# Patient Record
Sex: Male | Born: 1935 | Race: White | Hispanic: No | Marital: Married | Smoking: Former smoker
Health system: Southern US, Community
[De-identification: ages and names within clinical notes are randomized; demographics above are authoritative.]

## PROBLEM LIST (undated history)

## (undated) DIAGNOSIS — K3182 Dieulafoy lesion (hemorrhagic) of stomach and duodenum: Secondary | ICD-10-CM

## (undated) DIAGNOSIS — I10 Essential (primary) hypertension: Secondary | ICD-10-CM

## (undated) DIAGNOSIS — C4490 Unspecified malignant neoplasm of skin, unspecified: Secondary | ICD-10-CM

## (undated) DIAGNOSIS — Z9289 Personal history of other medical treatment: Secondary | ICD-10-CM

## (undated) DIAGNOSIS — IMO0002 Reserved for concepts with insufficient information to code with codable children: Secondary | ICD-10-CM

## (undated) DIAGNOSIS — I8393 Asymptomatic varicose veins of bilateral lower extremities: Secondary | ICD-10-CM

## (undated) DIAGNOSIS — C649 Malignant neoplasm of unspecified kidney, except renal pelvis: Secondary | ICD-10-CM

## (undated) DIAGNOSIS — D649 Anemia, unspecified: Secondary | ICD-10-CM

## (undated) DIAGNOSIS — I2510 Atherosclerotic heart disease of native coronary artery without angina pectoris: Secondary | ICD-10-CM

## (undated) DIAGNOSIS — C801 Malignant (primary) neoplasm, unspecified: Secondary | ICD-10-CM

## (undated) DIAGNOSIS — Z905 Acquired absence of kidney: Secondary | ICD-10-CM

## (undated) DIAGNOSIS — R7989 Other specified abnormal findings of blood chemistry: Secondary | ICD-10-CM

## (undated) DIAGNOSIS — M1990 Unspecified osteoarthritis, unspecified site: Secondary | ICD-10-CM

## (undated) HISTORY — DX: Malignant (primary) neoplasm, unspecified: C80.1

## (undated) HISTORY — PX: NEPHRECTOMY: SHX65

## (undated) HISTORY — PX: COLONOSCOPY: SHX174

---

## 1960-03-31 DIAGNOSIS — A159 Respiratory tuberculosis unspecified: Secondary | ICD-10-CM

## 1960-03-31 HISTORY — DX: Respiratory tuberculosis unspecified: A15.9

## 1998-09-14 ENCOUNTER — Ambulatory Visit (HOSPITAL_COMMUNITY): Admission: RE | Admit: 1998-09-14 | Discharge: 1998-09-14 | Payer: Self-pay | Admitting: Radiology

## 1998-09-14 ENCOUNTER — Encounter: Payer: Self-pay | Admitting: Radiology

## 1998-10-24 ENCOUNTER — Encounter: Payer: Self-pay | Admitting: Radiology

## 1998-10-24 ENCOUNTER — Ambulatory Visit (HOSPITAL_COMMUNITY): Admission: RE | Admit: 1998-10-24 | Discharge: 1998-10-24 | Payer: Self-pay | Admitting: Radiology

## 1999-07-09 ENCOUNTER — Encounter: Payer: Self-pay | Admitting: Neurological Surgery

## 1999-07-09 ENCOUNTER — Ambulatory Visit (HOSPITAL_COMMUNITY): Admission: RE | Admit: 1999-07-09 | Discharge: 1999-07-09 | Payer: Self-pay | Admitting: *Deleted

## 1999-07-24 ENCOUNTER — Ambulatory Visit (HOSPITAL_COMMUNITY): Admission: RE | Admit: 1999-07-24 | Discharge: 1999-07-24 | Payer: Self-pay | Admitting: *Deleted

## 1999-07-24 ENCOUNTER — Encounter: Payer: Self-pay | Admitting: Neurological Surgery

## 1999-11-13 ENCOUNTER — Encounter: Admission: RE | Admit: 1999-11-13 | Discharge: 1999-11-13 | Payer: Self-pay | Admitting: *Deleted

## 1999-11-13 ENCOUNTER — Encounter (HOSPITAL_BASED_OUTPATIENT_CLINIC_OR_DEPARTMENT_OTHER): Payer: Self-pay | Admitting: Internal Medicine

## 1999-12-05 ENCOUNTER — Ambulatory Visit (HOSPITAL_COMMUNITY): Admission: RE | Admit: 1999-12-05 | Discharge: 1999-12-05 | Payer: Self-pay | Admitting: Gastroenterology

## 2002-02-11 ENCOUNTER — Ambulatory Visit (HOSPITAL_BASED_OUTPATIENT_CLINIC_OR_DEPARTMENT_OTHER): Admission: RE | Admit: 2002-02-11 | Discharge: 2002-02-11 | Payer: Self-pay | Admitting: Orthopedic Surgery

## 2004-03-31 HISTORY — PX: CORONARY ANGIOPLASTY WITH STENT PLACEMENT: SHX49

## 2005-05-15 ENCOUNTER — Ambulatory Visit (HOSPITAL_COMMUNITY): Admission: RE | Admit: 2005-05-15 | Discharge: 2005-05-16 | Payer: Self-pay | Admitting: Cardiology

## 2006-04-07 ENCOUNTER — Encounter: Admission: RE | Admit: 2006-04-07 | Discharge: 2006-04-07 | Payer: Self-pay | Admitting: Neurology

## 2006-04-07 IMAGING — CR DG CERVICAL SPINE COMPLETE 4+V
5 series · 5 of 5 positions shown · non-contrast
Comparison: none

CLINICAL DATA: C5-6 radiculopathy.
 CERVICAL SPINE ? 5 VIEW:

[w c-spine lat]
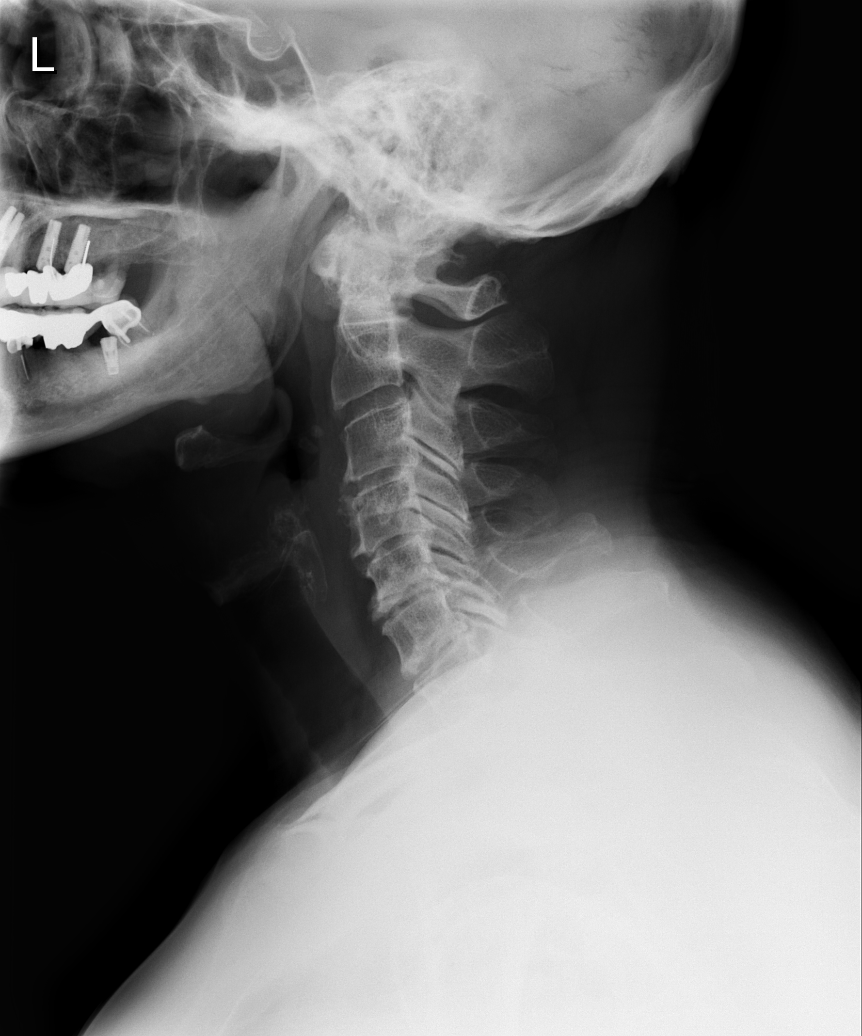

[w c-spine oblique (1 of 2)]
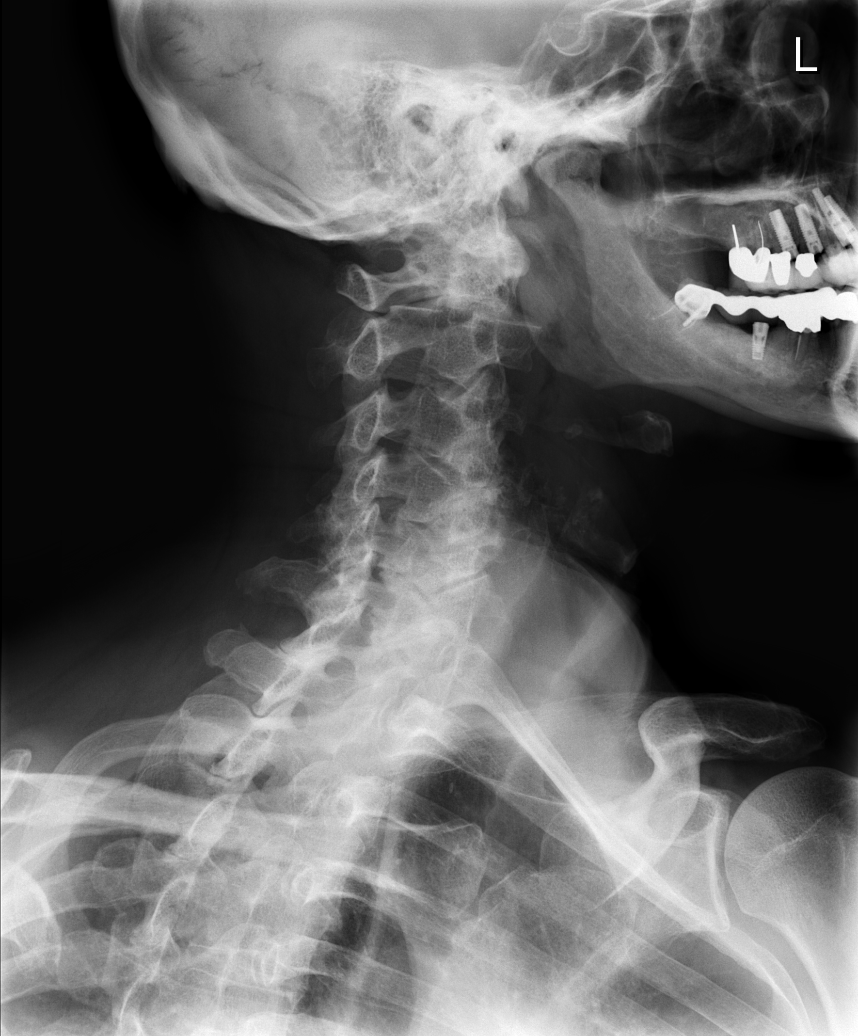

[w c-spine oblique (2 of 2)]
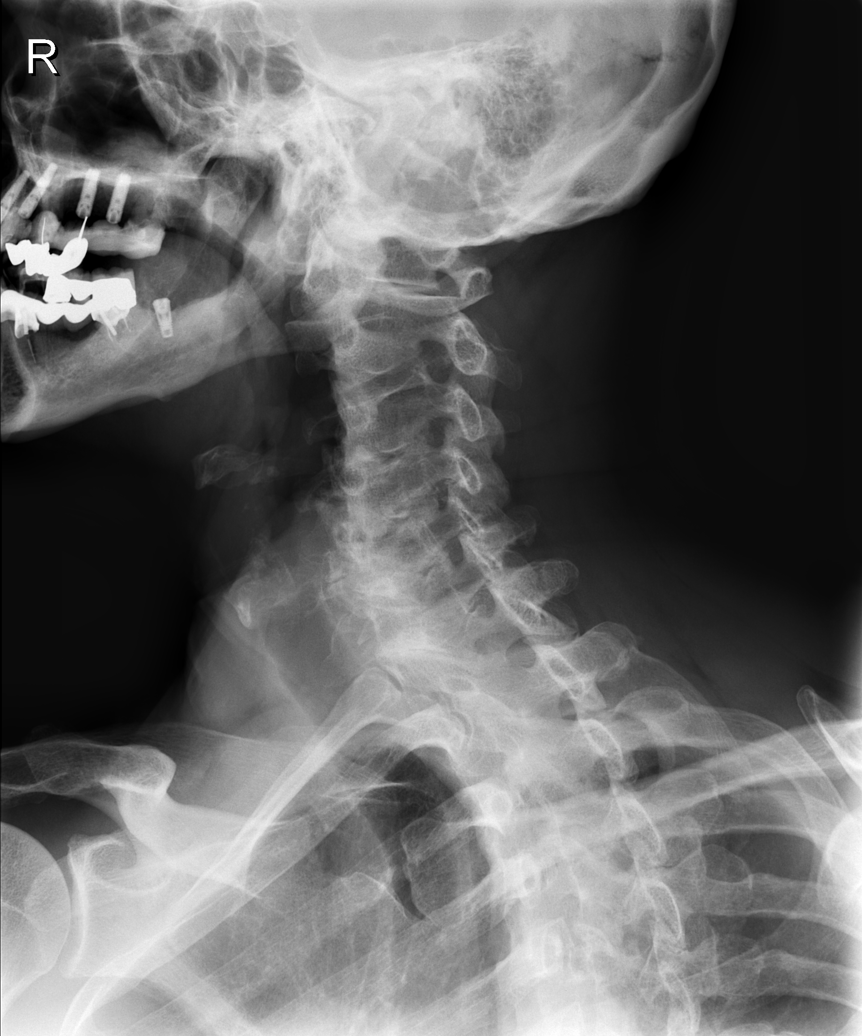

[w c-spine a.p. *]
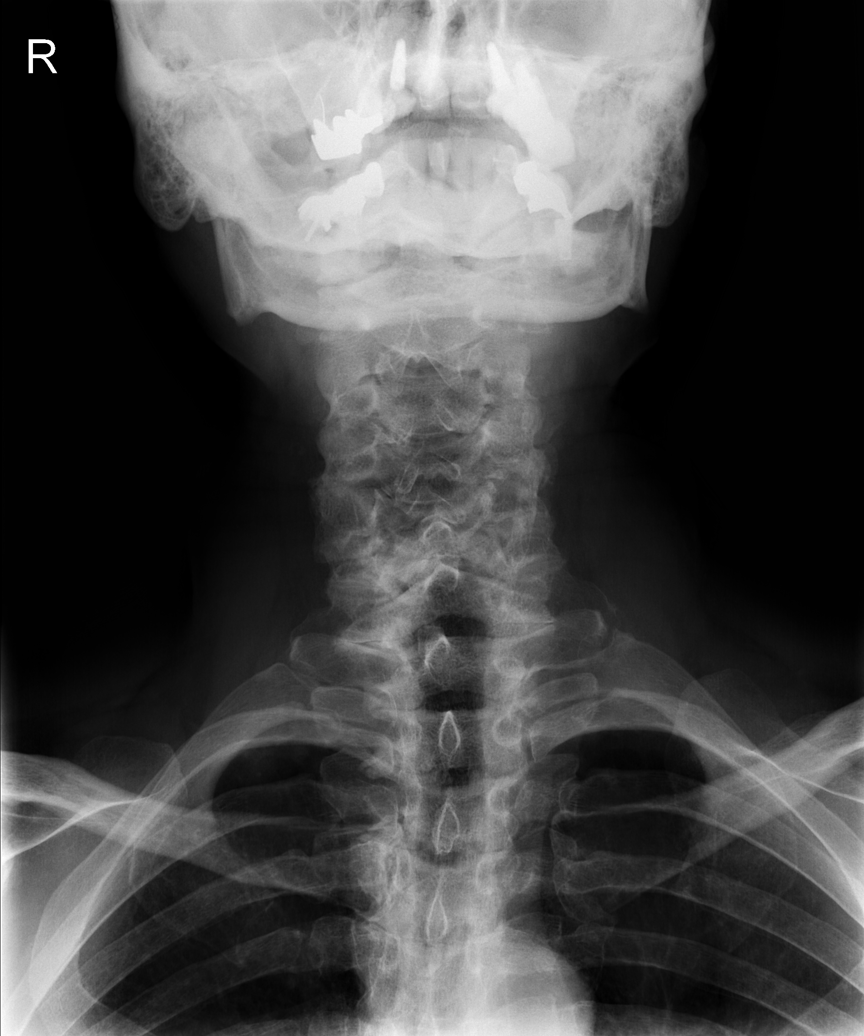

[w c-spine odontoid *]
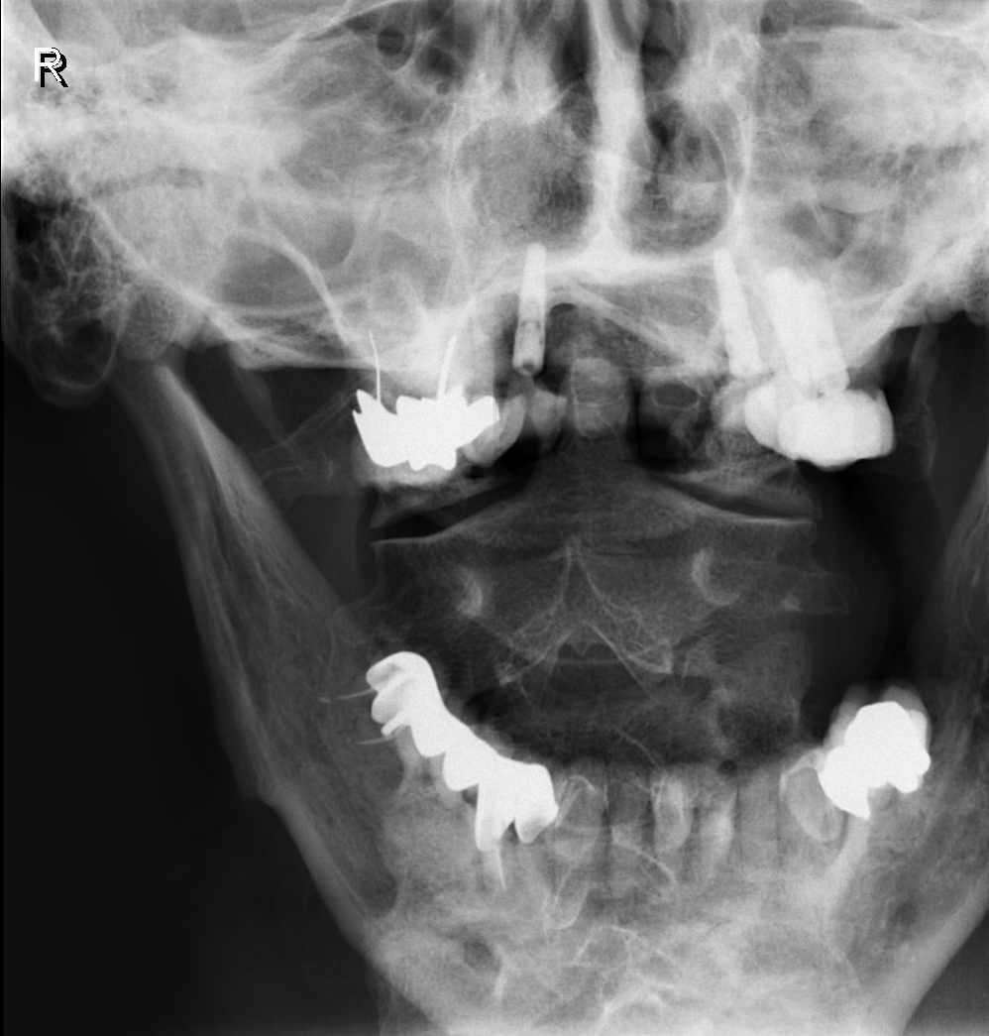

[5 of 5 positions shown; findings below may reference images not displayed]

FINDINGS: Five views of the cervical spine were obtained.  There is diffuse degenerative disc disease from C-3 to C-7 with loss of disc space, sclerosis, and spur formation.  On oblique views there is foraminal narrowing particularly at C4-5, C5-6, and C6-7 bilaterally.  The odontoid process is intact.
IMPRESSION: Diffuse degenerative disc disease from C-3 to C-4 with bilateral foraminal narrowing most marked at C4-5, C5-6, and C6-7.

## 2010-12-31 ENCOUNTER — Other Ambulatory Visit: Payer: Self-pay | Admitting: Dermatology

## 2011-04-14 ENCOUNTER — Ambulatory Visit (INDEPENDENT_AMBULATORY_CARE_PROVIDER_SITE_OTHER): Payer: Medicare Other | Admitting: General Surgery

## 2011-04-14 ENCOUNTER — Encounter (INDEPENDENT_AMBULATORY_CARE_PROVIDER_SITE_OTHER): Payer: Self-pay | Admitting: General Surgery

## 2011-04-14 VITALS — BP 150/68 | HR 72 | Temp 97.3°F | Resp 14 | Ht 66.0 in | Wt 186.0 lb

## 2011-04-14 DIAGNOSIS — K644 Residual hemorrhoidal skin tags: Secondary | ICD-10-CM | POA: Insufficient documentation

## 2011-04-14 NOTE — Progress Notes (Signed)
Patient ID: Glenn John., male   DOB: [birth year], 78 y.o.   MRN: [id number]  Chief Complaint  Patient presents with  . Other    hemorrhoids    HPI Glenn Hunt. is a 78 y.o. male.   HPI He is self-referred for evaluation of external hemorrhoids. Sometimes they are irritating and causing itching. Preparation H. helps with this. Sometimes they make it difficult for him to clean himself after a bowel movement. There is no bleeding and he has no significant pain from these. He is an avid cyclist. He is here to discuss options for treatment for this.  Past Medical History  Diagnosis Date  . Cancer     kidney  . Gout     Past Surgical History  Procedure Date  . Nephrectomy     History reviewed. No pertinent family history.  Social History History  Substance Use Topics  . Smoking status: Current Everyday Smoker  . Smokeless tobacco: Never Used  . Alcohol Use: Yes    Allergies  Allergen Reactions  . Tetracyclines & Related     Current Outpatient Prescriptions  Medication Sig Dispense Refill  . allopurinol (ZYLOPRIM) 100 MG tablet Take 150 mg by mouth daily.      [name] Kitchen aspirin 325 MG tablet Take 325 mg by mouth daily.      [name] Kitchen triamterene-hydrochlorothiazide (MAXZIDE) 75-50 MG per tablet Take 1 tablet by mouth daily.        Review of Systems Review of Systems  Constitutional: Negative for unexpected weight change.  HENT: Positive for hearing loss and congestion.     Blood pressure 150/68, pulse 72, temperature 97.3 F (36.3 C), temperature source Temporal, resp. rate 14, height 5\' 6"  (1.676 m), weight 186 lb (84.369 kg).  Physical Exam Physical Exam  Constitutional: He appears well-developed and well-nourished. No distress.  Genitourinary:       There are external hemorrhoids present in the right anterior and left lateral positions. No evidence of thrombosis. No fissure. The hemorrhoids are nontender. No masses or blood on digital rectal exam.    Data  Reviewed none  Assessment    External hemorrhoids that intermittently itch and caused difficulty with hygiene at times.    Plan    We discussed options for this. The radical option, in my opinion, would be external hemorrhoidectomy and he is not particularly interested in that at this time. We also talked about a sitz bath apparatus, a bidet, or continuing with his current method of hygiene. He has decided to continue as he currently is and will call back if he would like to reconsider other modes of treatment.      Dorothy Polhemus J 04/14/2011, 10:33 AM

## 2011-04-14 NOTE — Patient Instructions (Signed)
Call if you have any further problems.

## 2011-07-09 ENCOUNTER — Encounter (INDEPENDENT_AMBULATORY_CARE_PROVIDER_SITE_OTHER): Payer: Self-pay

## 2011-11-14 ENCOUNTER — Other Ambulatory Visit: Payer: Self-pay | Admitting: Dermatology

## 2011-12-04 ENCOUNTER — Other Ambulatory Visit: Payer: Self-pay | Admitting: Dermatology

## 2012-03-09 ENCOUNTER — Other Ambulatory Visit: Payer: Self-pay | Admitting: Dermatology

## 2012-04-20 ENCOUNTER — Encounter (HOSPITAL_COMMUNITY): Payer: Self-pay | Admitting: Emergency Medicine

## 2012-04-20 ENCOUNTER — Inpatient Hospital Stay (HOSPITAL_COMMUNITY)
Admission: EM | Admit: 2012-04-20 | Discharge: 2012-04-21 | DRG: 378 | Disposition: A | Discharge status: Home / Self Care | Payer: Medicare Other | Attending: Internal Medicine | Admitting: Internal Medicine

## 2012-04-20 ENCOUNTER — Encounter (HOSPITAL_COMMUNITY)
Admission: EM | Disposition: A | Discharge status: Home / Self Care | Payer: Self-pay | Source: Home / Self Care | Attending: Internal Medicine

## 2012-04-20 ENCOUNTER — Emergency Department (HOSPITAL_COMMUNITY): Payer: Medicare Other

## 2012-04-20 DIAGNOSIS — Z9861 Coronary angioplasty status: Secondary | ICD-10-CM

## 2012-04-20 DIAGNOSIS — M129 Arthropathy, unspecified: Secondary | ICD-10-CM | POA: Diagnosis present

## 2012-04-20 DIAGNOSIS — Z7982 Long term (current) use of aspirin: Secondary | ICD-10-CM

## 2012-04-20 DIAGNOSIS — Z85528 Personal history of other malignant neoplasm of kidney: Secondary | ICD-10-CM

## 2012-04-20 DIAGNOSIS — F17200 Nicotine dependence, unspecified, uncomplicated: Secondary | ICD-10-CM | POA: Diagnosis present

## 2012-04-20 DIAGNOSIS — K3182 Dieulafoy lesion (hemorrhagic) of stomach and duodenum: Principal | ICD-10-CM | POA: Diagnosis present

## 2012-04-20 DIAGNOSIS — R079 Chest pain, unspecified: Secondary | ICD-10-CM | POA: Insufficient documentation

## 2012-04-20 DIAGNOSIS — E876 Hypokalemia: Secondary | ICD-10-CM | POA: Diagnosis present

## 2012-04-20 DIAGNOSIS — Z905 Acquired absence of kidney: Secondary | ICD-10-CM | POA: Insufficient documentation

## 2012-04-20 DIAGNOSIS — D649 Anemia, unspecified: Secondary | ICD-10-CM | POA: Diagnosis present

## 2012-04-20 DIAGNOSIS — N183 Chronic kidney disease, stage 3 unspecified: Secondary | ICD-10-CM | POA: Diagnosis present

## 2012-04-20 DIAGNOSIS — K922 Gastrointestinal hemorrhage, unspecified: Secondary | ICD-10-CM | POA: Diagnosis present

## 2012-04-20 DIAGNOSIS — D62 Acute posthemorrhagic anemia: Secondary | ICD-10-CM | POA: Diagnosis present

## 2012-04-20 DIAGNOSIS — Z79899 Other long term (current) drug therapy: Secondary | ICD-10-CM

## 2012-04-20 DIAGNOSIS — I2510 Atherosclerotic heart disease of native coronary artery without angina pectoris: Secondary | ICD-10-CM | POA: Diagnosis present

## 2012-04-20 DIAGNOSIS — M109 Gout, unspecified: Secondary | ICD-10-CM | POA: Diagnosis present

## 2012-04-20 HISTORY — DX: Atherosclerotic heart disease of native coronary artery without angina pectoris: I25.10

## 2012-04-20 HISTORY — PX: ESOPHAGOGASTRODUODENOSCOPY: SHX5428

## 2012-04-20 LAB — CBC
HCT: 19.2 % — ABNORMAL LOW (ref 39.0–52.0)
HCT: 19.2 % — ABNORMAL LOW (ref 39.0–52.0)
Hemoglobin: 5.3 g/dL — CL (ref 13.0–17.0)
Hemoglobin: 6.7 g/dL — CL (ref 13.0–17.0)
MCH: 32.8 pg (ref 26.0–34.0)
MCHC: 34.9 g/dL (ref 30.0–36.0)
MCV: 102 fL — ABNORMAL HIGH (ref 78.0–100.0)
MCV: 94.1 fL (ref 78.0–100.0)
Platelets: 163 10*3/uL (ref 150–400)
RBC: 1.53 MIL/uL — ABNORMAL LOW (ref 4.22–5.81)
RBC: 2.04 MIL/uL — ABNORMAL LOW (ref 4.22–5.81)
RDW: 18.1 % — ABNORMAL HIGH (ref 11.5–15.5)
WBC: 9.5 10*3/uL (ref 4.0–10.5)

## 2012-04-20 LAB — PREPARE RBC (CROSSMATCH)

## 2012-04-20 LAB — BASIC METABOLIC PANEL
CO2: 25 mEq/L (ref 19–32)
Calcium: 8.6 mg/dL (ref 8.4–10.5)
Chloride: 103 mEq/L (ref 96–112)
Potassium: 3 mEq/L — ABNORMAL LOW (ref 3.5–5.1)
Sodium: 137 mEq/L (ref 135–145)

## 2012-04-20 LAB — MRSA PCR SCREENING: MRSA by PCR: NEGATIVE

## 2012-04-20 LAB — POCT I-STAT TROPONIN I: Troponin i, poc: 0.03 ng/mL (ref 0.00–0.08)

## 2012-04-20 LAB — TSH: TSH: 1.915 u[IU]/mL (ref 0.350–4.500)

## 2012-04-20 LAB — OCCULT BLOOD, POC DEVICE: Fecal Occult Bld: POSITIVE — AB

## 2012-04-20 LAB — ABO/RH: ABO/RH(D): O POS

## 2012-04-20 IMAGING — CR DG CHEST 1V PORT
1 series · 1 of 1 positions shown · non-contrast
Comparison: None.

CLINICAL DATA: Cough and shortness of breath.

PORTABLE CHEST - 1 VIEW

[AP]
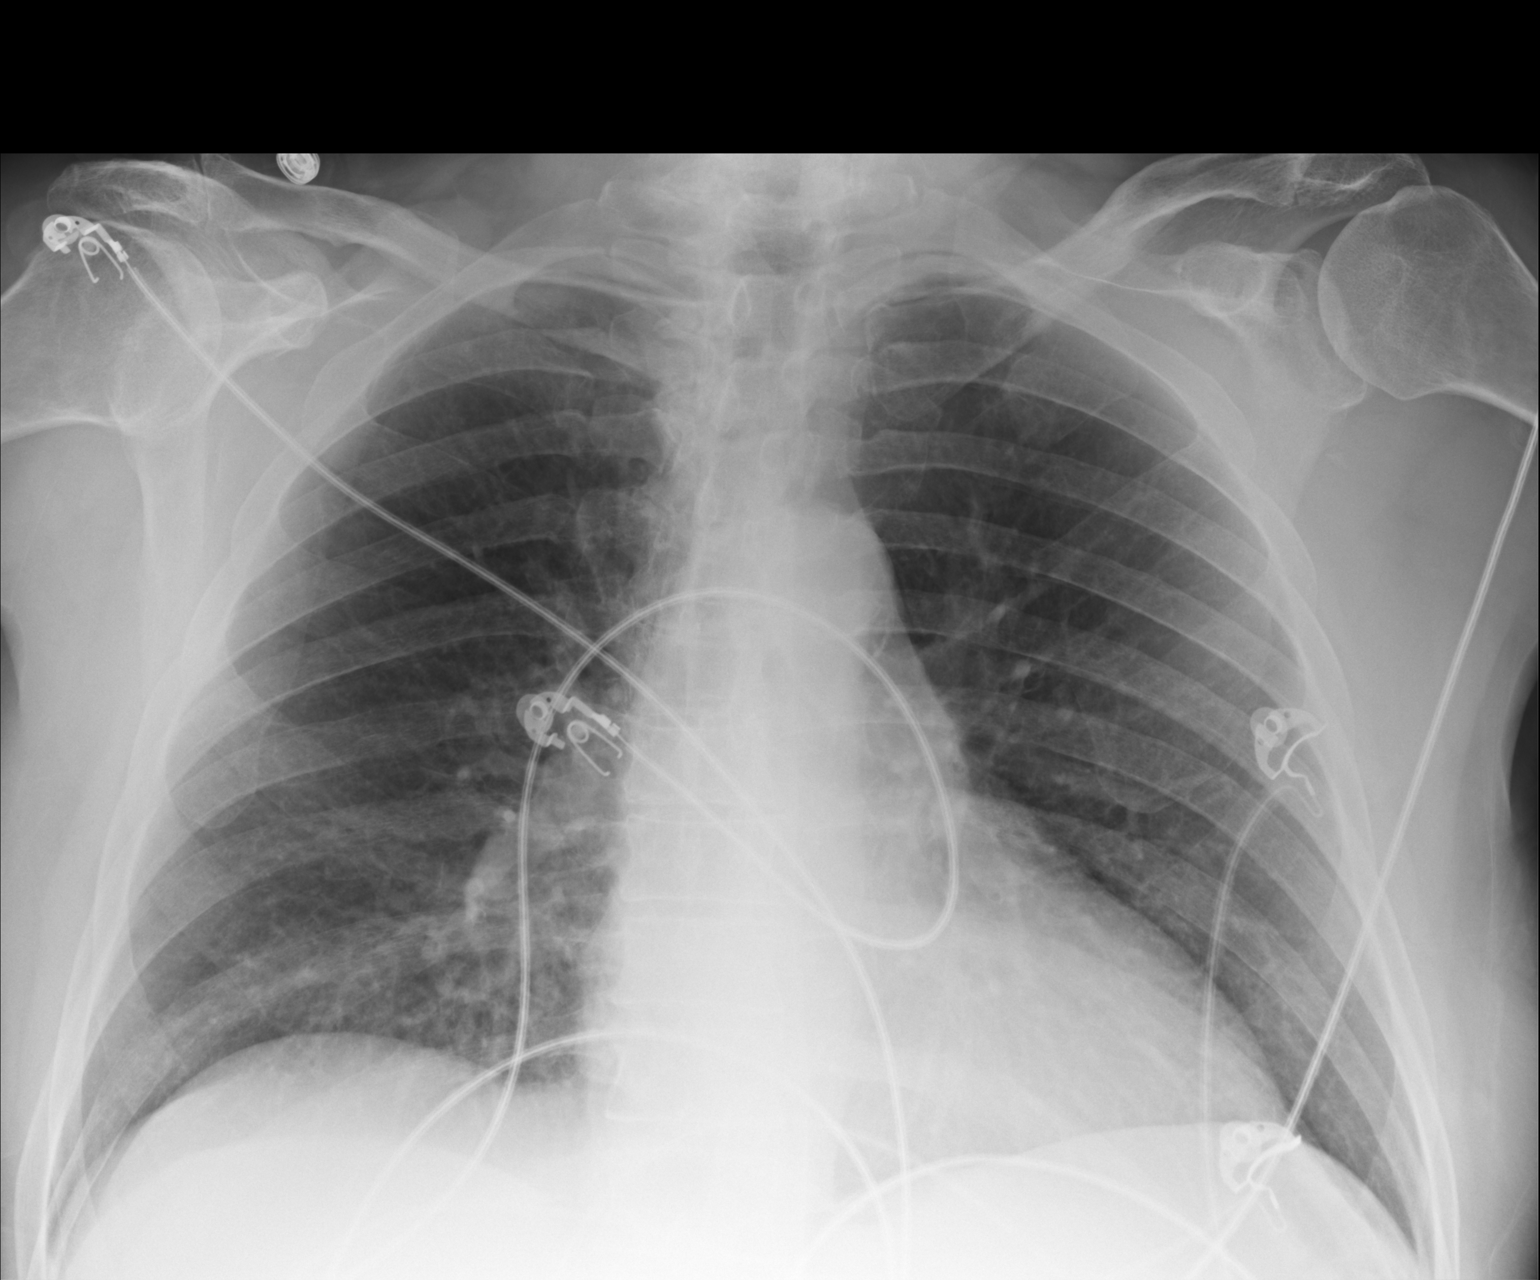

[1 of 1 positions shown; findings below may reference images not displayed]

FINDINGS: Single view of the chest was obtained.  There may be
mild peribronchial thickening or even bronchiectasis in the medial
right lung base.  Otherwise, lungs are clear.  Heart size is
normal.  Trachea is midline.
IMPRESSION: Question some bronchiectasis at the right lung base.
No focal airspace disease.

## 2012-04-20 SURGERY — EGD (ESOPHAGOGASTRODUODENOSCOPY)
Anesthesia: Moderate Sedation

## 2012-04-20 MED ORDER — BUTAMBEN-TETRACAINE-BENZOCAINE 2-2-14 % EX AERO
INHALATION_SPRAY | CUTANEOUS | Status: DC | PRN
  Administered 2012-04-20: 2 via TOPICAL

## 2012-04-20 MED ORDER — SODIUM CHLORIDE 0.9 % IV SOLN: INTRAVENOUS | Status: DC

## 2012-04-20 MED ORDER — DEXTROSE-NACL 5-0.9 % IV SOLN
INTRAVENOUS | Status: DC
  Administered 2012-04-20: 07:00:00 via INTRAVENOUS
  Administered 2012-04-20: 1000 mL via INTRAVENOUS
  Administered 2012-04-21: 01:00:00 via INTRAVENOUS

## 2012-04-20 MED ORDER — FENTANYL CITRATE 0.05 MG/ML IJ SOLN
INTRAMUSCULAR | Status: DC | PRN
  Administered 2012-04-20 (×4): 25 ug via INTRAVENOUS

## 2012-04-20 MED ORDER — EPINEPHRINE HCL 0.1 MG/ML IJ SOLN
INTRAMUSCULAR | Status: AC
  Filled 2012-04-20: qty 10

## 2012-04-20 MED ORDER — DIPHENHYDRAMINE HCL 50 MG/ML IJ SOLN
INTRAMUSCULAR | Status: AC
  Filled 2012-04-20: qty 1

## 2012-04-20 MED ORDER — SODIUM CHLORIDE 0.9 % IV SOLN
8.0000 mg/h | INTRAVENOUS | Status: DC
  Administered 2012-04-20 – 2012-04-21 (×3): 8 mg/h via INTRAVENOUS
  Filled 2012-04-20 (×6): qty 80

## 2012-04-20 MED ORDER — ONDANSETRON HCL 4 MG/2ML IJ SOLN: 4.0000 mg | Freq: Four times a day (QID) | INTRAMUSCULAR | Status: DC | PRN

## 2012-04-20 MED ORDER — POTASSIUM CHLORIDE 10 MEQ/100ML IV SOLN
10.0000 meq | INTRAVENOUS | Status: AC
  Administered 2012-04-20 (×4): 10 meq via INTRAVENOUS
  Filled 2012-04-20 (×4): qty 100

## 2012-04-20 MED ORDER — FENTANYL CITRATE 0.05 MG/ML IJ SOLN
INTRAMUSCULAR | Status: AC
  Filled 2012-04-20: qty 2

## 2012-04-20 MED ORDER — SODIUM CHLORIDE 0.9 % IV SOLN
80.0000 mg | Freq: Once | INTRAVENOUS | Status: AC
  Administered 2012-04-20: 80 mg via INTRAVENOUS
  Filled 2012-04-20: qty 80

## 2012-04-20 MED ORDER — ONDANSETRON HCL 4 MG PO TABS: 4.0000 mg | ORAL_TABLET | Freq: Four times a day (QID) | ORAL | Status: DC | PRN

## 2012-04-20 MED ORDER — SODIUM CHLORIDE 0.9 % IJ SOLN
3.0000 mL | Freq: Two times a day (BID) | INTRAMUSCULAR | Status: DC
  Administered 2012-04-20: 10 mL via INTRAVENOUS
  Administered 2012-04-21: 3 mL via INTRAVENOUS

## 2012-04-20 MED ORDER — EPINEPHRINE HCL 1 MG/ML IJ SOLN
INTRAMUSCULAR | Status: DC | PRN
  Administered 2012-04-20: 13:00:00

## 2012-04-20 MED ORDER — MIDAZOLAM HCL 10 MG/2ML IJ SOLN
INTRAMUSCULAR | Status: DC | PRN
  Administered 2012-04-20 (×4): 2 mg via INTRAVENOUS

## 2012-04-20 MED ORDER — MIDAZOLAM HCL 5 MG/ML IJ SOLN
INTRAMUSCULAR | Status: AC
  Filled 2012-04-20: qty 2

## 2012-04-20 NOTE — Consult Note (Signed)
Subjective:   HPI The patient is a 79 year old male who was on vacation and started to feel very weak. On returning to [facility] he came to the emergency room because of weakness and he noticed a large black tarry stool. He is a retired Development worker, community. He realize what this was. He states that he has noticed some dark stools for a while. He was found to have a hemoglobin of 5.3. He was admitted to the hospital. He denied hematemesis. He has no complaints of epigastric pain or heartburn. He has never had an ulcer to his knowledge.  Review of Systems Complains of weakness and some dyspnea  Past Medical History  Diagnosis Date  . Cancer     kidney  . Gout    Past Surgical History  Procedure Date  . Nephrectomy   . Coronary angioplasty with stent placement 2006   History   Social History  . Marital Status: Married    Spouse Name: N/A    Number of Children: N/A  . Years of Education: N/A   Occupational History  . Not on file.   Social History Main Topics  . Smoking status: Current Every Day Smoker -- 0.2 packs/day    Types: Cigarettes  . Smokeless tobacco: Never Used  . Alcohol Use: 4.2 oz/week    7 Glasses of wine per week  . Drug Use: No  . Sexually Active: Yes   Other Topics Concern  . Not on file   Social History Narrative  . No narrative on file   family history is not on file. Current facility-administered medications:dextrose 5 %-0.9 % sodium chloride infusion, , Intravenous, Continuous, Houston Siren, MD, Last Rate: 100 mL/hr at 04/20/12 0641;  ondansetron [facility]) injection 4 mg, 4 mg, Intravenous, Q6H PRN, Houston Siren, MD;  ondansetron [facility]) tablet 4 mg, 4 mg, Oral, Q6H PRN, Houston Siren, MD pantoprazole (PROTONIX) 80 mg in sodium chloride 0.9 % 250 mL infusion, 8 mg/hr, Intravenous, Continuous, John-Adam Bonk, MD, Last Rate: 25 mL/hr at 04/20/12 0332, 8 mg/hr at 04/20/12 0332;  potassium chloride 10 mEq in 100 mL IVPB, 10 mEq, Intravenous, Q1 Hr x 4, Lillia Mountain, MD, 10  mEq at 04/20/12 0859;  sodium chloride 0.9 % injection 3 mL, 3 mL, Intravenous, Q12H, Houston Siren, MD Allergies  Allergen Reactions  . Tetracyclines & Related Other (See Comments)    Infection of penis     Objective:     BP 131/53  Pulse 75  Temp 98.2 F (36.8 C) (Oral)  Resp 20  Ht 5\' 7"  (1.702 m)  Wt 85.1 kg (187 lb 9.8 oz)  BMI 29.38 kg/m2  SpO2 99%  He is in no acute distress  Nonicteric  Heart regular rhythm no murmurs  Lungs clear  Abdomen is soft and nontender without hepatosplenomegaly  Laboratory No components found with this basename: d1      Assessment:     #1. Upper gastrointestinal bleeding characterized by melena  #2. Anemia secondary to upper GI bleed      Plan:     Transfuse blood  PPI therapy  EGD later today Lab Results  Component Value Date   HGB 6.7* 04/20/2012   HGB 5.3* 04/20/2012   HCT 19.2* 04/20/2012   HCT 15.6* 04/20/2012

## 2012-04-20 NOTE — Progress Notes (Signed)
Had difficulty with module operation at intition of blood. Blood administration slowed.  Demi Trieu, [facility]

## 2012-04-20 NOTE — ED Notes (Signed)
Patient transported by [facility] with complaint of tarry stools.  Patient states tarry stools started on Friday but became more frequent today.  Patient just returned from [place] yesterday morning.  Patient also complaining being dizzy and lightheaded for the last couple days.  EMS reports patient was orthostatic with 152/58 lying and 106/54 standing.

## 2012-04-20 NOTE — Op Note (Signed)
[facility] [address] [place], [address]   ENDOSCOPY PROCEDURE REPORT  PATIENT: [name], [name]  DR.  Montez Hageman  MR#: [id number] BIRTHDATE: December 23, 1931 , 80  yrs. old GENDER: Male ENDOSCOPIST: Wandalee Ferdinand, MD REFERRED BY: PROCEDURE DATE:  04/20/2012 PROCEDURE:   EGD with epinephrine injection and Endo Clip of a Dieulofoy lesion ASA CLASS: 3 INDICATIONS: melena, anemia MEDICATIONS: fentanyl 100 mcg IV, Versed 8 mg IV TOPICAL ANESTHETIC: Cetacaine spray  DESCRIPTION OF PROCEDURE:   After the risks benefits and alternatives of the procedure were thoroughly explained, informed consent was obtained.  The Pentax Gastroscope [id number]  endoscope was introduced through the mouth and advanced to the second portion of the duodenum      , limited by Without limitations.   The instrument was slowly withdrawn as the mucosa was fully examined.      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 The area was washed several times but blood continued to ooze. 2 cc of 1-10,000 epinephrine was injected into this area with adequate blanching of the surrounding mucosa and with cessation of the bleeding. An Endo Clip was then placed at this site to clamp the area. Bleeding stopped (see figure 10).  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   RECOMMENDATIONS: observe for further bleeding      _______________________________ Rhodia Albright, MD 04/20/2012 1:33 PM       PATIENT NAME:  Glenn Hunt  DR.  Montez Hageman MR#: [id number]

## 2012-04-20 NOTE — Progress Notes (Signed)
Utilization Review Completed.  04/20/2012  

## 2012-04-20 NOTE — ED Provider Notes (Signed)
History     CSN: [id number]  Arrival date & time 04/20/12  0019   First MD Initiated Contact with Patient 04/20/12 [phone]      Chief Complaint  Patient presents with  . Melena  . Chest Pain    (Consider location/radiation/quality/duration/timing/severity/associated sxs/prior treatment) HPIMurphy F Samuel Mcpeek. 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 Patient recently got back from a [place]. Patient arrived on last Wednesday evening and didn't feel well noticed some nausea and vomiting after ascending the stairs at the hotel also some occasional shortness of breath on exertion and chest pain that resolved on rest. He had gradually worsening weakness through the weekend and did not have any more indigestion or nausea or vomiting. Patient noticed he was more orthostatic and dizzy on standing over the course of the weekend and today. Patient had some occasional black stools and had a very large black bowel movement today. No hematemesis.    Gastroenterologist is Dr. Matthias Hughs - last scope 2.5 years ago.  Past Medical History  Diagnosis Date  . Cancer     kidney  . Gout     Past Surgical History  Procedure Date  . Nephrectomy   . Coronary angioplasty with stent placement 2006    No family history on file.  History  Substance Use Topics  . Smoking status: Current Every Day Smoker  . Smokeless tobacco: Never Used  . Alcohol Use: Yes      Review of Systems At least 10pt or greater review of systems completed and are negative except where specified in the HPI.  Allergies  Tetracyclines & related  Home Medications   Current Outpatient Rx  Name  Route  Sig  Dispense  Refill  . ALLOPURINOL 100 MG PO TABS   Oral   Take 150 mg by mouth daily.         . ASPIRIN 325 MG PO TABS   Oral   Take 325 mg by mouth daily.         . TRIAMTERENE-HCTZ 75-50 MG PO TABS   Oral   Take 1  tablet by mouth daily.           BP 118/80  Pulse 100  Temp 98.7 F (37.1 C) (Oral)  Resp 20  Ht 5\' 7"  (1.702 m)  Wt 182 lb (82.555 kg)  BMI 28.51 kg/m2  SpO2 98%  Physical Exam  Nursing notes reviewed.  Electronic medical record reviewed. VITAL SIGNS:   Filed Vitals:   04/20/12 0330 04/20/12 0345 04/20/12 0400 04/20/12 0415  BP: 122/48 134/60 141/54 126/52  Pulse: 92 100 99 90  Temp:      TempSrc:      Resp: 20 20 19 23   Height:      Weight:      SpO2:       CONSTITUTIONAL: Awake, oriented, appears non-toxic HENT: Atraumatic, normocephalic, oral mucosa pink and moist, airway patent. Nares patent without drainage. External ears normal. EYES: Conjunctiva clear- pale palpebral conjunctiva, EOMI, PERRLA NECK: Trachea midline, non-tender, supple CARDIOVASCULAR: Normal heart rate, Normal rhythm, late systolic murmur at the left sternal border, No rubs, gallops PULMONARY/CHEST: Good air movement bilaterally with scarce and expiratory wheezes. Symmetrical breath sounds. Non-tender. ABDOMINAL: Non-distended, soft, non-tender - no rebound or guarding.  BS normal. NEUROLOGIC: Non-focal, moving all four extremities, no gross sensory or motor deficits. Rectal: Normal tone, black stool EXTREMITIES:  No clubbing, cyanosis. 1+ pitting edema in the lower extremities SKIN: Warm, Dry, No erythema, No rash   ED Course  CRITICAL CARE Performed by: Jones Skene Authorized by: Jones Skene Total critical care time: 30 minutes Critical care time was exclusive of separately billable procedures and treating other patients. Critical care was necessary to treat or prevent imminent or life-threatening deterioration of the following conditions: Symptomatic anemia from upper GI bleed, coronary ischemia. Critical care was time spent personally by me on the following activities: examination of patient, ordering and performing treatments and interventions, ordering and review of radiographic  studies, re-evaluation of patient's condition, review of old charts, pulse oximetry, ordering and review of laboratory studies, obtaining history from patient or surrogate, evaluation of patient's response to treatment and discussions with consultants.   (including critical care time)  Date: 04/20/2012  Rate: 105  Rhythm: Sinus tachycardia  QRS Axis: normal  Intervals: normal  ST/T Wave abnormalities: ST depressions in 1 to V4 V5 V6  Conduction Disutrbances: none  Narrative Interpretation: Probable lateral ischemia     Labs Reviewed  BASIC METABOLIC PANEL - Abnormal; Notable for the following:    Potassium 3.0 (*)     Glucose, Bld 125 (*)     BUN 61 (*)     Creatinine, Ser 1.58 (*)     GFR calc non Af Amer 40 (*)     GFR calc Af Amer 47 (*)     All other components within normal limits  CBC - Abnormal; Notable for the following:    RBC 1.53 (*)     Hemoglobin 5.3 (*)     HCT 15.6 (*)     MCV 102.0 (*)     MCH 34.6 (*)     RDW 15.7 (*)     All other components within normal limits  OCCULT BLOOD, POC DEVICE - Abnormal; Notable for the following:    Fecal Occult Bld POSITIVE (*)     All other components within normal limits  TROPONIN I  TYPE AND SCREEN  POCT I-STAT TROPONIN I  PREPARE RBC (CROSSMATCH)  ABO/RH  APTT  PROTIME-INR   Dg Chest Port 1 View  04/20/2012  *RADIOLOGY REPORT*  Clinical Data: Cough and shortness of breath.  PORTABLE CHEST - 1 VIEW  Comparison: None.  Findings:   Single view of the chest was obtained.  There may be mild peribronchial thickening or even bronchiectasis in the medial right lung base.  Otherwise, lungs are clear.  Heart size is normal.  Trachea is midline.  IMPRESSION: Question some bronchiectasis at the right lung base. No focal airspace disease.   Original Report Authenticated By: Richarda Overlie, M.D.      1. Upper GI bleed   2. Anemia   3. Chest pain on exertion       MDM  Loki Wuthrich. is a 79 y.o. male returning from a  recent [place] whereby he was having some chest pain and shortness of breath also with some nausea and vomiting, patient's also had some melanotic stools. Patient has large amount of melena on physical exam. We'll treat patient's GI bleed.  Type and cross for 3. Patient's hemoglobin is critical at 5.3, patient is having difficulty even standing up because he is so orthostatic.  Given patient a small fluid bolus and will begin giving him 3 units of blood. Patient is only on aspirin-no other blood thinners noted.  Fecal occult blood is positive, troponin is negative. EKG  changes are consistent with symptomatic anemia - no evidence for end STEMI at this time. Resuscitation in progress. Patient is hemodynamically stable. Protonix drip started as well.    Discussed with Dr.Ganem from [facility] - to see in am.  Discussed with Dr. Conley Rolls from [facility] for admission.     Jones Skene, MD 04/20/12 [phone]

## 2012-04-20 NOTE — Progress Notes (Signed)
CRITICAL VALUE ALERT  Critical value received:  Troponin 0.33  Date of notification:  04-20-12  Time of notification:  1223  Critical value read back:yes  Nurse who received alert:  Paulino Rily  MD notified (1st page):  Valentina Lucks, MD  Time of first page:  1225  No new orders received.

## 2012-04-20 NOTE — H&P (Signed)
[facility] History and Physical  Ramondo Dietze. ZOX:[id number] DOB: 09/28/1931    PCP:   Lillia Mountain, MD   Chief Complaint: shortness of breath and chest pain.  HPI: Glenn Hunt. 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  He has been an avid cyclist and denied any prior exertional chest pain.  He has no black stool, or bloody stool, but had hx of external hemorrhoid.  Evaluation in the ER showed unremarkable EKG and negative troponin, but his Hb was found to be very low at 5.3 grams per DL. He has Cr of 1.6 with BUN of 61., but with K of 3.0 and Hospitalist was asked to admit him for upper GI bleed.   Rewiew of Systems:  Constitutional: Negative for malaise, fever and chills. No significant weight loss or weight gain Eyes: Negative for eye pain, redness and discharge, diplopia, visual changes, or flashes of light. ENMT: Negative for ear pain, hoarseness, nasal congestion, sinus pressure and sore throat. No headaches; tinnitus, drooling, or problem swallowing. Cardiovascular: Negative for palpitations, diaphoresis, dyspnea and peripheral edema. ; No orthopnea, PND Respiratory: Negative for cough, hemoptysis, wheezing and stridor. No pleuritic chestpain. Gastrointestinal: Negative for nausea, vomiting, diarrhea, constipation, abdominal pain, melena, blood in stool, hematemesis, jaundice and rectal bleeding.    Genitourinary: Negative for frequency, dysuria, incontinence,flank pain and hematuria; Musculoskeletal: Negative for back pain and neck pain. Negative for swelling and trauma.;  Skin: . Negative for pruritus, rash, abrasions, bruising and skin lesion.; ulcerations Neuro: Negative for headache, lightheadedness and neck stiffness. Negative for weakness, altered level of  consciousness , altered mental status, extremity weakness, burning feet, involuntary movement, seizure and syncope.  Psych: negative for anxiety, depression, insomnia, tearfulness, panic attacks, hallucinations, paranoia, suicidal or homicidal ideation   Past Medical History  Diagnosis Date  . Cancer     kidney  . Gout     Past Surgical History  Procedure Date  . Nephrectomy   . Coronary angioplasty with stent placement 2006    Medications:  HOME MEDS: Prior to Admission medications   Medication Sig Start Date End Date Taking? Authorizing Provider  allopurinol (ZYLOPRIM) 300 MG tablet Take 150 mg by mouth daily.   Yes Historical Provider, MD  aspirin 81 MG chewable tablet Chew 81 mg by mouth daily.   Yes Historical Provider, MD  triamterene-hydrochlorothiazide (MAXZIDE) 75-50 MG per tablet Take 1 tablet by mouth daily.   Yes Historical Provider, MD     Allergies:  Allergies  Allergen Reactions  . Tetracyclines & Related Other (See Comments)    Infection of penis    Social History:   reports that he has been smoking Cigarettes.  He has been smoking about .25 packs per day. He has never used smokeless tobacco. He reports that he drinks about 4.2 ounces of alcohol per week. He reports that he does not use illicit drugs.  Family History: History reviewed. No pertinent family history.   Physical Exam: Filed Vitals:   04/20/12 0515 04/20/12 0530 04/20/12 0545 04/20/12 0600  BP: 133/48 131/47 116/52 129/58  Pulse: 84 85 84 98  Temp:  98.9 F (37.2 C) 98.7 F (37.1 C) 98.6 F (37 C)  TempSrc:  Oral Oral Oral  Resp: 21 19 17 16   Height:      Weight:  SpO2:       Blood pressure 129/58, pulse 98, temperature 98.6 F (37 C), temperature source Oral, resp. rate 16, height 5\' 7"  (1.702 m), weight 82.555 kg (182 lb), SpO2 99.00%.  GEN:  Pleasant  patient lying in the stretcher in no acute distress; cooperative with exam. PSYCH:  alert and oriented x4; does not appear  anxious or depressed; affect is appropriate. HEENT: Mucous membranes pink and anicteric; PERRLA; EOM intact; no cervical lymphadenopathy nor thyromegaly or carotid bruit; no JVD; There were no stridor. Neck is very supple. Breasts:: Not examined CHEST WALL: No tenderness CHEST: Normal respiration, clear to auscultation bilaterally.  HEART: Regular rate and rhythm.  There are no murmur, rub, or gallops.   BACK: No kyphosis or scoliosis; no CVA tenderness ABDOMEN: soft and non-tender; no masses, no organomegaly, normal abdominal bowel sounds; no pannus; no intertriginous candida. There is no rebound and no distention. Rectal Exam: Not done EXTREMITIES: No bone or joint deformity; age-appropriate arthropathy of the hands and knees; no edema; no ulcerations.  There is no calf tenderness. Genitalia: not examined PULSES: 2+ and symmetric SKIN: Normal hydration no rash or ulceration CNS: Cranial nerves 2-12 grossly intact no focal lateralizing neurologic deficit.  Speech is fluent; uvula elevated with phonation, facial symmetry and tongue midline. DTR are normal bilaterally, cerebella exam is intact, barbinski is negative and strengths are equaled bilaterally.  No sensory loss.   Labs on Admission:  Basic Metabolic Panel:  Lab 04/20/12 9604  NA 137  K 3.0*  CL 103  CO2 25  GLUCOSE 125*  BUN 61*  CREATININE 1.58*  CALCIUM 8.6  MG --  PHOS --   Liver Function Tests: No results found for this basename: AST:5,ALT:5,ALKPHOS:5,BILITOT:5,PROT:5,ALBUMIN:5 in the last 168 hours No results found for this basename: LIPASE:5,AMYLASE:5 in the last 168 hours No results found for this basename: AMMONIA:5 in the last 168 hours CBC:  Lab 04/20/12 0052  WBC 9.5  NEUTROABS --  HGB 5.3*  HCT 15.6*  MCV 102.0*  PLT 163   Cardiac Enzymes:  Lab 04/20/12 0052  CKTOTAL --  CKMB --  CKMBINDEX --  TROPONINI <0.30    CBG: No results found for this basename: GLUCAP:5 in the last 168  hours   Radiological Exams on Admission: Dg Chest Port 1 View  04/20/2012  *RADIOLOGY REPORT*  Clinical Data: Cough and shortness of breath.  PORTABLE CHEST - 1 VIEW  Comparison: None.  Findings:   Single view of the chest was obtained.  There may be mild peribronchial thickening or even bronchiectasis in the medial right lung base.  Otherwise, lungs are clear.  Heart size is normal.  Trachea is midline.  IMPRESSION: Question some bronchiectasis at the right lung base. No focal airspace disease.   Original Report Authenticated By: Richarda Overlie, M.D.     EKG: Independently reviewed. No acute ST T changes   Assessment/Plan Present on Admission:  . Upper GI bleed . Anemia . Chest pain on exertion . External hemorrhoids   PLAN:  I will admit him to the SDU for closer monitoring.  He will be transfuse the first 2 units of PRBC.  GI has been consulted as per EDP.  I will made him NPO in case GI would like to scope him.  Will start on IV Protonix drip, and stop his ASA.  With respect to his chest pain, despite him having been very active, I think he should get a stress test as an outpatient, but I  will defer to his PCP Dr Valentina Lucks and him who is an internist himself.  He is stable, full code, and will be admitted to SDU.  Thank you for allowing me to partake in the care of your nice patient.  Other plans as per orders.  Code Status: FULL Unk Lightning, MD. [facility] Pager [phone] 7pm to 7am.  04/20/2012, 6:29 AM

## 2012-04-20 NOTE — Progress Notes (Signed)
Spoke with Dr. Valentina Lucks regarding pt Hgb of 6.7.  Orders received to transfuse 2 units of PRBCs.  Salomon Mast, RN

## 2012-04-20 NOTE — Consult Note (Signed)
CARDIOLOGY CONSULT NOTE  Patient ID: Glenn Glenn. MRN: [id number] DOB/AGE: 1931/06/22 79 y.o.  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 He has known coronary disease. In 2007 catheterization was done for inferior ischemia on a nuclear scan. There was a 60-70% ostial PDA lesion that was not approached. There was a 90% distal RCA lesion and a very large RCA. Patient received a vision stent ( 3.5 x 23 mm). There was an 80-90% small first diagonal. Wire could not be passed and the vessel was left alone. It was felt that the abnormality on the nuclear scan had been treated successfully with stent. The plan was to follow the patient medically over time.  The patient has remained very active. He rides a bicycle on a very regular basis. Recently he has noted some chest discomfort while riding his bike. He had not noticed any obvious change in his stool. He was traveling out of the country and felt increasing chest discomfort. He traveled home. Just before admission to the hospital on the day of arrival he had a large tarry stool. He was then admitted and his admission hemoglobin was 5.3. An endoscopy has shown a bleeding site in his stomach that has been treated. He has been transfused. He is not having any further chest discomfort.  The patient's EKG is abnormal. There is no old EKG in EPIC. After extensive review I was able to find the prior catheterization report in e-chart. However there was no old EKG there. The patient's first troponin  Less than 0.30. The second troponin is 0.33   Past Medical History  Diagnosis Date  . Cancer     kidney  . Gout     History reviewed. No pertinent family history.  History   Social History  . Marital Status: Married    Spouse Name: N/A    Number of Children: N/A  . Years of Education: N/A   Occupational History  . Not on file.    Social History Main Topics  . Smoking status: Current Every Day Smoker -- 0.2 packs/day    Types: Cigarettes  . Smokeless tobacco: Never Used  . Alcohol Use: 4.2 oz/week    7 Glasses of wine per week  . Drug Use: No  . Sexually Active: Yes   Other Topics Concern  . Not on file   Social History Narrative  . No narrative on file    Past Surgical History  Procedure Date  . Nephrectomy   . Coronary angioplasty with stent placement 2006     Prescriptions prior to admission  Medication Sig Dispense Refill  . allopurinol (ZYLOPRIM) 300 MG tablet Take 150 mg by mouth daily.      [name] Kitchen aspirin 81 MG chewable tablet Chew 81 mg by mouth daily.      [name] Kitchen triamterene-hydrochlorothiazide (MAXZIDE) 75-50 MG per tablet Take 1 tablet by mouth daily.       Review of systems: Patient denies fever, chills, headache, sweats, rash, change in vision, change in hearing, cough, nausea vomiting, urinary symptoms. All other systems are reviewed and are negative other than the history of present illness.   Physical Exam: Blood pressure 114/78, pulse 82, temperature 98.1 F (36.7 C), temperature source Oral, resp. rate 18, height 5\' 7"  (1.702 m), weight 187 lb 9.8 oz (85.1 kg), SpO2 99.00%.     The patient is quite stable. He is  oriented to person time and place. Affect is normal. There is no jugulovenous distention. There no carotid bruits. Lungs reveal a few scattered rhonchi. There is no respiratory distress. Cardiac exam reveals S1 and S2. There no clicks or significant murmurs. The abdomen is soft. There is no peripheral edema. There no musculoskeletal deformities. There are no skin rashes.  Labs:   Lab Results  Component Value Date   WBC 10.2 04/20/2012   HGB 6.7* 04/20/2012   HCT 19.2* 04/20/2012   MCV 94.6 04/20/2012   PLT 140* 04/20/2012    Lab 04/20/12 0052  NA 137  K 3.0*  CL 103  CO2 25  BUN 61*  CREATININE 1.58*  CALCIUM 8.6  PROT --  BILITOT --  ALKPHOS --  ALT --  AST --  GLUCOSE  125*   Lab Results  Component Value Date   TROPONINI 0.33* 04/20/2012    No results found for this basename: CHOL   No results found for this basename: HDL   No results found for this basename: LDLCALC   No results found for this basename: TRIG   No results found for this basename: CHOLHDL   No results found for this basename: LDLDIRECT      Radiology: Dg Chest Port 1 View  04/20/2012  *RADIOLOGY REPORT*  Clinical Data: Cough and shortness of breath.  PORTABLE CHEST - 1 VIEW  Comparison: None.  Findings:   Single view of the chest was obtained.  There may be mild peribronchial thickening or even bronchiectasis in the medial right lung base.  Otherwise, lungs are clear.  Heart size is normal.  Trachea is midline.  IMPRESSION: Question some bronchiectasis at the right lung base. No focal airspace disease.   Original Report Authenticated By: Glenn Glenn, M.D.    EKG    I have reviewed the only EKG available. There are diffuse nonspecific ST-T wave changes with ST depression. There is no old tracing for comparison.  ASSESSMENT AND PLAN:    *Upper GI bleed    The patient has had a significant upper GI bleed. Fortunately he is stable. The bleeding site was found on his endoscopy and treated. He has been transfused. His hemoglobin is still low.   External hemorrhoids   Anemia    Anemia is secondary to his GI bleeding. This is being treated.   Chest pain on exertion    His recent chest pain with exertion probably was angina. It was related primarily to his severe anemia.   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   CKD (chronic kidney disease) stage 3, GFR 30-59 ml/min    The patient's creatinine is mildly elevated. However it is close to his usual range. Some of the BUN elevation may be related to his GI bleed.   Elevated troponin    The patient's second troponin is very slightly elevated. This is probably related to demand  ischemia.    CAD (coronary artery disease)    The patient has known coronary disease. This is outlined in the first lines of my note above. He did receive a stent in 2007. There was a high-grade diagonal lesion to a small diagonal that was left alone after a wire could not be passed. There was also 60-70% ostial PDA disease. At this point the primary culprit is probably his GI bleed. When this is stabilize consideration could be given to further testing. Two-dimensional echo will be done to  be sure that there has been no change in his LV function. LV function was normal in the past.  Hypokalemia   The patient's potassium needs to be treated.  As part of this evaluation I have spent an extensive amount of time trying to find his old records. Eventually I was able to find his old cath report in e-chart. I have spoken with Dr. Sw[place], his primary cardiologist, who will try to see him as the hospitalization goes forward.  Signed:  Jerral Bonito, MD  04/20/2012, 5:14 PM

## 2012-04-20 NOTE — Progress Notes (Signed)
Pt transferred to ENDO via w/c, IV, with ENDO tech and RN.

## 2012-04-20 NOTE — OR Nursing (Signed)
Lab called w/panic troponin 0.33. Called RN Morrie Sheldon for [id number] and relayed report.

## 2012-04-21 LAB — COMPREHENSIVE METABOLIC PANEL
ALT: 23 U/L (ref 0–53)
Calcium: 8.7 mg/dL (ref 8.4–10.5)
GFR calc Af Amer: 66 mL/min — ABNORMAL LOW (ref >90)
Glucose, Bld: 110 mg/dL — ABNORMAL HIGH (ref 70–99)
Sodium: 142 mEq/L (ref 135–145)
Total Protein: 5.1 g/dL — ABNORMAL LOW (ref 6.0–8.3)

## 2012-04-21 LAB — CBC
HCT: 26 % — ABNORMAL LOW (ref 39.0–52.0)
Hemoglobin: 9 g/dL — ABNORMAL LOW (ref 13.0–17.0)
MCHC: 34.6 g/dL (ref 30.0–36.0)
RBC: 2.8 MIL/uL — ABNORMAL LOW (ref 4.22–5.81)
WBC: 8.5 10*3/uL (ref 4.0–10.5)

## 2012-04-21 LAB — TROPONIN I: Troponin I: 0.31 ng/mL (ref <0.30)

## 2012-04-21 LAB — HEMOGLOBIN AND HEMATOCRIT, BLOOD: Hemoglobin: 8.6 g/dL — ABNORMAL LOW (ref 13.0–17.0)

## 2012-04-21 MED ORDER — PANTOPRAZOLE SODIUM 40 MG PO TBEC
40.0000 mg | DELAYED_RELEASE_TABLET | Freq: Two times a day (BID) | ORAL | Status: DC
  Administered 2012-04-21: 40 mg via ORAL
  Filled 2012-04-21: qty 1

## 2012-04-21 MED ORDER — PANTOPRAZOLE SODIUM 40 MG PO TBEC: 40.0000 mg | DELAYED_RELEASE_TABLET | Freq: Two times a day (BID) | ORAL | Status: DC

## 2012-04-21 MED ORDER — POTASSIUM CHLORIDE ER 10 MEQ PO TBCR: 10.0000 meq | EXTENDED_RELEASE_TABLET | Freq: Two times a day (BID) | ORAL | Status: DC

## 2012-04-21 MED ORDER — FERROUS GLUCONATE 216 MG PO TABS: 216.0000 mg | ORAL_TABLET | Freq: Two times a day (BID) | ORAL | Status: DC

## 2012-04-21 MED ORDER — POTASSIUM CHLORIDE CRYS ER 20 MEQ PO TBCR
40.0000 meq | EXTENDED_RELEASE_TABLET | Freq: Once | ORAL | Status: AC
  Administered 2012-04-21: 40 meq via ORAL
  Filled 2012-04-21: qty 2

## 2012-04-21 NOTE — Progress Notes (Addendum)
Subjective:  This am the patient is feeling well.  He denies any chest tightness or shortness of breath.  Blood pressure is still soft.  Heart rhythm is normal sinus rhythm.  Objective:  Vital Signs in the last 24 hours: Temp:  [97.7 F (36.5 C)-98.9 F (37.2 C)] 97.9 F (36.6 C) (01/22 0400) Pulse Rate:  [51-91] 51  (01/22 0400) Resp:  [9-21] 13  (01/22 0400) BP: (95-136)/(39-78) 109/44 mmHg (01/22 0400) SpO2:  [94 %-100 %] 98 % (01/22 0400) Weight:  [191 lb 5.8 oz (86.8 kg)] 191 lb 5.8 oz (86.8 kg) (01/21 2313)  Intake/Output from previous day: 01/21 0701 - 01/22 0700 In: 4184.6 [I.V.:3356.7; Blood:627.9; IV Piggyback:200] Out: 1625 [Urine:1625] Intake/Output from this shift:       . sodium chloride  3 mL Intravenous Q12H      . dextrose 5 % and 0.9% NaCl 100 mL/hr at 04/21/12 0129  . pantoprozole (PROTONIX) infusion 8 mg/hr (04/21/12 0232)    Physical Exam: The patient appears to be in no distress.  Head and neck exam reveals that the pupils are equal and reactive.  The extraocular movements are full.  There is no scleral icterus.  Mouth and pharynx are benign.  No lymphadenopathy.  No carotid bruits.  The jugular venous pressure is normal.  Thyroid is not enlarged or tender.  Chest is clear to percussion and auscultation.  No rales or rhonchi.  Expansion of the chest is symmetrical.  Heart reveals no abnormal lift or heave.  First and second heart sounds are normal.  There is no murmur gallop rub or click.  The abdomen is soft and nontender.  Bowel sounds are normoactive.  There is no hepatosplenomegaly or mass.  There are no abdominal bruits.  Extremities reveal no phlebitis or edema.  Pedal pulses are good.  There is no cyanosis or clubbing.  Neurologic exam is normal strength and no lateralizing weakness.  No sensory deficits.  Integument reveals no rash  Lab Results:  Basename 04/21/12 0034 04/20/12 1049 04/20/12 0826  WBC -- 10.2 10.0  HGB 8.6* 6.7*  --  PLT -- 140* 134*    Basename 04/20/12 0052  NA 137  K 3.0*  CL 103  CO2 25  GLUCOSE 125*  BUN 61*  CREATININE 1.58*    Basename 04/21/12 0034 04/20/12 1645  TROPONINI 0.31* 0.32*   Hepatic Function Panel No results found for this basename: PROT,ALBUMIN,AST,ALT,ALKPHOS,BILITOT,BILIDIR,IBILI in the last 72 hours No results found for this basename: CHOL in the last 72 hours No results found for this basename: PROTIME in the last 72 hours  Imaging: Dg Chest Port 1 View  04/20/2012  *RADIOLOGY REPORT*  Clinical Data: Cough and shortness of breath.  PORTABLE CHEST - 1 VIEW  Comparison: None.  Findings:   Single view of the chest was obtained.  There may be mild peribronchial thickening or even bronchiectasis in the medial right lung base.  Otherwise, lungs are clear.  Heart size is normal.  Trachea is midline.  IMPRESSION: Question some bronchiectasis at the right lung base. No focal airspace disease.   Original Report Authenticated By: Richarda Overlie, M.D.     Cardiac Studies: Telemetry shows normal sinus rhythm. Two-dimensional echocardiogram pending. Assessment/Plan:  Patient Active Hospital Problem List: Upper GI bleed (04/20/2012)   Assessment: Hemoglobin 8.6 after 4 units of blood    Plan: Follow hemoglobin closely   CKD (chronic kidney disease) stage 3, GFR 30-59 ml/min (04/20/2012)   Assessment: BUN may be  falsely elevated because of GI bleed    Plan: Follow BMET CAD (coronary artery disease) ()   Assessment: Mild elevation of troponins consistent with demand ischemia from severe GI bleed and anemia    Plan: His daily aspirin is being held because of his GI bleed.  An echocardiogram will be performed today to look at his LV function.  Repeat EKG ordered for this morning.  After he has recovered from his GI bleed we will plan to do an outpatient ischemic workup.  After discharge from the hospital he will follow up with his primary cardiologist Dr. Swazil[place].   LOS: 1 day     Cassell Clement 04/21/2012, 7:43 AM

## 2012-04-21 NOTE — Progress Notes (Signed)
No signs of further bleeding. He is up walking in the halls and wants to go home. VSS. From a GI standpoint I think he can go home on a PPI. Would begin iron supplement also.  Call us if you need [place] again.

## 2012-04-21 NOTE — Progress Notes (Addendum)
Pt discharged home with wife. Pt verbalized understanding d/c instructions and follow-up appts. Pt denied having any pain at the time of discharge; VS stable.

## 2012-04-21 NOTE — Clinical Documentation Improvement (Signed)
Anemia Documentation Clarification Query  THIS DOCUMENT IS NOT A PERMANENT PART OF THE MEDICAL RECORD  RESPOND TO THE THIS QUERY, FOLLOW THE INSTRUCTIONS BELOW:  1. If needed, update documentation for the patients encounter via the notes activity.  2. Access this query again and click edit on the In [employer].  3. After updating, or not, click F2 to complete all highlighted (required) fields concerning your review. Select "additional documentation in the medical record" OR "no additional documentation provided".  4. Click Sign note button.  5. The deficiency will fall out of your In Basket *Please let us know if you are not able to complete this workflow by phone or e-mail (listed below).          04/21/12  Dear Dr.  Kirby Funk Marton Redwood  In an effort to better capture your patient's severity of illness, reflect appropriate length of stay and utilization of resources, a review of the patient medical record has revealed the following indicators.    Based on your clinical judgment, please clarify and document in a progress note and/or discharge summary the clinical condition associated with the following supporting information:  You may use possible, probable, or suspect with inpatient documentation. Possible, probable, suspected diagnoses MUST be documented at the time of discharge.  In responding to this query please exercise your independent judgment.  The fact that a query is asked, does not imply that any particular answer is desired or expected.   Possible Clinical Conditions?  " Acute Blood Loss Anemia " Other Condition____________  " Cannot Clinically Determine      Supporting Information:  Signs and Symptoms:   Per MD progress notes: 1/222 & 1/21 Hemoglobin 8.6 after 4 units of blood, Hemoglobin 8.6 after 4 units of blood  Anemia transfused 4 U PRBCs, awaiting am CBC Anemia is secondary to his GI bleeding. This is being  treated.   Diagnostics: Component     Latest Ref Rng 04/20/2012 04/20/2012 04/20/2012 04/20/2012 04/20/2012        12:52 AM  1:05 AM  1:05 AM  1:05 AM  1:05 AM  Hemoglobin     13.0 - 17.0 g/dL 5.3 (LL)      HCT     16.1 - 52.0 % 15.6 (L)        Component     Latest Ref Rng 04/20/2012 04/20/2012 04/20/2012 04/20/2012 04/20/2012         2:00 AM  2:30 AM  5:02 AM  8:26 AM 10:49 AM  Hemoglobin     13.0 - 17.0 g/dL    6.7 (LL) 6.7 (LL)  HCT     39.0 - 52.0 %    19.2 (L) 19.2 (L)   Component     Latest Ref Rng 04/20/2012 04/20/2012 04/21/2012 04/21/2012 04/21/2012        12:30 PM  4:45 PM 12:34 AM  8:00 AM 10:54 AM  Hemoglobin     13.0 - 17.0 g/dL   8.6 (L)  9.0 (L)  HCT     39.0 - 52.0 %   24.5 (L)  26.0 (L)    Treatments: Transfusion:4 units of PRBCs    Reviewed: additional documentation in the medical record by Dr. Valentina Lucks on 04/22/12.   Thank You,  Shelda Pal RN, BSN, CCM   Clinical Documentation Specialist:  Pager [phone]  Or Phone  [phone] Health Information Management [place]

## 2012-04-21 NOTE — Progress Notes (Signed)
Subjective: No bowel movements, no complaints  Objective: Vital signs in last 24 hours: Temp:  [97.7 F (36.5 C)-98.9 F (37.2 C)] 97.9 F (36.6 C) (01/22 0400) Pulse Rate:  [51-91] 51  (01/22 0400) Resp:  [9-21] 13  (01/22 0400) BP: (95-136)/(39-78) 109/44 mmHg (01/22 0400) SpO2:  [94 %-100 %] 98 % (01/22 0400) Weight:  [86.8 kg (191 lb 5.8 oz)] 86.8 kg (191 lb 5.8 oz) (01/21 2313) Weight change: 4.245 kg (9 lb 5.8 oz) Last BM Date: 04/20/12  Intake/Output from previous day: 01/21 0701 - 01/22 0700 In: 4184.6 [I.V.:3356.7; Blood:627.9; IV Piggyback:200] Out: 1625 [Urine:1625] Intake/Output this shift:    General appearance: alert and cooperative Resp: clear to auscultation bilaterally Cardio: regular rate and rhythm, S1, S2 normal, no murmur, click, rub or gallop GI: soft, non-tender; bowel sounds normal; no masses,  no organomegaly  Lab Results:  Basename 04/21/12 0034 04/20/12 1049 04/20/12 0826  WBC -- 10.2 10.0  HGB 8.6* 6.7* --  HCT 24.5* 19.2* --  PLT -- 140* 134*   BMET  Basename 04/20/12 0052  NA 137  K 3.0*  CL 103  CO2 25  GLUCOSE 125*  BUN 61*  CREATININE 1.58*  CALCIUM 8.6    Studies/Results: Dg Chest Port 1 View  04/20/2012  *RADIOLOGY REPORT*  Clinical Data: Cough and shortness of breath.  PORTABLE CHEST - 1 VIEW  Comparison: None.  Findings:   Single view of the chest was obtained.  There may be mild peribronchial thickening or even bronchiectasis in the medial right lung base.  Otherwise, lungs are clear.  Heart size is normal.  Trachea is midline.  IMPRESSION: Question some bronchiectasis at the right lung base. No focal airspace disease.   Original Report Authenticated By: Richarda Overlie, M.D.     Medications: I have reviewed the patient's current medications.  Assessment/Plan: Principal Problem:  *Upper GI bleed  EGD noted.  No further bowel movements. Check orthostatics and if ok, ambulate on floor.  Change to po ppi.  Will leave it to GI to  advance diet.  Holding ASA.  Possible D/C later today? Active Problems:  Anemia transfused 4 U PRBCs, awaiting am CBC  CKD (chronic kidney disease) stage 3, GFR 30-59 ml/min at baseline  CAD (coronary artery disease) slight + troponin, appreciate cardiology input, echo today.   LOS: 1 day   Lillia Mountain   [phone] 04/21/2012, 7:38 AM

## 2012-04-21 NOTE — Progress Notes (Signed)
Pt refused ECHO at this time and states that he just wants to go home. MD is aware.

## 2012-04-22 ENCOUNTER — Encounter (HOSPITAL_COMMUNITY): Payer: Self-pay | Admitting: Gastroenterology

## 2012-04-22 LAB — TYPE AND SCREEN
Antibody Screen: NEGATIVE
Unit division: 0
Unit division: 0
Unit division: 0

## 2012-04-22 NOTE — Discharge Summary (Signed)
Physician Discharge Summary  Patient ID: Glenn Hunt. MRN: [id number] DOB/AGE: October 27, 1931 79 y.o.  Admit date: 04/20/2012 Discharge date: 04/22/2012  Admission Diagnoses: Upper GI bleed Acute blood loss anemia Angina Chronic kidney disease stage III Coronary artery disease  Discharge Diagnoses:  Principal Problem:  *Upper GI bleed Active Problems: Dieluofoy gastric lesion  Acute blood loss anemia Angina  CKD (chronic kidney disease) stage 3, GFR 30-59 ml/min  CAD (coronary artery disease)   Discharged Condition: good  Hospital Course: While on vacation in [place] the patient began feeling weak and having episodes of substernal chest point pain. He has some small black stools. He came back to the [place] and had a large black stool and went to the emergency room. In the ER he was found to have a hemoglobin of 5.4 and a BUN of 61. Admitted for probable upper GI bleed. At admission his WBC was 9.5, hemoglobin 5.4, potassium 3.0, BUN 61, creatinine 1.58. Chest x-ray showed question some bronchiectasis right lung base. The patient was transfused a total of 4 units PRBCs for acute blood loss anemia. At discharge his hemoglobin was 9.0. He was given IV narcotics. He was seen by Dr. Evette Cristal of the GI service and underwent upper endoscopy on January 21 and was found to have a dielufoy lesion in the body of the stomach, treated with epinephrine and Endo Clip. The patient had no further evidence of bleeding during hospitalization. Aspirin was stopped. He was seen by cardiology as his troponin was mildly positive. EKG was nonacute. Outpatient cardiology followup was arranged. An echocardiogram was recommended but declined by the patient while he was an inpatient. The patient was advanced to clear and then regular diet which he tolerated well. He was discharged in good condition. The patient did ambulate without difficulty prior to discharge. Blood pressure medicine was held at discharge  his blood pressures are running in the one teens over 70s. The patient was sent home with 3 days of potassium as potassium prior to discharge was 3.1 . Consults: cardiology and GI  Significant Diagnostic Studies: labs: As above, radiology: CXR: normal and endoscopy: gastroscopy: As above  Treatments: IV hydration and IV proton pump inhibitor and transfusion for unit PRBC and treatment of gastric bleeding lesion with epinephrine and Endo Clip  Discharge Exam: Blood pressure 121/56, pulse 68, temperature 97.8 F (36.6 C), temperature source Oral, resp. rate 16, height 5\' 7"  (1.702 m), weight 86.8 kg (191 lb 5.8 oz), SpO2 97.00%. Resp: clear to auscultation bilaterally Cardio: regular rate and rhythm, S1, S2 normal, no murmur, click, rub or gallop GI: soft, non-tender; bowel sounds normal; no masses,  no organomegaly  Disposition: 01-Home or Self Care     Medication List     As of 04/22/2012  6:50 AM    STOP taking these medications         aspirin 81 MG chewable tablet      triamterene-hydrochlorothiazide 75-50 MG per tablet   Commonly known as: MAXZIDE      TAKE these medications         allopurinol 300 MG tablet   Commonly known as: ZYLOPRIM   Take 150 mg by mouth daily.      ferrous gluconate 216 MG tablet   Commonly known as: FERGON   Take 1 tablet (216 mg total) by mouth 2 (two) times daily before a meal.      pantoprazole 40 MG tablet   Commonly known as: PROTONIX  Take 1 tablet (40 mg total) by mouth 2 (two) times daily.      potassium chloride 10 MEQ tablet   Commonly known as: K-DUR   Take 1 tablet (10 mEq total) by mouth 2 (two) times daily.           Follow-up Information    Follow up with Lillia Mountain, MD. In 14 days.   Contact information:   [address], [address] Jaynie Crumble [place] [address] [phone]          Signed: Lillia Mountain 04/22/2012, 6:50 AM

## 2012-09-07 ENCOUNTER — Other Ambulatory Visit: Payer: Self-pay | Admitting: Dermatology

## 2012-12-10 ENCOUNTER — Other Ambulatory Visit: Payer: Self-pay | Admitting: Dermatology

## 2013-03-07 ENCOUNTER — Other Ambulatory Visit: Payer: Self-pay | Admitting: Dermatology

## 2013-06-10 ENCOUNTER — Other Ambulatory Visit: Payer: Self-pay | Admitting: Dermatology

## 2013-09-05 ENCOUNTER — Other Ambulatory Visit: Payer: Self-pay | Admitting: Dermatology

## 2014-03-14 ENCOUNTER — Other Ambulatory Visit: Payer: Self-pay | Admitting: Dermatology

## 2015-04-01 HISTORY — PX: CATARACT EXTRACTION, BILATERAL: SHX1313

## 2016-05-12 ENCOUNTER — Ambulatory Visit (INDEPENDENT_AMBULATORY_CARE_PROVIDER_SITE_OTHER): Payer: Medicare Other | Admitting: Sports Medicine

## 2016-05-12 DIAGNOSIS — M1711 Unilateral primary osteoarthritis, right knee: Secondary | ICD-10-CM | POA: Diagnosis not present

## 2016-05-12 MED ORDER — METHYLPREDNISOLONE ACETATE 40 MG/ML IJ SUSP
40.0000 mg | Freq: Once | INTRAMUSCULAR | Status: AC
  Administered 2016-05-12: 40 mg via INTRA_ARTICULAR

## 2016-05-12 NOTE — Progress Notes (Signed)
   Subjective:    Patient ID: Glenn Carol., male    DOB: April 09, 1931, 83 y.o.   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 He complains of pain primarily along the lateral aspect of his knee. His pain will occasionally radiate down the lateral aspect of his lower leg. His symptoms are most noticeable at night. They are tolerable during the day. He has a history of lumbar radiculopathy but feels like his current knee and leg pain is different than what he is experienced with that in the past. He denies any significant problems with his knees in the past. He has not noticed any swelling. No mechanical symptoms. No prior knee surgeries. No recent imaging. He states that ibuprofen works very well for him but he has a history of renal cell carcinoma and he gets significant swelling and weight gain with prolonged ibuprofen use. He has found some success in taking Tylenol along with lower doses of ibuprofen. He has also noticed significant symptom relief when taking oral prednisone. In fact, he took 35 mg earlier today and it has helped.  Past medical history reviewed. It is significant for [facility] which is a stomach condition which predisposes him to arterial bleeding. He also has a history of renal cell carcinoma Medications reviewed Allergies reviewed He is a retired physician    Review of Systems As above    Objective:   Physical Exam  Well-developed, well-nourished. No acute distress. Awake alert and oriented 3. Vital signs reviewed  Right knee: Range of motion 0-120. No effusion. He has a palpable Baker's cyst in the popliteal fossa. He is tender to palpation along the lateral joint line. Moderate valgus deformity with standing. Good joint stability. Neurovascularly intact distally.      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 An anterior lateral approach was utilized. He tolerated this without difficulty. We will want to avoid NSAIDs if possible given his history of Dieulafoys. He is a very active and fit 83 year old think he can continue with activity as tolerated using pain as his guide. If his symptoms persist despite today's injection then I would start with getting x-rays of his right knee. He will follow-up with me in 4 weeks for reevaluation. He is encouraged to call with questions or concerns in the interim.  Consent obtained and verified. Time-out conducted. Noted no overlying erythema, induration, or other signs of local infection. Skin prepped in a sterile fashion. Topical analgesic spray: Ethyl chloride. Joint: right knee Needle: 22g 1.5 inch Completed without difficulty. Meds: 3cc 1% xylocaine, 1cc (40mg ) depomedrol  Advised to call if fevers/chills, erythema, induration, drainage, or persistent bleeding.

## 2016-05-19 ENCOUNTER — Ambulatory Visit
Admission: RE | Admit: 2016-05-19 | Discharge: 2016-05-19 | Disposition: A | Discharge status: Home / Self Care | Payer: Medicare Other | Source: Ambulatory Visit | Attending: Sports Medicine | Admitting: Sports Medicine

## 2016-05-19 ENCOUNTER — Other Ambulatory Visit: Payer: Self-pay | Admitting: Sports Medicine

## 2016-05-19 ENCOUNTER — Telehealth: Payer: Self-pay | Admitting: *Deleted

## 2016-05-19 DIAGNOSIS — M25561 Pain in right knee: Secondary | ICD-10-CM

## 2016-05-19 IMAGING — DX DG KNEE 3 VIEWS*R*
3 series · 3 of 3 positions shown · non-contrast
Comparison: None.

CLINICAL DATA: 84-year-old male with right knee chronic pain. No
injury.

EXAM:
RIGHT KNEE - 3 VIEW

[dg knee 3 views right (1 of 3)]
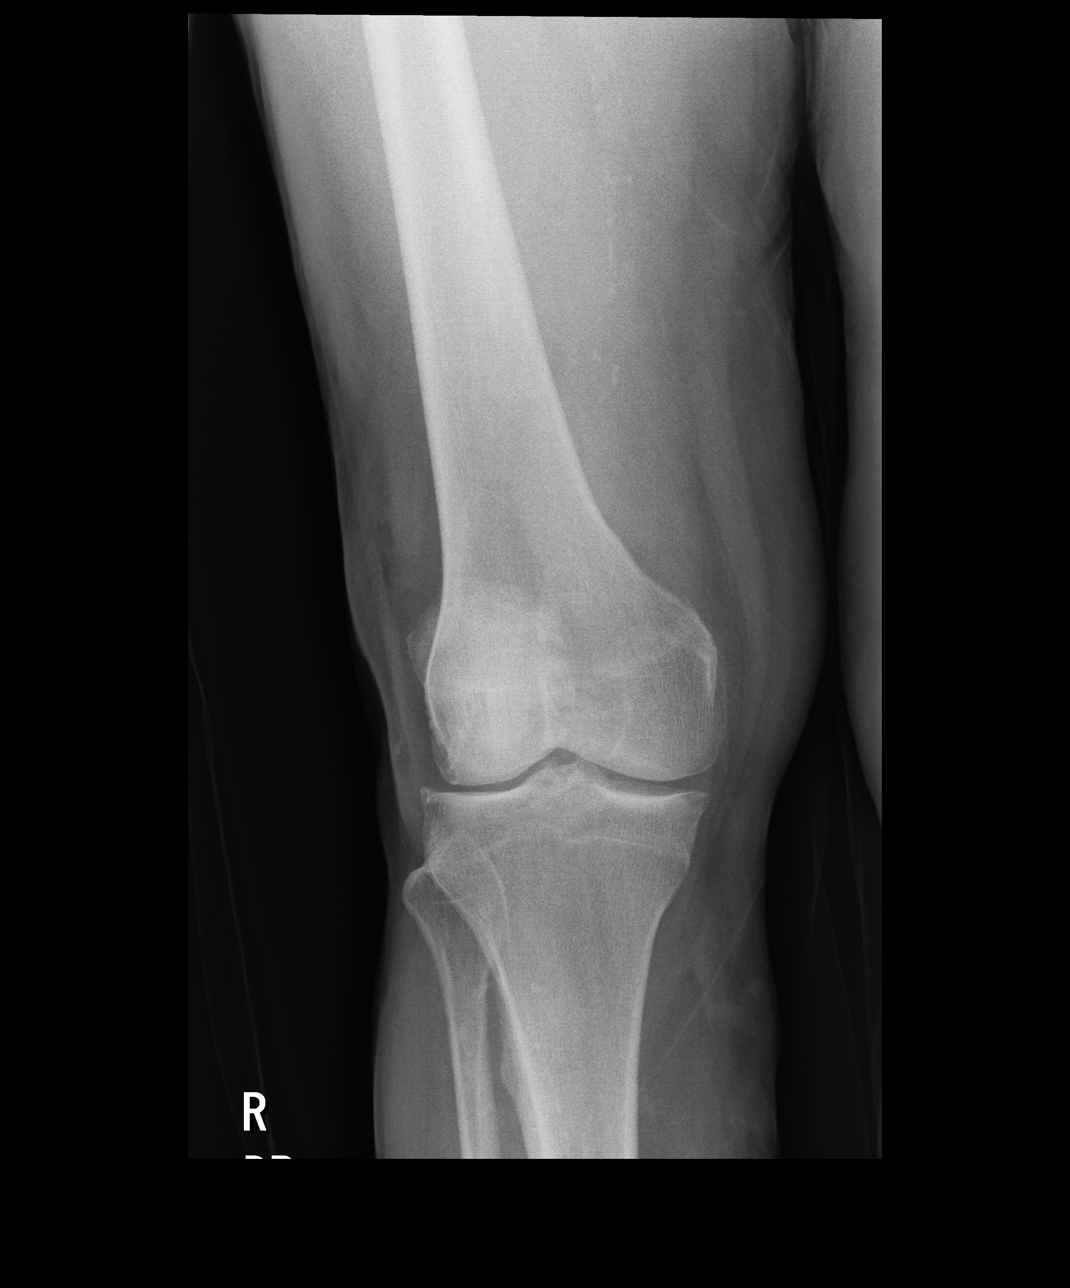

[dg knee 3 views right (2 of 3)]
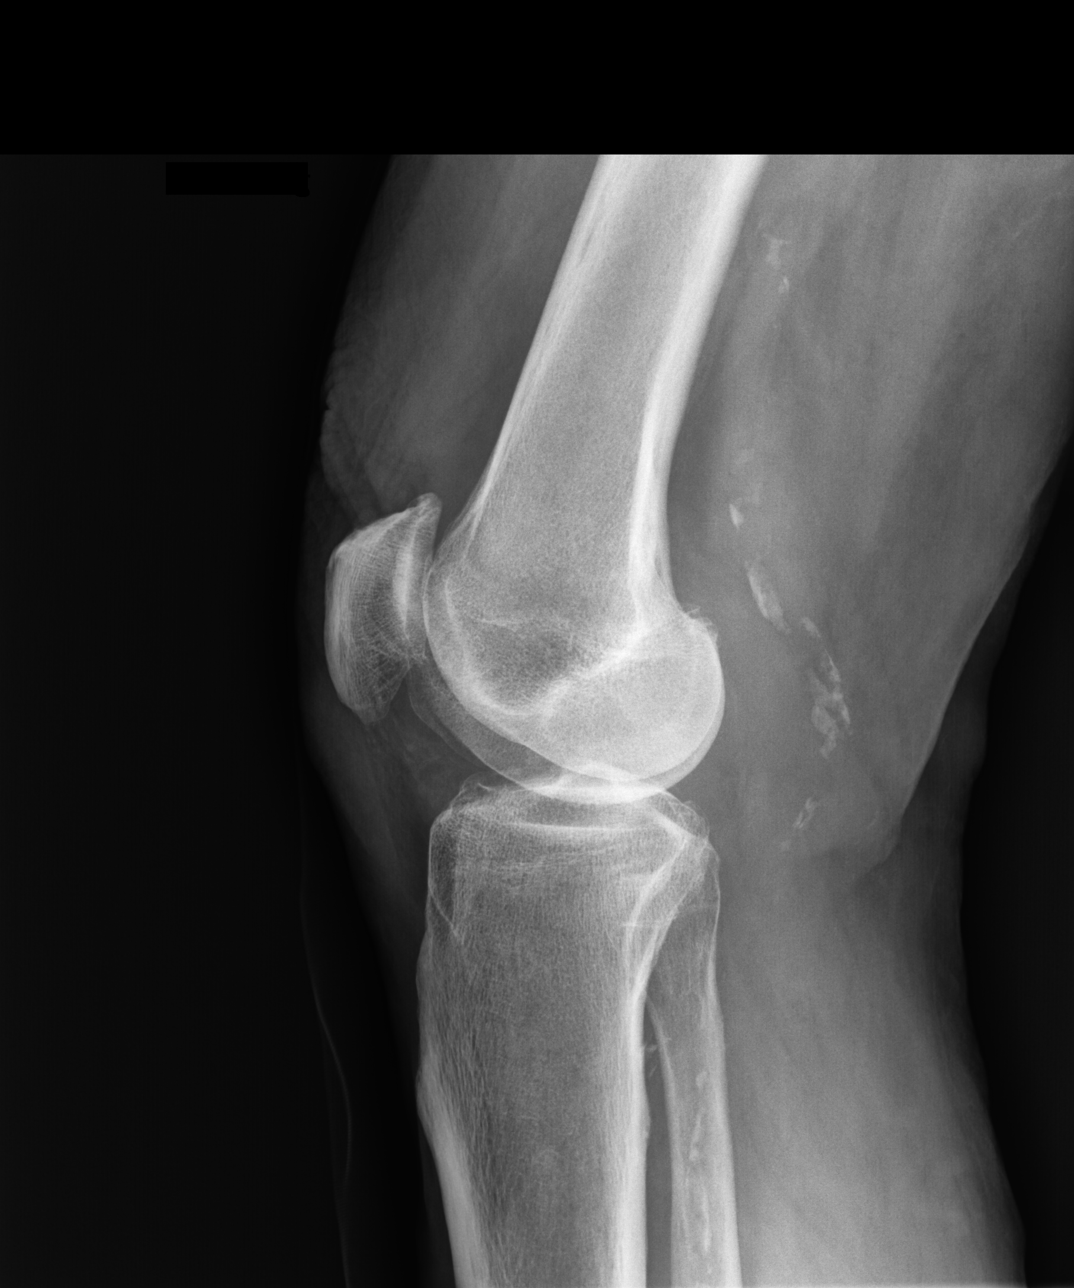

[dg knee 3 views right (3 of 3)]
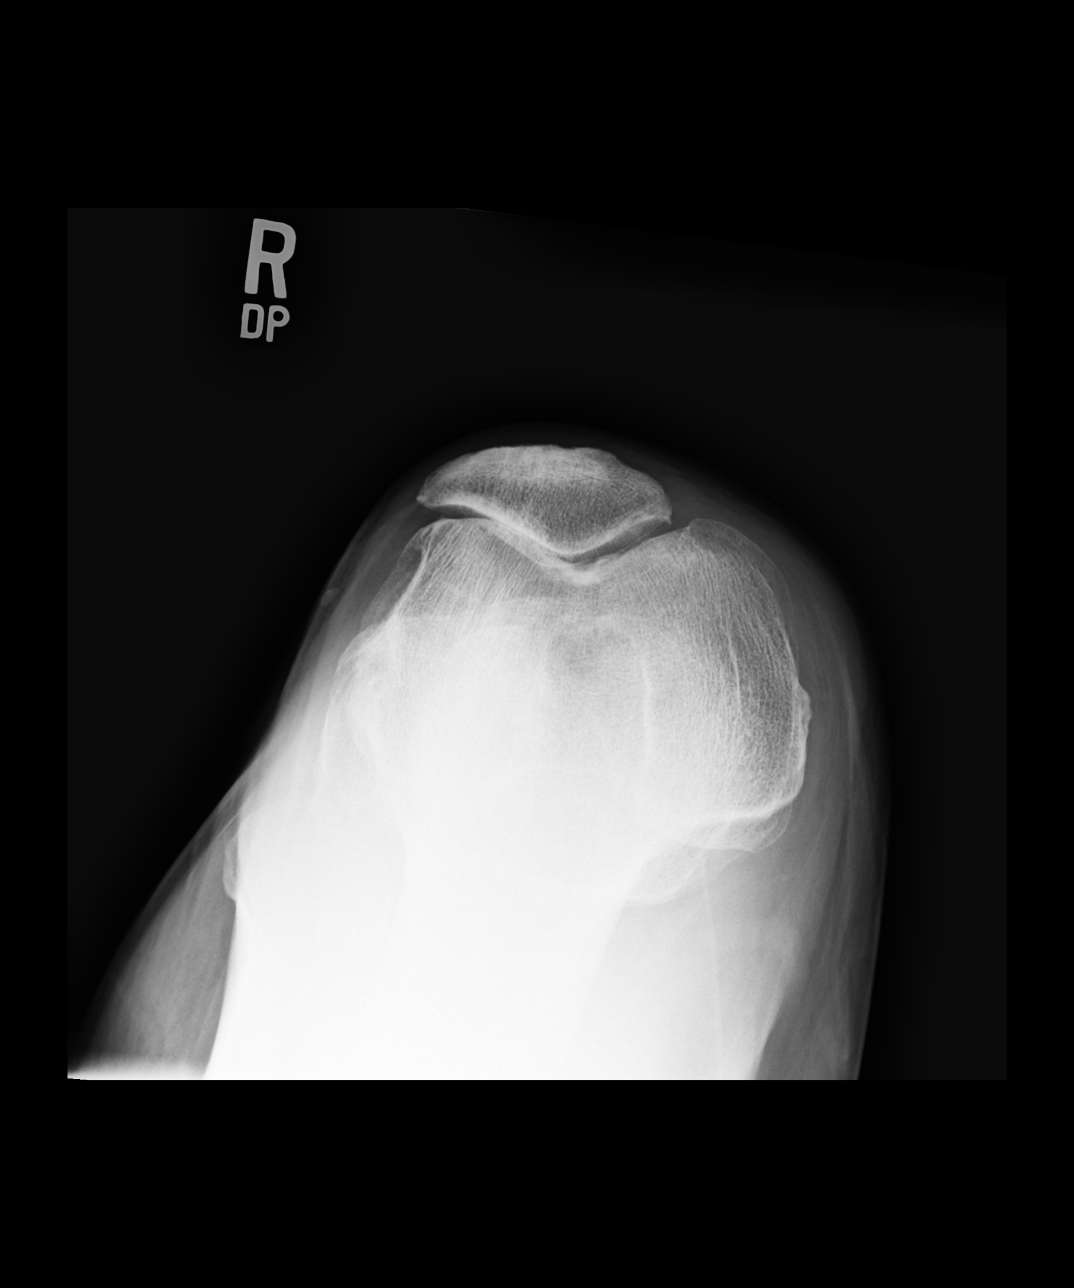

[3 of 3 positions shown; findings below may reference images not displayed]

FINDINGS: There is no acute fracture or dislocation. The bones are well
mineralized. There is osteoarthritic changes of the knee with
tricompartmental narrowing most prominent involving the
patellofemoral compartment with near complete loss of the joint
space. There is no joint effusion. Vascular calcification noted. The
soft tissues appear unremarkable.
IMPRESSION: 1. No acute fracture or dislocation.
2. Advanced osteoarthritic changes of the knee.

## 2016-05-19 NOTE — Telephone Encounter (Signed)
Per Dr Micheline Chapman, we will order some xrays to see what those reveal and give him a call once they are completed

## 2016-05-23 ENCOUNTER — Encounter: Payer: Self-pay | Admitting: Sports Medicine

## 2016-05-23 ENCOUNTER — Ambulatory Visit (INDEPENDENT_AMBULATORY_CARE_PROVIDER_SITE_OTHER): Payer: Medicare Other | Admitting: Sports Medicine

## 2016-05-23 VITALS — BP 151/61 | Ht 66.0 in | Wt 180.0 lb

## 2016-05-23 DIAGNOSIS — M1711 Unilateral primary osteoarthritis, right knee: Secondary | ICD-10-CM | POA: Diagnosis not present

## 2016-05-23 MED ORDER — METHYLPREDNISOLONE ACETATE 40 MG/ML IJ SUSP
40.0000 mg | Freq: Once | INTRAMUSCULAR | Status: AC
  Administered 2016-05-23: 40 mg via INTRA_ARTICULAR

## 2016-05-23 MED ORDER — DICLOFENAC SODIUM 1 % TD GEL: 2.0000 g | Freq: Four times a day (QID) | TRANSDERMAL | 1 refills | Status: DC

## 2016-05-23 NOTE — Progress Notes (Signed)
   Subjective:    Patient ID: Glenn Carol., male    DOB: May 20, 1931, 83 y.o.   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 Cortisone injection administered a couple of weeks ago provided him with 2 days of complete pain relief. He returned to the gym the day after the injection and started to get returning pain two days later. Since then he has not been doing any lower body strengthening. Pain is primarily along the lateral knee. It is tolerable but definitely aggravating. He has not noticed any swelling.    Review of Systems     Objective:   Physical Exam  Well-developed, well-nourished. No acute distress  Right knee: Full range of motion. No effusion. He is tender to palpation along the lateral joint line. Good ligament stability. Neurovascularly intact distally.  X-rays of the right knee including AP, lateral, and sunrise views show a moderate amount of lateral compartmental narrowing. He has bone-on-bone DJD in the patellofemoral compartment. Nothing acute.      Assessment & Plan:   Returning right knee pain secondary to DJD  Patient's right knee is reinjected today with cortisone. An anterior lateral approach is utilized. He tolerates this without difficulty. I've given him isometric quadriceps exercises and hamstring exercises to give to his personal trainer. He needs to avoid heavy squats and leg presses. I've also given him a prescription for topical Voltaren gel to use as needed. If his symptoms persist despite today's injection then I would recommend that he see Dr. Amada Hunt (he has seen him before) to discuss Visco supplementation. Follow-up with [place] as needed.  Consent obtained and verified. Time-out conducted. Noted no overlying erythema, induration, or other signs of local infection. Skin prepped in a sterile fashion. Topical analgesic spray: Ethyl chloride. Joint: right knee Needle: 25g  1.5 inch Completed without difficulty. Meds: 3cc 1% xylocaine, 1cc (40mg ) depomedrol  Advised to call if fevers/chills, erythema, induration, drainage, or persistent bleeding.

## 2016-06-09 ENCOUNTER — Ambulatory Visit: Payer: Medicare Other | Admitting: Sports Medicine

## 2016-06-13 ENCOUNTER — Other Ambulatory Visit: Payer: Self-pay | Admitting: *Deleted

## 2016-07-27 ENCOUNTER — Other Ambulatory Visit: Payer: Self-pay | Admitting: Sports Medicine

## 2016-12-23 ENCOUNTER — Ambulatory Visit
Admission: RE | Admit: 2016-12-23 | Discharge: 2016-12-23 | Disposition: A | Discharge status: Home / Self Care | Payer: Medicare Other | Source: Ambulatory Visit | Attending: Internal Medicine | Admitting: Internal Medicine

## 2016-12-23 ENCOUNTER — Other Ambulatory Visit: Payer: Self-pay | Admitting: Internal Medicine

## 2016-12-23 DIAGNOSIS — R05 Cough: Secondary | ICD-10-CM

## 2016-12-23 DIAGNOSIS — R059 Cough, unspecified: Secondary | ICD-10-CM

## 2016-12-23 IMAGING — CR DG CHEST 2V
2 series · 2 of 2 positions shown · non-contrast
Comparison: Chest x-ray of [DATE]

CLINICAL DATA: Coughing congestion for 3 weeks, smoking history

EXAM:
CHEST  2 VIEW

[w chest pa]
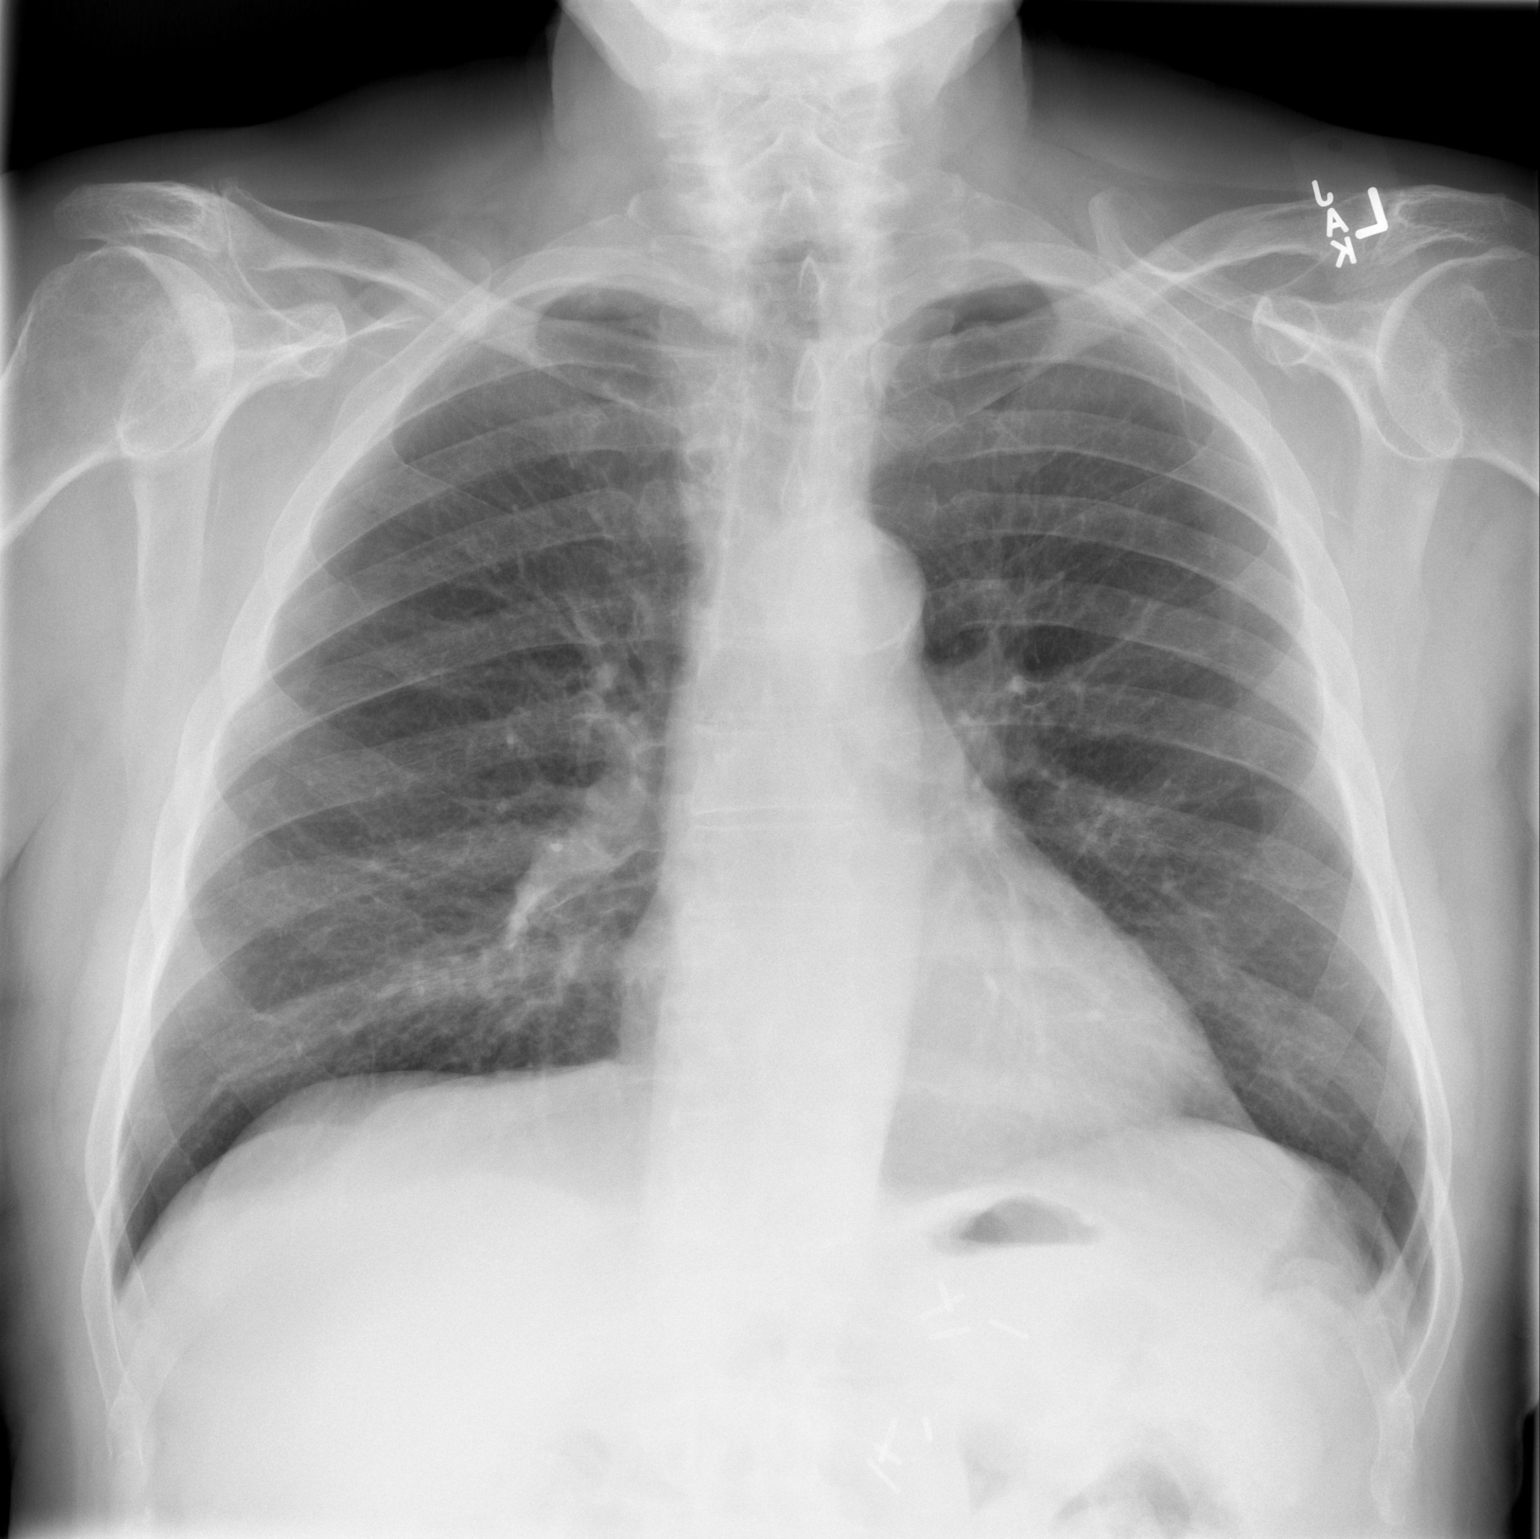

[w chest lat]
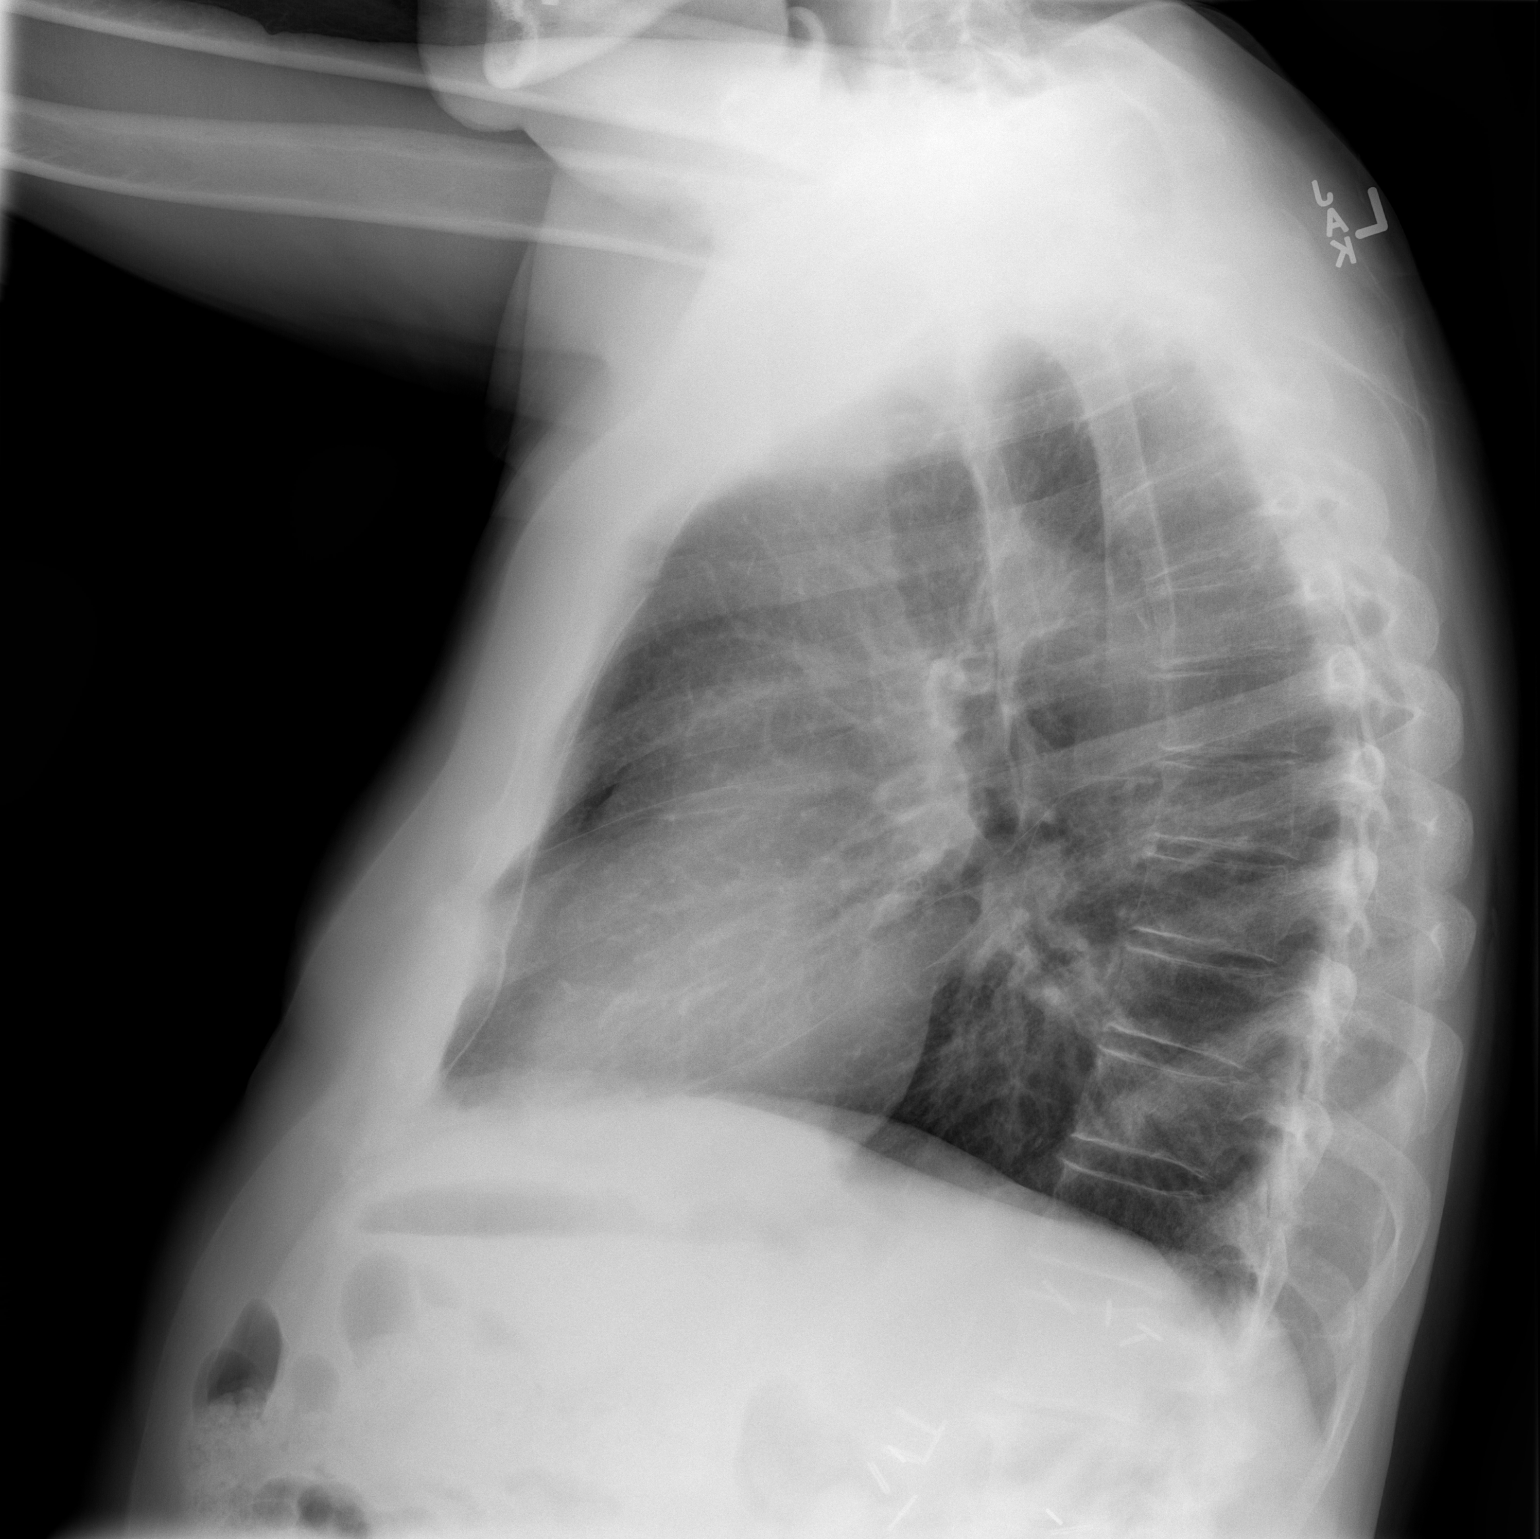

[2 of 2 positions shown; findings below may reference images not displayed]

FINDINGS: No active infiltrate or effusion is seen. Mediastinal and hilar
contours are unremarkable. The heart is within normal limits in
size. No acute bony abnormality is seen. Surgical clips are noted
overlying the left upper quadrant medially.
IMPRESSION: No active cardiopulmonary disease.

## 2017-04-16 DIAGNOSIS — M1710 Unilateral primary osteoarthritis, unspecified knee: Secondary | ICD-10-CM | POA: Insufficient documentation

## 2017-04-16 DIAGNOSIS — M179 Osteoarthritis of knee, unspecified: Secondary | ICD-10-CM | POA: Insufficient documentation

## 2017-04-16 DIAGNOSIS — M25562 Pain in left knee: Secondary | ICD-10-CM | POA: Insufficient documentation

## 2017-07-20 DIAGNOSIS — M1712 Unilateral primary osteoarthritis, left knee: Secondary | ICD-10-CM | POA: Insufficient documentation

## 2017-12-07 ENCOUNTER — Other Ambulatory Visit: Payer: Self-pay | Admitting: Nurse Practitioner

## 2017-12-07 ENCOUNTER — Ambulatory Visit
Admission: RE | Admit: 2017-12-07 | Discharge: 2017-12-07 | Disposition: A | Discharge status: Home / Self Care | Payer: Medicare Other | Source: Ambulatory Visit | Attending: Nurse Practitioner | Admitting: Nurse Practitioner

## 2017-12-07 DIAGNOSIS — R05 Cough: Secondary | ICD-10-CM

## 2017-12-07 DIAGNOSIS — R059 Cough, unspecified: Secondary | ICD-10-CM

## 2017-12-07 IMAGING — DX DG CHEST 2V
2 series · 2 of 2 positions shown · non-contrast
Comparison: [DATE]

CLINICAL DATA: Productive cough for 10 days.

EXAM:
CHEST - 2 VIEW

[dg chest 2 view (1 of 2)]
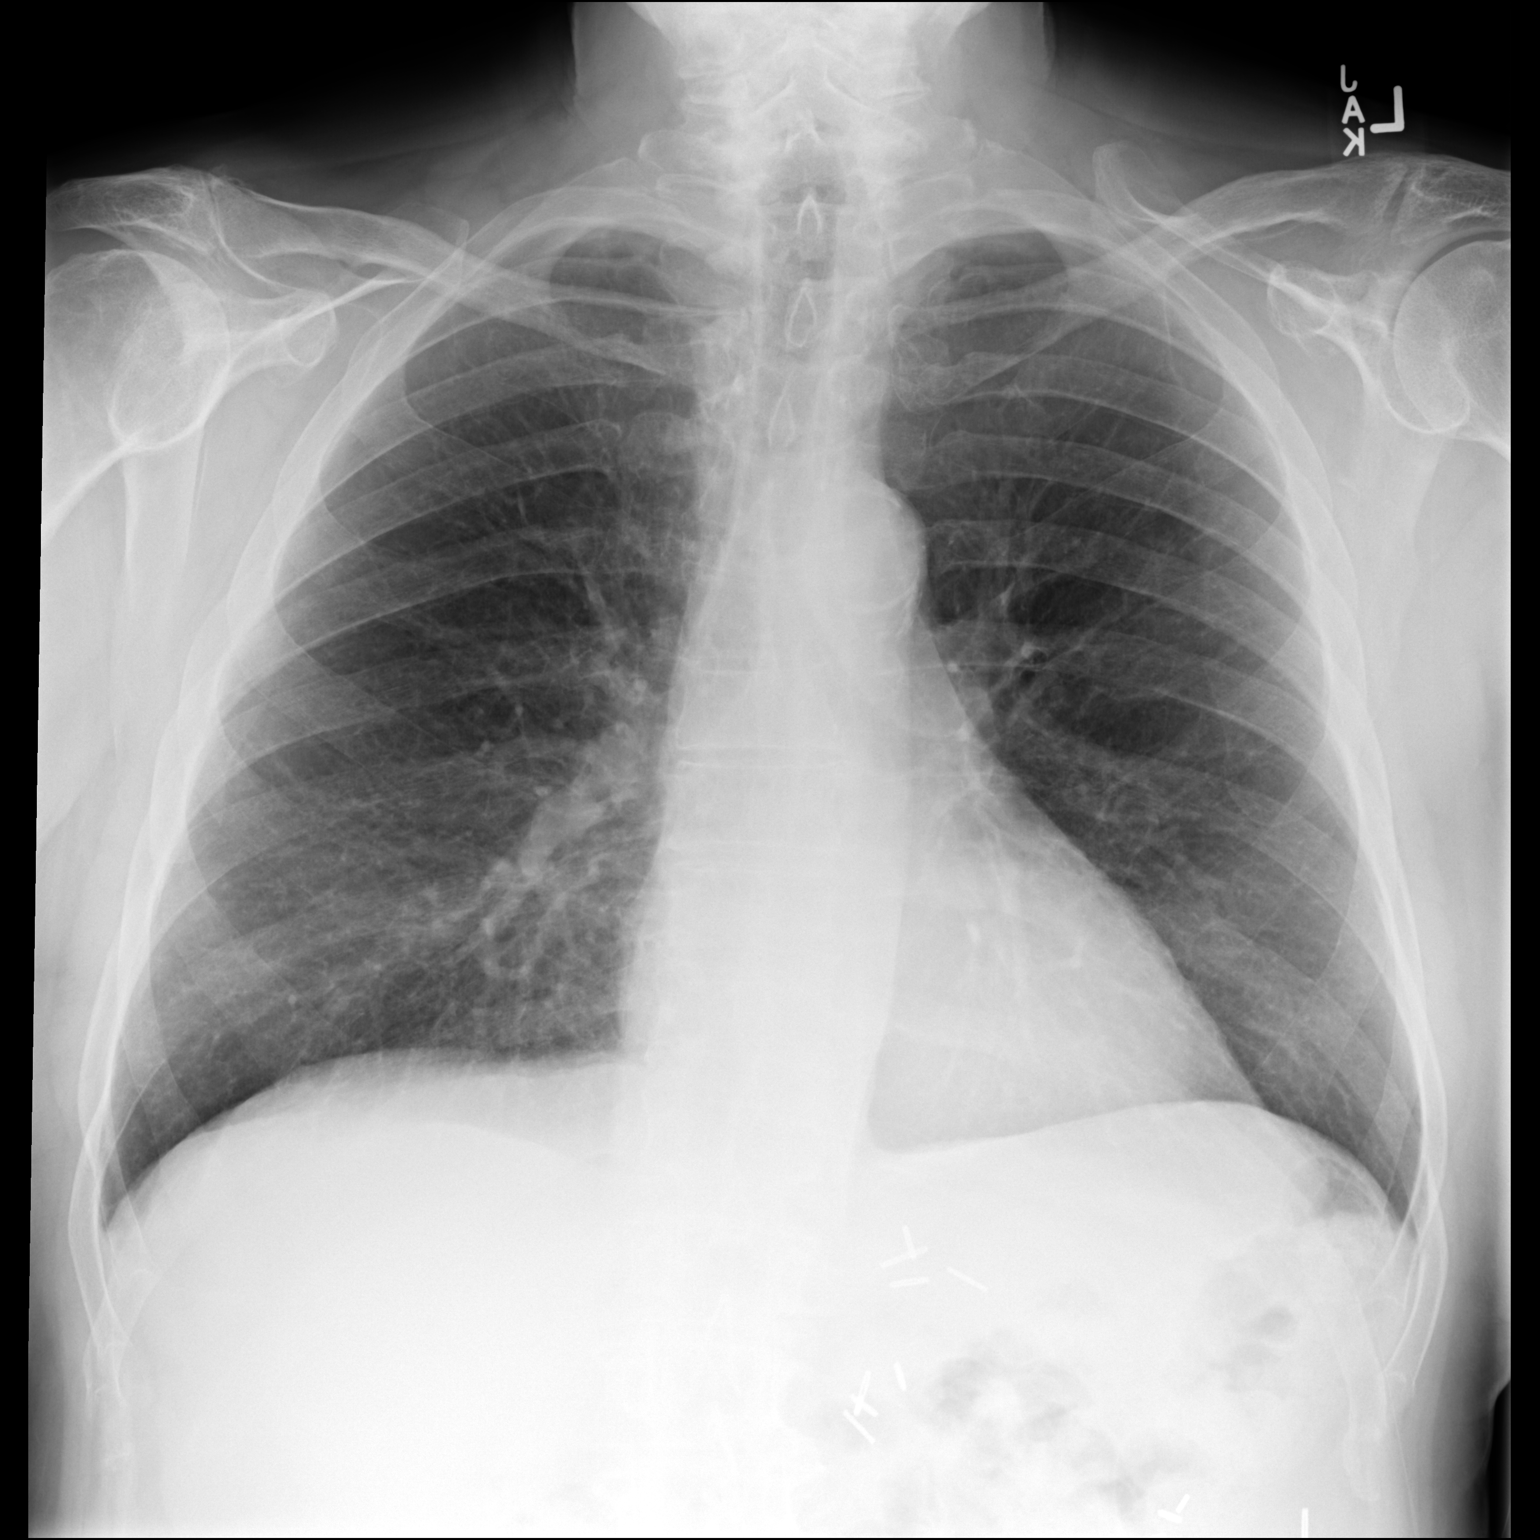

[dg chest 2 view (2 of 2)]
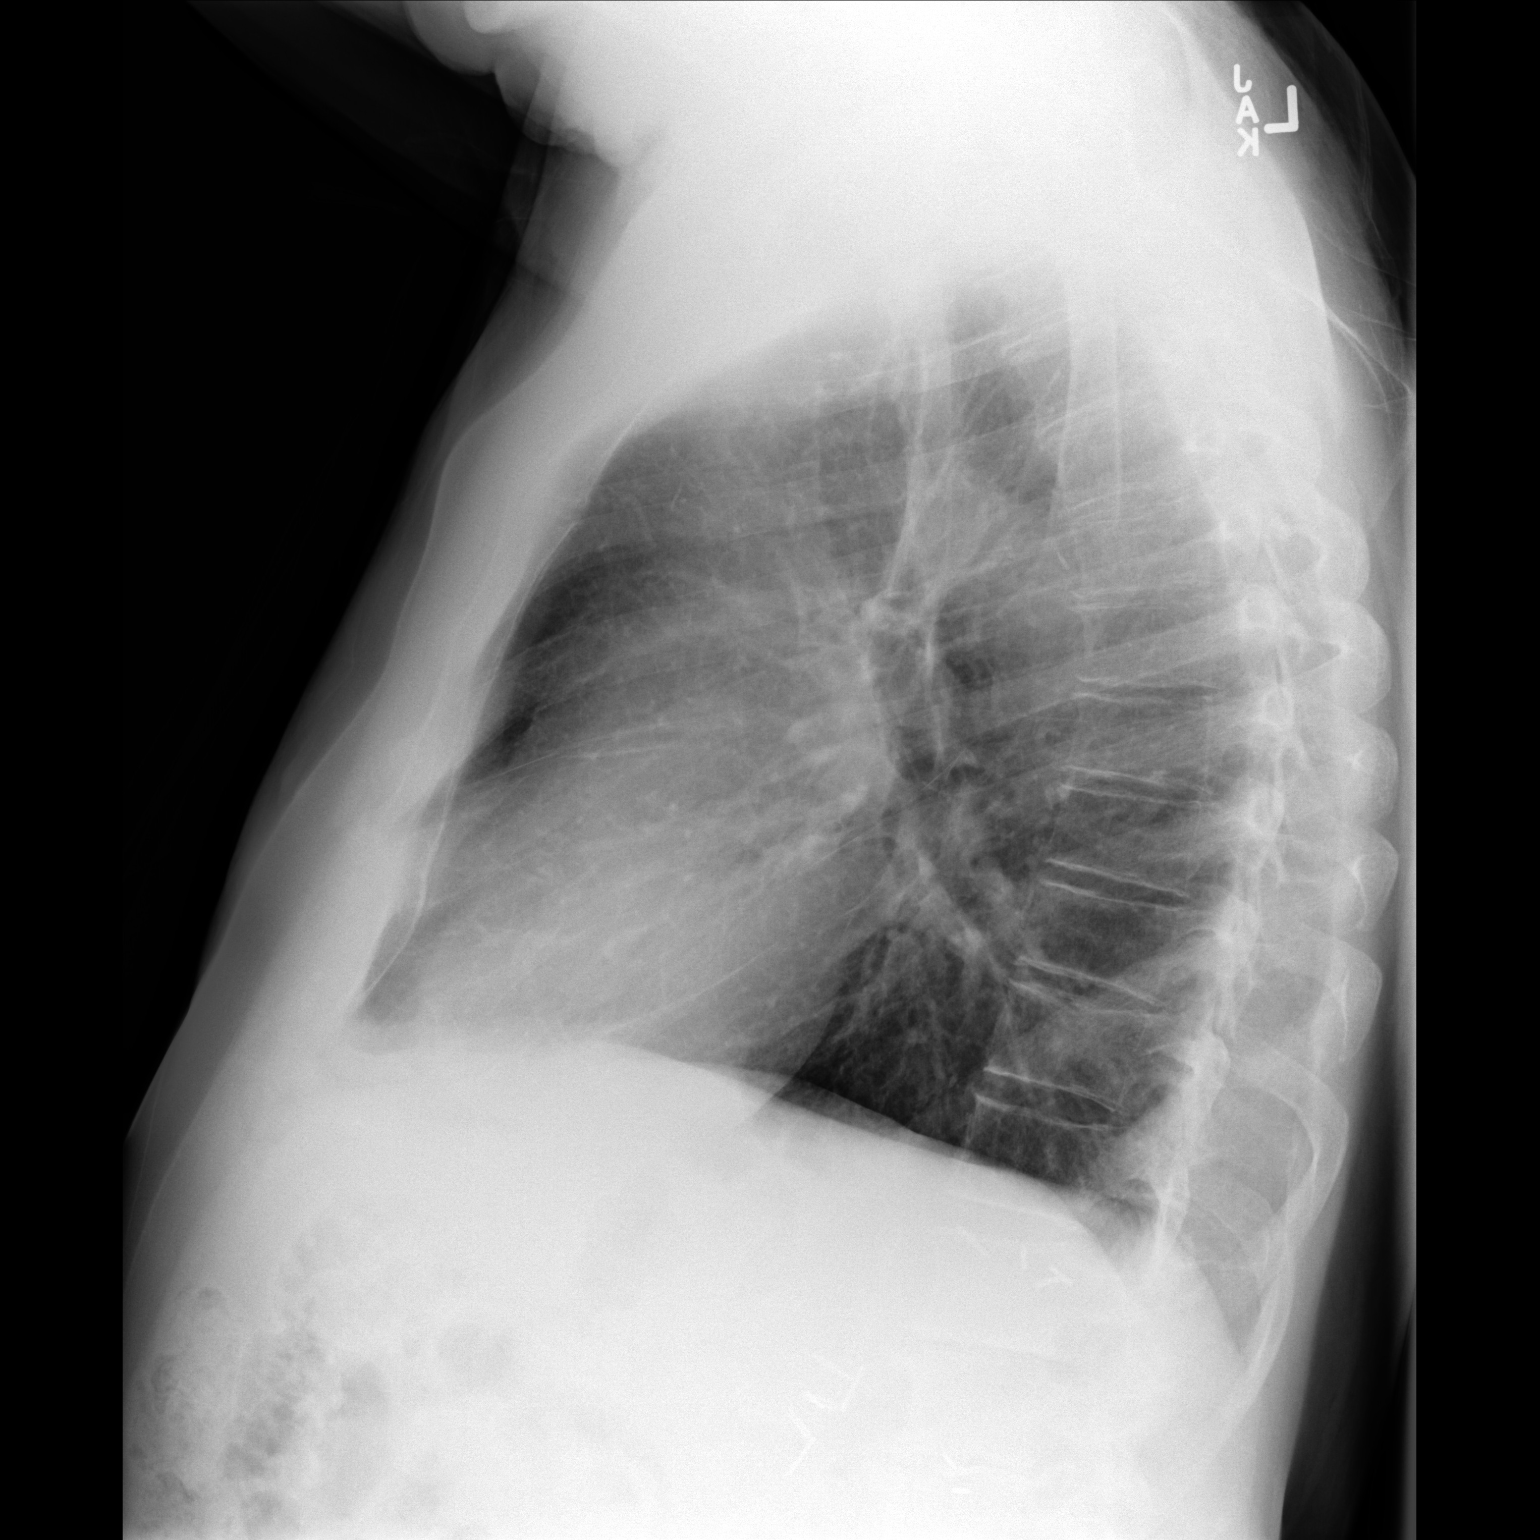

[2 of 2 positions shown; findings below may reference images not displayed]

FINDINGS: The cardiac silhouette, mediastinal and hilar contours are normal
and stable. Mild calcification of the thoracic aorta. The lungs are
clear of an acute process. No infiltrates, edema or effusions. No
worrisome pulmonary lesions. The bony structures are intact. Stable
surgical changes noted in the left upper quadrant.
IMPRESSION: No acute cardiopulmonary findings or worrisome pulmonary lesions.

## 2018-01-01 DIAGNOSIS — M25519 Pain in unspecified shoulder: Secondary | ICD-10-CM | POA: Insufficient documentation

## 2018-04-22 ENCOUNTER — Encounter: Payer: Self-pay | Admitting: Podiatry

## 2018-04-22 ENCOUNTER — Ambulatory Visit (INDEPENDENT_AMBULATORY_CARE_PROVIDER_SITE_OTHER): Payer: Medicare Other

## 2018-04-22 ENCOUNTER — Ambulatory Visit: Payer: Medicare Other | Admitting: Podiatry

## 2018-04-22 DIAGNOSIS — M2041 Other hammer toe(s) (acquired), right foot: Secondary | ICD-10-CM | POA: Diagnosis not present

## 2018-04-22 DIAGNOSIS — M2042 Other hammer toe(s) (acquired), left foot: Secondary | ICD-10-CM

## 2018-04-22 NOTE — Patient Instructions (Signed)
Hammer Toe  Hammer toe is a change in the shape (a deformity) of your toe. The deformity causes the middle joint of your toe to stay bent. This causes pain, especially when you are wearing shoes. Hammer toe starts gradually. At first, the toe can be straightened. Gradually over time, the deformity becomes stiff and permanent. Early treatments to keep the toe straight may relieve pain. As the deformity becomes stiff and permanent, surgery may be needed to straighten the toe. What are the causes? Hammer toe is caused by abnormal bending of the toe joint that is closest to your foot. It happens gradually over time. This pulls on the muscles and connections (tendons) of the toe joint, making them weak and stiff. It is often related to wearing shoes that are too short or narrow and do not let your toes straighten. What increases the risk? You may be at greater risk for hammer toe if you:  Are male.  Are older.  Wear shoes that are too small.  Wear high-heeled shoes that pinch your toes.  Are a ballet dancer.  Have a second toe that is longer than your big toe (first toe).  Injure your foot or toe.  Have arthritis.  Have a family history of hammer toe.  Have a nerve or muscle disorder. What are the signs or symptoms? The main symptoms of this condition are pain and deformity of the toe. The pain is worse when wearing shoes, walking, or running. Other symptoms may include:  Corns or calluses over the bent part of the toe or between the toes.  Redness and a burning feeling on the toe.  An open sore that forms on the top of the toe.  Not being able to straighten the toe. How is this diagnosed? This condition is diagnosed based on your symptoms and a physical exam. During the exam, your health care provider will try to straighten your toe to see how stiff the deformity is. You may also have tests, such as:  A blood test to check for rheumatoid arthritis.  An X-ray to show how  severe the deformity is. How is this treated? Treatment for this condition will depend on how stiff the deformity is. Surgery is often needed. However, sometimes a hammer toe can be straightened without surgery. Treatments that do not involve surgery include:  Taping the toe into a straightened position.  Using pads and cushions to protect the toe (orthotics).  Wearing shoes that provide enough room for the toes.  Doing toe-stretching exercises at home.  Taking an NSAID to reduce pain and swelling. If these treatments do not help or the toe cannot be straightened, surgery is the next option. The most common surgeries used to straighten a hammer toe include:  Arthroplasty. In this procedure, part of the joint is removed, and that allows the toe to straighten.  Fusion. In this procedure, cartilage between the two bones of the joint is taken out and the bones are fused together into one longer bone.  Implantation. In this procedure, part of the bone is removed and replaced with an implant to let the toe move again.  Flexor tendon transfer. In this procedure, the tendons that curl the toes down (flexor tendons) are repositioned. Follow these instructions at home:  Take over-the-counter and prescription medicines only as told by your health care provider.  Do toe straightening and stretching exercises as told by your health care provider.  Keep all follow-up visits as told by your health care   provider. This is important. How is this prevented?  Wear shoes that give your toes enough room and do not cause pain.  Do not wear high-heeled shoes. Contact a health care provider if:  Your pain gets worse.  Your toe becomes red or swollen.  You develop an open sore on your toe. This information is not intended to replace advice given to you by your health care provider. Make sure you discuss any questions you have with your health care provider. Document Released: 03/14/2000 Document  Revised: 10/13/2016 Document Reviewed: 07/11/2015 Elsevier Interactive Patient Education  2019 [employer]  

## 2018-04-22 NOTE — Progress Notes (Signed)
   Subjective:    Patient ID: Glenn Carol., male    DOB: [birth year], 85 y.o.   MRN: [id number]  HPI    Review of Systems  All other systems reviewed and are negative.      Objective:   Physical Exam        Assessment & Plan:

## 2018-06-30 NOTE — H&P (Addendum)
TOTAL KNEE ADMISSION H&P  Patient is being admitted for right total knee arthroplasty.  Subjective:  Chief Complaint:   Right knee primary OA  pain.  HPI: Glenn Lawes., 85 y.o. male, has a history of pain and functional disability in the right knee due to arthritis and has failed non-surgical conservative treatments for greater than 12 weeks to include NSAID's and/or analgesics, corticosteriod injections, viscosupplementation injections and activity modification.  Onset of symptoms was gradual, starting  years ago with gradually worsening course since that time. The patient noted no past surgery on the right knee(s).  Patient currently rates pain in the right knee(s) at 9 out of 10 with activity. Patient has night pain, worsening of pain with activity and weight bearing, pain that interferes with activities of daily living, pain with passive range of motion, crepitus and joint swelling.  Patient has evidence of periarticular osteophytes and joint space narrowing by imaging studies.  There is no active infection.   Risks, benefits and expectations were discussed with the patient.  Risks including but not limited to the risk of anesthesia, blood clots, nerve damage, blood vessel damage, failure of the prosthesis, infection and up to and including death.  Patient understand the risks, benefits and expectations and wishes to proceed with surgery.   PCP: Glenn Orn, MD  D/C Plans:       Home   Post-op Meds:       No Rx given   Tranexamic Acid:      To be given - IV   Decadron:      Is to be given  FYI:      ASA  Norco  Known CKD (one kidney) (known elevated BUN and Creat)  DME:    Rx given for - RW (Rx sent)  PT:   OPPT   Pharmacy: [facility] [place]   Patient Active Problem List   Diagnosis Date Noted  . Shoulder pain 01/01/2018  . Osteoarthritis of left knee 07/20/2017  . Osteoarthritis of knee 04/16/2017  . Pain in left knee 04/16/2017  . Upper GI bleed 04/20/2012  .  Anemia 04/20/2012  . Chest pain on exertion 04/20/2012  . Hx of unilateral nephrectomy 04/20/2012  . CKD (chronic kidney disease) stage 3, GFR 30-59 ml/min (HCC) 04/20/2012  . Elevated troponin 04/20/2012  . CAD (coronary artery disease)   . External hemorrhoids 04/14/2011   Past Medical History:  Diagnosis Date  . CAD (coronary artery disease)    2007, inferior ischemia on nuclear scan, catheterization showed  60-70% ostial PDA, 90% distal RCA and a very large right coronary artery ( vision stent was placed 3.5 x 23 mm), 80-90% diagonal ( small vessel, wire could not be passed patient followed medically)  . Cancer ([place])    kidney  . Gout     Past Surgical History:  Procedure Laterality Date  . CORONARY ANGIOPLASTY WITH STENT PLACEMENT  2006  . ESOPHAGOGASTRODUODENOSCOPY  04/20/2012   Procedure: ESOPHAGOGASTRODUODENOSCOPY (EGD);  Surgeon: Wonda Horner, MD;  Location: [facility] ENDOSCOPY;  Service: Endoscopy;  Laterality: N/A;  . NEPHRECTOMY      No current facility-administered medications for this encounter.    Current Outpatient Medications  Medication Sig Dispense Refill Last Dose  . allopurinol (ZYLOPRIM) 300 MG tablet Take 150 mg by mouth daily.   Taking  . Multiple Vitamins-Minerals (MULTIVITAMIN ADULT PO) Take 1 tablet by mouth daily.   Taking  . triamterene-hydrochlorothiazide (MAXZIDE-25) 37.5-25 MG tablet triamterene 37.5 mg-hydrochlorothiazide 25 mg  tablet   Taking   Allergies  Allergen Reactions  . Tetracyclines & Related Other (See Comments)    Infection of penis    Social History   Tobacco Use  . Smoking status: Current Every Day Smoker    Packs/day: 0.25    Types: Cigarettes  . Smokeless tobacco: Never Used  Substance Use Topics  . Alcohol use: Yes    Alcohol/week: 7.0 standard drinks    Types: 7 Glasses of wine per week       Review of Systems  Constitutional: Negative.   HENT: Negative.   Eyes: Negative.   Respiratory: Positive for cough.    Cardiovascular: Negative.   Gastrointestinal: Negative.   Genitourinary: Negative.   Musculoskeletal: Positive for joint pain.  Skin: Negative.   Neurological: Negative.   Endo/Heme/Allergies: Negative.   Psychiatric/Behavioral: Negative.     Objective:  Physical Exam  Constitutional: He is oriented to person, place, and time. He appears well-developed.  HENT:  Head: Normocephalic.  Eyes: Pupils are equal, round, and reactive to light.  Neck: Neck supple. No JVD present. No tracheal deviation present. No thyromegaly present.  Cardiovascular: Normal rate, regular rhythm and intact distal pulses.  Respiratory: Effort normal and breath sounds normal. No respiratory distress. He has no wheezes.  GI: Soft. There is no abdominal tenderness. There is no guarding.  Musculoskeletal:     Right knee: He exhibits decreased range of motion, swelling and bony tenderness. He exhibits no ecchymosis, no deformity, no laceration and no erythema. Tenderness found.  Lymphadenopathy:    He has no cervical adenopathy.  Neurological: He is alert and oriented to person, place, and time.  Skin: Skin is warm and dry.  Psychiatric: He has a normal mood and affect.     Labs:  Estimated body mass index is 29.05 kg/m as calculated from the following:   Height as of 05/23/16: 5\' 6"  (1.676 m).   Weight as of 05/23/16: 81.6 kg.   Imaging Review Plain radiographs demonstrate severe degenerative joint disease of the right knee(s).  The bone quality appears to be good for age and reported activity level.      Assessment/Plan:  End stage arthritis, right knee   The patient history, physical examination, clinical judgment of the provider and imaging studies are consistent with end stage degenerative joint disease of the right knee and total knee arthroplasty is deemed medically necessary. The treatment options including medical management, injection therapy arthroscopy and arthroplasty were discussed at  length. The risks and benefits of total knee arthroplasty were presented and reviewed. The risks due to aseptic loosening, infection, stiffness, patella tracking problems, thromboembolic complications and other imponderables were discussed. The patient acknowledged the explanation, agreed to proceed with the plan and consent was signed. Patient is being admitted for inpatient treatment for surgery, pain control, PT, OT, prophylactic antibiotics, VTE prophylaxis, progressive ambulation and ADL's and discharge planning. The patient is planning to be discharged home.    Anticipated LOS equal to or greater than 2 midnights due to - Age 62 and older with one or more of the following:  - Obesity  - Expected need for hospital services (PT, OT, Nursing) required for safe  discharge  - Anticipated need for postoperative skilled nursing care or inpatient rehab  - Active co-morbidities: Coronary Artery Disease and CKD    Glenn Hunt. Glenn Levesque   PA-C  06/30/2018, 12:21 PM

## 2018-07-22 ENCOUNTER — Ambulatory Visit (HOSPITAL_COMMUNITY): Admission: RE | Admit: 2018-07-22 | Payer: Medicare Other | Source: Home / Self Care | Admitting: Orthopedic Surgery

## 2018-07-22 ENCOUNTER — Encounter (HOSPITAL_COMMUNITY): Admission: RE | Payer: Self-pay | Source: Home / Self Care

## 2018-07-22 SURGERY — ARTHROPLASTY, KNEE, TOTAL
Anesthesia: Spinal | Laterality: Right

## 2018-08-11 NOTE — Progress Notes (Signed)
Need surgery orders for 5-28 in epic

## 2018-08-17 NOTE — Patient Instructions (Addendum)
YOU ARE REQUIRED TO BE TESTED FOR COVID-19 PRIOR TO YOUR SURGERY . YOUR TEST MUST BE COMPLETED ON Tuesday Aug 24, 2018. TESTING IS LOCATED AT [place] ENTRANCE FROM 9:00AM - 3:00PM. FAILURE TO COMPLETE TESTING MAY RESULT IN CANCELLATION OF YOUR SURGERY.                  Glenn Hunt.  08/17/2018   Your procedure is scheduled on: 08-26-2018   Report to [facility] Main  Entrance    Report to admitting at 9:00AM      Call this number if you have problems the morning of surgery [phone]     Remember: Do not eat food After Midnight. YOU MAY HAVE CLEAR LIQUIDS FROM MIDNIGHT UNTIL 4:30AM. At 4:30AM Please finish the prescribed Pre-Surgery ENSURE drink. Nothing by mouth after you finish the ENSURE drink !   BRUSH YOUR TEETH MORNING OF SURGERY AND RINSE YOUR MOUTH OUT, NO CHEWING GUM CANDY OR MINTS.      CLEAR LIQUID DIET   Foods Allowed                                                                     Foods Excluded  Coffee and tea, regular and decaf                             liquids that you cannot  Plain Jell-O in any flavor                                             see through such as: Fruit ices (not with fruit pulp)                                     milk, soups, orange juice  Iced Popsicles                                    All solid food Carbonated beverages, regular and diet                                    Cranberry, grape and apple juices Sports drinks like Gatorade Lightly seasoned clear broth or consume(fat free) Sugar, honey syrup  Sample Menu Breakfast                                Lunch                                     Supper Cranberry juice                    Beef broth  Chicken broth Jell-O                                     Grape juice                           Apple juice Coffee or tea                        Jell-O                                      Popsicle                                                 Coffee or tea                        Coffee or tea  _____________________________________________________________________       Take these medicines the morning of surgery with A SIP OF WATER: allopurinol, mucinex                                 You may not have any metal on your body including hair pins and              piercings  Do not wear jewelry, make-up, lotions, powders or perfumes, deodorant             Do not wear nail polish.  Do not shave  48 hours prior to surgery.              Men may shave face and neck.   Do not bring valuables to the hospital. Glenn Hunt.  Contacts, dentures or bridgework may not be worn into surgery.                Please read over the following fact sheets you were given: _____________________________________________________________________             [facility] - Preparing for Surgery Before surgery, you can play an important role.  Because skin is not sterile, your skin needs to be as free of germs as possible.  You can reduce the number of germs on your skin by washing with CHG (chlorahexidine gluconate) soap before surgery.  CHG is an antiseptic cleaner which kills germs and bonds with the skin to continue killing germs even after washing. Please DO NOT use if you have an allergy to CHG or antibacterial soaps.  If your skin becomes reddened/irritated stop using the CHG and inform your nurse when you arrive at Short Stay. Do not shave (including legs and underarms) for at least 48 hours prior to the first CHG shower.  You may shave your face/neck. Please follow these instructions carefully:  1.  Shower with CHG Soap the night before surgery and the  morning of Surgery.  2.  If you choose to wash your hair, wash your hair first as usual with your  normal  shampoo.  3.  After you shampoo, rinse your hair and body thoroughly to remove the  shampoo.                            4.  Use CHG as you would any other liquid soap.  You can apply chg directly  to the skin and wash                       Gently with a scrungie or clean washcloth.  5.  Apply the CHG Soap to your body ONLY FROM THE NECK DOWN.   Do not use on face/ open                           Wound or open sores. Avoid contact with eyes, ears mouth and genitals (private parts).                       Wash face,  Genitals (private parts) with your normal soap.             6.  Wash thoroughly, paying special attention to the area where your surgery  will be performed.  7.  Thoroughly rinse your body with warm water from the neck down.  8.  DO NOT shower/wash with your normal soap after using and rinsing off  the CHG Soap.                9.  Pat yourself dry with a clean towel.            10.  Wear clean pajamas.            11.  Place clean sheets on your bed the night of your first shower and do not  sleep with pets. Day of Surgery : Do not apply any lotions/deodorants the morning of surgery.  Please wear clean clothes to the hospital/surgery center.  FAILURE TO FOLLOW THESE INSTRUCTIONS MAY RESULT IN THE CANCELLATION OF YOUR SURGERY PATIENT SIGNATURE_________________________________  NURSE SIGNATURE__________________________________  ________________________________________________________________________   Glenn Hunt  An incentive spirometer is a tool that can help keep your lungs clear and active. This tool measures how well you are filling your lungs with each breath. Taking long deep breaths may help reverse or decrease the chance of developing breathing (pulmonary) problems (especially infection) following:  A long period of time when you are unable to move or be active. BEFORE THE PROCEDURE   If the spirometer includes an indicator to show your best effort, your nurse or respiratory therapist will set it to a desired goal.  If possible, sit up straight or lean slightly  forward. Try not to slouch.  Hold the incentive spirometer in an upright position. INSTRUCTIONS FOR USE  1. Sit on the edge of your bed if possible, or sit up as far as you can in bed or on a chair. 2. Hold the incentive spirometer in an upright position. 3. Breathe out normally. 4. Place the mouthpiece in your mouth and seal your lips tightly around it. 5. Breathe in slowly and as deeply as possible, raising the piston or the ball toward the top of the column. 6. Hold your breath for 3-5 seconds or for as long as possible. Allow the piston or ball to fall to the bottom of the column. 7. Remove the mouthpiece from your mouth and breathe out  normally. 8. Rest for a few seconds and repeat Steps 1 through 7 at least 10 times every 1-2 hours when you are awake. Take your time and take a few normal breaths between deep breaths. 9. The spirometer may include an indicator to show your best effort. Use the indicator as a goal to work toward during each repetition. 10. After each set of 10 deep breaths, practice coughing to be sure your lungs are clear. If you have an incision (the cut made at the time of surgery), support your incision when coughing by placing a pillow or rolled up towels firmly against it. Once you are able to get out of bed, walk around indoors and cough well. You may stop using the incentive spirometer when instructed by your caregiver.  RISKS AND COMPLICATIONS  Take your time so you do not get dizzy or light-headed.  If you are in pain, you may need to take or ask for pain medication before doing incentive spirometry. It is harder to take a deep breath if you are having pain. AFTER USE  Rest and breathe slowly and easily.  It can be helpful to keep track of a log of your progress. Your caregiver can provide you with a simple table to help with this. If you are using the spirometer at home, follow these instructions: [place] IF:   You are having difficultly using the  spirometer.  You have trouble using the spirometer as often as instructed.  Your pain medication is not giving enough relief while using the spirometer.  You develop fever of 100.5 F (38.1 C) or higher. SEEK IMMEDIATE MEDICAL CARE IF:   You cough up bloody sputum that had not been present before.  You develop fever of 102 F (38.9 C) or greater.  You develop worsening pain at or near the incision site. MAKE SURE YOU:   Understand these instructions.  Will watch your condition.  Will get help right away if you are not doing well or get worse. Document Released: 07/28/2006 Document Revised: 06/09/2011 Document Reviewed: 09/28/2006 Exit[employer] Patient Information 2014 Exit[employer], [place].   ________________________________________________________________________  WHAT IS A BLOOD TRANSFUSION? Blood Transfusion Information  A transfusion is the replacement of blood or some of its parts. Blood is made up of multiple cells which provide different functions.  Red blood cells carry oxygen and are used for blood loss replacement.  White blood cells fight against infection.  Platelets control bleeding.  Plasma helps clot blood.  Other blood products are available for specialized needs, such as hemophilia or other clotting disorders. BEFORE THE TRANSFUSION  Who gives blood for transfusions?   Healthy volunteers who are fully evaluated to make sure their blood is safe. This is blood bank blood. Transfusion therapy is the safest it has ever been in the practice of medicine. Before blood is taken from a donor, a complete history is taken to make sure that person has no history of diseases nor engages in risky social behavior (examples are intravenous drug use or sexual activity with multiple partners). The donor's travel history is screened to minimize risk of transmitting infections, such as malaria. The donated blood is tested for signs of infectious diseases, such as HIV and hepatitis.  The blood is then tested to be sure it is compatible with you in order to minimize the chance of a transfusion reaction. If you or a relative donates blood, this is often done in anticipation of surgery and is not appropriate for emergency situations.  It takes many days to process the donated blood. RISKS AND COMPLICATIONS Although transfusion therapy is very safe and saves many lives, the main dangers of transfusion include:   Getting an infectious disease.  Developing a transfusion reaction. This is an allergic reaction to something in the blood you were given. Every precaution is taken to prevent this. The decision to have a blood transfusion has been considered carefully by your caregiver before blood is given. Blood is not given unless the benefits outweigh the risks. AFTER THE TRANSFUSION  Right after receiving a blood transfusion, you will usually feel much better and more energetic. This is especially true if your red blood cells have gotten low (anemic). The transfusion raises the level of the red blood cells which carry oxygen, and this usually causes an energy increase.  The nurse administering the transfusion will monitor you carefully for complications. HOME CARE INSTRUCTIONS  No special instructions are needed after a transfusion. You may find your energy is better. Speak with your caregiver about any limitations on activity for underlying diseases you may have. SEEK MEDICAL CARE IF:   Your condition is not improving after your transfusion.  You develop redness or irritation at the intravenous (IV) site. SEEK IMMEDIATE MEDICAL CARE IF:  Any of the following symptoms occur over the next 12 hours:  Shaking chills.  You have a temperature by mouth above 102 F (38.9 C), not controlled by medicine.  Chest, back, or muscle pain.  People around you feel you are not acting correctly or are confused.  Shortness of breath or difficulty breathing.  Dizziness and fainting.  You  get a rash or develop hives.  You have a decrease in urine output.  Your urine turns a dark color or changes to pink, red, or brown. Any of the following symptoms occur over the next 10 days:  You have a temperature by mouth above 102 F (38.9 C), not controlled by medicine.  Shortness of breath.  Weakness after normal activity.  The white part of the eye turns yellow (jaundice).  You have a decrease in the amount of urine or are urinating less often.  Your urine turns a dark color or changes to pink, red, or brown. Document Released: 03/14/2000 Document Revised: 06/09/2011 Document Reviewed: 11/01/2007 [facility] Patient Information 2014 [place], [place].  _______________________________________________________________________

## 2018-08-17 NOTE — Progress Notes (Signed)
SPOKE W/  _ PATIENT    DENIES THE FOLLOWING BELOW    SCREENING SYMPTOMS OF COVID 19:   COUGH--  RUNNY NOSE---   SORE THROAT---  NASAL CONGESTION----  SNEEZING----  SHORTNESS OF BREATH---  DIFFICULTY BREATHING---  TEMP >100.0 -----  UNEXPLAINED BODY ACHES------  CHILLS --------   HEADACHES ---------  LOSS OF SMELL/ TASTE --------    HAVE YOU OR ANY FAMILY MEMBER TRAVELLED PAST 14 DAYS OUT OF THE   COUNTY--- STATE---- COUNTRY----  HAVE YOU OR ANY FAMILY MEMBER BEEN EXPOSED TO ANYONE WITH COVID 19?

## 2018-08-19 ENCOUNTER — Encounter (HOSPITAL_COMMUNITY)
Admission: RE | Admit: 2018-08-19 | Discharge: 2018-08-19 | Disposition: A | Discharge status: Home / Self Care | Payer: Medicare Other | Source: Ambulatory Visit | Attending: Orthopedic Surgery | Admitting: Orthopedic Surgery

## 2018-08-19 ENCOUNTER — Other Ambulatory Visit: Payer: Self-pay

## 2018-08-19 ENCOUNTER — Encounter (HOSPITAL_COMMUNITY): Payer: Self-pay

## 2018-08-19 DIAGNOSIS — M1711 Unilateral primary osteoarthritis, right knee: Secondary | ICD-10-CM | POA: Insufficient documentation

## 2018-08-19 DIAGNOSIS — Z01818 Encounter for other preprocedural examination: Secondary | ICD-10-CM | POA: Insufficient documentation

## 2018-08-19 DIAGNOSIS — I2510 Atherosclerotic heart disease of native coronary artery without angina pectoris: Secondary | ICD-10-CM | POA: Insufficient documentation

## 2018-08-19 HISTORY — DX: Personal history of other medical treatment: Z92.89

## 2018-08-19 HISTORY — DX: Unspecified malignant neoplasm of skin, unspecified: C44.90

## 2018-08-19 HISTORY — DX: Acquired absence of kidney: Z90.5

## 2018-08-19 HISTORY — DX: Dieulafoy lesion (hemorrhagic) of stomach and duodenum: K31.82

## 2018-08-19 HISTORY — DX: Asymptomatic varicose veins of bilateral lower extremities: I83.93

## 2018-08-19 LAB — BASIC METABOLIC PANEL
Anion gap: 9 (ref 5–15)
BUN: 33 mg/dL — ABNORMAL HIGH (ref 8–23)
CO2: 27 mmol/L (ref 22–32)
Calcium: 10.2 mg/dL (ref 8.9–10.3)
Chloride: 102 mmol/L (ref 98–111)
Creatinine, Ser: 1.26 mg/dL — ABNORMAL HIGH (ref 0.61–1.24)
GFR calc Af Amer: >60 mL/min (ref >60)
GFR calc non Af Amer: 52 mL/min — ABNORMAL LOW (ref >60)
Glucose, Bld: 114 mg/dL — ABNORMAL HIGH (ref 70–99)
Potassium: 4 mmol/L (ref 3.5–5.1)
Sodium: 138 mmol/L (ref 135–145)

## 2018-08-19 LAB — CBC
HCT: 42.5 % (ref 39.0–52.0)
Hemoglobin: 14.1 g/dL (ref 13.0–17.0)
MCH: 35.1 pg — ABNORMAL HIGH (ref 26.0–34.0)
MCHC: 33.2 g/dL (ref 30.0–36.0)
MCV: 105.7 fL — ABNORMAL HIGH (ref 80.0–100.0)
Platelets: 186 10*3/uL (ref 150–400)
RBC: 4.02 MIL/uL — ABNORMAL LOW (ref 4.22–5.81)
RDW: 14 % (ref 11.5–15.5)
WBC: 6.2 10*3/uL (ref 4.0–10.5)
nRBC: 0 % (ref 0.0–0.2)

## 2018-08-19 LAB — ABO/RH: ABO/RH(D): O POS

## 2018-08-19 LAB — SURGICAL PCR SCREEN
MRSA, PCR: NEGATIVE
Staphylococcus aureus: NEGATIVE

## 2018-08-19 NOTE — Progress Notes (Addendum)
ekg received from Grayling tannenbaum 06-14-2018 , placed on chart   Cbcdiff, cmp on chart 08-11-2018 brought by pt to pre-op appt

## 2018-08-20 NOTE — Progress Notes (Signed)
Anesthesia Chart Review   Case:  [id number] Date/Time:  08/26/18 1125   Procedure:  TOTAL KNEE ARTHROPLASTY (Right ) - 70 mins   Anesthesia type:  Spinal   Pre-op diagnosis:  Right knee osteoarthritis   Location:  [place] [facility] / [facility] ORS   Surgeon:  Paralee Cancel, MD      DISCUSSION: 85 yo current every day smoker with h/o CAD (stent 2006), kidney cancer s/p nephrectomy, right knee OA scheduled for above procedure 08/26/2018 with Dr. Paralee Cancel.   Pt denies chest pain, shortness of breath, palpitations, lower extremity edema.  He has good exercise tolerance and reports he works out with a trainer 2-3 times per week.  CAD followed by PCP, Dr. Lavone Orn.  Pt is a retired Administrator, [employer].   Pt can proceed with planned procedure barring acute status change.  VS: BP (!) 164/68 (BP Location: Left Arm)   Pulse 67   Temp 37.1 C (Oral)   Resp 18   Ht 5\' 6"  (1.676 m)   Wt 82.8 kg   SpO2 98%   BMI 29.45 kg/m   PROVIDERS: Lavone Orn, MD is PCP    LABS: Labs reviewed: Acceptable for surgery. (all labs ordered are listed, but only abnormal results are displayed)  Labs Reviewed  CBC - Abnormal; Notable for the following components:      Result Value   RBC 4.02 (*)    MCV 105.7 (*)    MCH 35.1 (*)    All other components within normal limits  BASIC METABOLIC PANEL - Abnormal; Notable for the following components:   Glucose, Bld 114 (*)    BUN 33 (*)    Creatinine, Ser 1.26 (*)    GFR calc non Af Amer 52 (*)    All other components within normal limits  SURGICAL PCR SCREEN  TYPE AND SCREEN  ABO/RH     IMAGES:   EKG: 06/14/2018 (on chart) Rate 56 bpm Sinus bradycardia  Nonspecific QRS widening  Negative T waves possible anterior ischemia  CV:  Past Medical History:  Diagnosis Date  . CAD (coronary artery disease)    2007, inferior ischemia on nuclear scan, catheterization showed  60-70% ostial PDA, 90% distal RCA and a very large right coronary artery ( vision stent was  placed 3.5 x 23 mm), 80-90% diagonal ( small vessel, wire could not be passed patient followed medically)  . 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 Cancer ([place])    kidney  . Dieulafoy lesion of stomach    incidence of bleeds in 2015 and 2017 , required blood transfusion as bleed lef to a 4.5 hemoglobin  . Gout   . H/O right nephrectomy   . History of blood transfusion   . Skin cancer    skin , squamos cell   . Varicose veins of legs    bialtera; "ive had them about 5 years now and theyve never been a problem"     Past Surgical History:  Procedure Laterality Date  . CATARACT EXTRACTION, BILATERAL  2017  . CORONARY ANGIOPLASTY WITH STENT PLACEMENT  2006  . ESOPHAGOGASTRODUODENOSCOPY  04/20/2012   Procedure: ESOPHAGOGASTRODUODENOSCOPY (EGD);  Surgeon: Wonda Horner, MD;  Location: [facility] ENDOSCOPY;  Service: Endoscopy;  Laterality: N/A;  . NEPHRECTOMY Right    REPORTS AVG CREATININE 1.4 FOR THE LAST 20 YEARS     MEDICATIONS: . allopurinol (ZYLOPRIM) 300 MG tablet  . dextromethorphan-guaiFENesin (MUCINEX DM) 30-600 MG 12hr tablet  . Multiple Vitamins-Minerals (MULTIVITAMIN ADULT PO)  . torsemide (DEMADEX) 10 MG tablet  . triamterene-hydrochlorothiazide (MAXZIDE-25) 37.5-25 MG tablet   No current facility-administered medications for this encounter.     Maia Plan [facility] Pre-Surgical Testing [phone] 08/20/18 2:16 PM

## 2018-08-20 NOTE — Anesthesia Preprocedure Evaluation (Addendum)
Anesthesia Evaluation  Patient identified by MRN, date of birth, ID band Patient awake    Reviewed: Allergy & Precautions, NPO status , Patient's Chart, lab work & pertinent test results  Airway Mallampati: II  TM Distance: >3 FB Neck ROM: Full    Dental  (+) Caps, Dental Advisory Given   Pulmonary Current Smoker,    breath sounds clear to auscultation       Cardiovascular hypertension, Pt. on medications (-) angina+ CAD and + Cardiac Stents   Rhythm:Regular Rate:Normal  Very active: walks, bikes   Neuro/Psych negative neurological ROS     GI/Hepatic Neg liver ROS, GERD  Medicated and Controlled,  Endo/Other  negative endocrine ROS  Renal/GU Renal InsufficiencyRenal disease (creat 1.26)H/o renal cancer: nephrectomy     Musculoskeletal  (+) Arthritis , Osteoarthritis,    Abdominal   Peds  Hematology negative hematology ROS (+)   Anesthesia Other Findings   Reproductive/Obstetrics                           Anesthesia Physical Anesthesia Plan  ASA: III  Anesthesia Plan: Spinal   Post-op Pain Management:  Regional for Post-op pain   Induction: Intravenous  PONV Risk Score and Plan: 1 and Treatment may vary due to age or medical condition, Ondansetron and Dexamethasone  Airway Management Planned: Natural Airway and Simple Face Mask  Additional Equipment:   Intra-op Plan:   Post-operative Plan:   Informed Consent: I have reviewed the patients History and Physical, chart, labs and discussed the procedure including the risks, benefits and alternatives for the proposed anesthesia with the patient or authorized representative who has indicated his/her understanding and acceptance.   Patient has DNR.  Discussed DNR with patient and Suspend DNR.   Dental advisory given  Plan Discussed with: CRNA and Surgeon  Anesthesia Plan Comments: (See PAT note 08/19/2018, Konrad Felix,  PA-C Plan routine monitors, SA with adductor canal block for post op analgesia)     Anesthesia Quick Evaluation

## 2018-08-23 NOTE — H&P (Signed)
TOTAL KNEE ADMISSION H&P  Patient is being admitted for right total knee arthroplasty.  Subjective:  Chief Complaint:   Right knee primary OA / pain  HPI: Glenn Carol., 85 y.o. male, has a history of pain and functional disability in the right knee due to arthritis and has failed non-surgical conservative treatments for greater than 12 weeks to include NSAID's and/or analgesics, corticosteriod injections, viscosupplementation injections and activity modification.  Onset of symptoms was gradual, starting 2+ years ago with gradually worsening course since that time. The patient noted no past surgery on the right knee(s).  Patient currently rates pain in the right knee(s) at 9 out of 10 with activity. Patient has night pain, worsening of pain with activity and weight bearing, pain that interferes with activities of daily living, pain with passive range of motion, crepitus and joint swelling.  Patient has evidence of periarticular osteophytes and joint space narrowing by imaging studies. There is no active infection.   Risks, benefits and expectations were discussed with the patient.  Risks including but not limited to the risk of anesthesia, blood clots, nerve damage, blood vessel damage, failure of the prosthesis, infection and up to and including death.  Patient understand the risks, benefits and expectations and wishes to proceed with surgery.   PCP: Glenn Orn, MD  D/C Plans:       Home   Post-op Meds:       No Rx given   Tranexamic Acid:      To be given - IV   Decadron:      Is to be given  FYI:      ASA  Norco  No Celebrex - 1 kidney  DME:   Pt already has equipment  PT:   OPPT Rx given  Pharmacy:  [facility]   Patient Active Problem List   Diagnosis Date Noted  . Shoulder pain 01/01/2018  . Osteoarthritis of left knee 07/20/2017  . Osteoarthritis of knee 04/16/2017  . Pain in left knee 04/16/2017  . Upper GI bleed 04/20/2012  . Anemia 04/20/2012  . Chest  pain on exertion 04/20/2012  . Hx of unilateral nephrectomy 04/20/2012  . CKD (chronic kidney disease) stage 3, GFR 30-59 ml/min (HCC) 04/20/2012  . Elevated troponin 04/20/2012  . CAD (coronary artery disease)   . External hemorrhoids 04/14/2011   Past Medical History:  Diagnosis Date  . CAD (coronary artery disease)    2007, inferior ischemia on nuclear scan, catheterization showed  60-70% ostial PDA, 90% distal RCA and a very large right coronary artery ( vision stent was placed 3.5 x 23 mm), 80-90% diagonal ( small vessel, wire could not be passed patient followed medically)  . 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 Cancer ([place])    kidney  . Dieulafoy lesion of stomach    incidence of bleeds in 2015 and 2017 , required blood transfusion as bleed lef to a 4.5 hemoglobin  . Gout   . H/O right nephrectomy   . History of blood transfusion   . Skin cancer    skin , squamos cell   .  Varicose veins of legs    bialtera; "ive had them about 5 years now and theyve never been a problem"     Past Surgical History:  Procedure Laterality Date  . CATARACT EXTRACTION, BILATERAL  2017  . CORONARY ANGIOPLASTY WITH STENT PLACEMENT  2006  . ESOPHAGOGASTRODUODENOSCOPY  04/20/2012   Procedure: ESOPHAGOGASTRODUODENOSCOPY (EGD);  Surgeon: Wonda Horner, MD;  Location: [facility] ENDOSCOPY;  Service: Endoscopy;  Laterality: N/A;  . NEPHRECTOMY Right    REPORTS AVG CREATININE 1.4 FOR THE LAST 20 YEARS     No current facility-administered medications for this encounter.    Current Outpatient Medications  Medication Sig Dispense Refill Last Dose  . allopurinol (ZYLOPRIM) 300 MG tablet Take 300 mg by mouth  daily.    Taking  . dextromethorphan-guaiFENesin (MUCINEX DM) 30-600 MG 12hr tablet Take 1 tablet by mouth every morning.     . Multiple Vitamins-Minerals (MULTIVITAMIN ADULT PO) Take 1 tablet by mouth daily.   Taking  . torsemide (DEMADEX) 10 MG tablet Take 5 mg by mouth daily as needed (fluid).     . triamterene-hydrochlorothiazide (MAXZIDE-25) 37.5-25 MG tablet Take 1 tablet by mouth daily.    Taking   Allergies  Allergen Reactions  . Tetracyclines & Related Other (See Comments)    Infection of penis    Social History   Tobacco Use  . Smoking status: Current Every Day Smoker    Packs/day: 0.25    Types: Cigarettes  . Smokeless tobacco: Never Used  . Tobacco comment: 7 cigarettes a day fo the last 10 years   Substance Use Topics  . Alcohol use: Yes    Alcohol/week: 7.0 standard drinks    Types: 7 Glasses of wine per week    Comment: daily        Review of Systems  Constitutional: Negative.   HENT: Negative.   Eyes: Negative.   Respiratory: Positive for cough.   Cardiovascular: Negative.   Gastrointestinal: Negative.   Genitourinary: Positive for frequency.  Musculoskeletal: Positive for joint pain.  Skin: Negative.   Neurological: Negative.   Endo/Heme/Allergies: Negative.   Psychiatric/Behavioral: Negative.     Objective:  Physical Exam  Constitutional: He is oriented to person, place, and time. He appears well-developed.  HENT:  Head: Normocephalic.  Eyes: Pupils are equal, round, and reactive to light.  Neck: Neck supple. No JVD present. No tracheal deviation present. No thyromegaly present.  Cardiovascular: Normal rate, regular rhythm and intact distal pulses.  Respiratory: Effort normal and breath sounds normal. No respiratory distress. He has no wheezes.  GI: Soft. There is no abdominal tenderness. There is no guarding.  Musculoskeletal:     Right knee: He exhibits decreased range of motion, swelling and bony tenderness. He exhibits no ecchymosis, no  deformity, no laceration and no erythema. Tenderness found.  Lymphadenopathy:    He has no cervical adenopathy.  Neurological: He is alert and oriented to person, place, and time.  Skin: Skin is warm and dry.  Psychiatric: He has a normal mood and affect.      Labs:  Estimated body mass index is 29.45 kg/m as calculated from the following:   Height as of 08/19/18: 5\' 6"  (1.676 m).   Weight as of 08/19/18: 82.8 kg.   Imaging Review Plain radiographs demonstrate severe degenerative joint disease of the right knee.  The bone quality appears to be good for age and reported activity level.      Assessment/Plan:  End stage arthritis, right knee  The patient history, physical examination, clinical judgment of the provider and imaging studies are consistent with end stage degenerative joint disease of the right knee(s) and total knee arthroplasty is deemed medically necessary. The treatment options including medical management, injection therapy arthroscopy and arthroplasty were discussed at length. The risks and benefits of total knee arthroplasty were presented and reviewed. The risks due to aseptic loosening, infection, stiffness, patella tracking problems, thromboembolic complications and other imponderables were discussed. The patient acknowledged the explanation, agreed to proceed with the plan and consent was signed. Patient is being admitted for inpatient treatment for surgery, pain control, PT, OT, prophylactic antibiotics, VTE prophylaxis, progressive ambulation and ADL's and discharge planning. The patient is planning to be discharged home.    Patient's anticipated LOS is less than 2 midnights, meeting these requirements: - Lives within 1 hour of care - Has a competent adult at home to recover with post-op recover - NO history of  - Chronic pain requiring opiods  - Diabetes  - Heart failure  - Heart attack  - Stroke  - DVT/VTE  - Cardiac arrhythmia  - Respiratory  Failure/COPD  - Anemia  - Advanced Liver disease         West Pugh. Arley Garant   PA-C  08/23/2018, 10:12 PM

## 2018-08-24 ENCOUNTER — Other Ambulatory Visit (HOSPITAL_COMMUNITY)
Admission: RE | Admit: 2018-08-24 | Discharge: 2018-08-24 | Disposition: A | Discharge status: Home / Self Care | Payer: Medicare Other | Source: Ambulatory Visit | Attending: Orthopedic Surgery | Admitting: Orthopedic Surgery

## 2018-08-24 ENCOUNTER — Other Ambulatory Visit: Payer: Self-pay

## 2018-08-24 DIAGNOSIS — Z1159 Encounter for screening for other viral diseases: Secondary | ICD-10-CM | POA: Diagnosis present

## 2018-08-24 LAB — SARS CORONAVIRUS 2 BY RT PCR (HOSPITAL ORDER, PERFORMED IN ~~LOC~~ HOSPITAL LAB): SARS Coronavirus 2: NEGATIVE

## 2018-08-25 NOTE — Progress Notes (Signed)
SPOKE W/  _patient     SCREENING SYMPTOMS OF COVID 19:   COUGH--no  RUNNY NOSE--- no  SORE THROAT---no  NASAL CONGESTION----no  SNEEZING----no  SHORTNESS OF BREATH---no  DIFFICULTY BREATHING---no  TEMP >100.0 -----no  UNEXPLAINED BODY ACHES------no  CHILLS --------no   HEADACHES ---------no  LOSS OF SMELL/ TASTE --------no    HAVE YOU OR ANY FAMILY MEMBER TRAVELLED PAST 14 DAYS OUT OF THE   COUNTY---no STATE----no COUNTRY----no  HAVE YOU OR ANY FAMILY MEMBER BEEN EXPOSED TO ANYONE WITH COVID 19? no    

## 2018-08-26 ENCOUNTER — Encounter (HOSPITAL_COMMUNITY): Payer: Self-pay | Admitting: Certified Registered Nurse Anesthetist

## 2018-08-26 ENCOUNTER — Ambulatory Visit (HOSPITAL_COMMUNITY): Payer: Medicare Other | Admitting: Physician Assistant

## 2018-08-26 ENCOUNTER — Observation Stay (HOSPITAL_COMMUNITY)
Admission: RE | Admit: 2018-08-26 | Discharge: 2018-08-27 | Disposition: A | Discharge status: Home / Self Care | Payer: Medicare Other | Attending: Orthopedic Surgery | Admitting: Orthopedic Surgery

## 2018-08-26 ENCOUNTER — Other Ambulatory Visit: Payer: Self-pay

## 2018-08-26 ENCOUNTER — Encounter (HOSPITAL_COMMUNITY)
Admission: RE | Disposition: A | Discharge status: Home / Self Care | Payer: Self-pay | Source: Home / Self Care | Attending: Orthopedic Surgery

## 2018-08-26 ENCOUNTER — Ambulatory Visit (HOSPITAL_COMMUNITY): Payer: Medicare Other | Admitting: Certified Registered Nurse Anesthetist

## 2018-08-26 DIAGNOSIS — I2510 Atherosclerotic heart disease of native coronary artery without angina pectoris: Secondary | ICD-10-CM | POA: Diagnosis not present

## 2018-08-26 DIAGNOSIS — M109 Gout, unspecified: Secondary | ICD-10-CM | POA: Insufficient documentation

## 2018-08-26 DIAGNOSIS — N183 Chronic kidney disease, stage 3 (moderate): Secondary | ICD-10-CM | POA: Diagnosis not present

## 2018-08-26 DIAGNOSIS — Z85828 Personal history of other malignant neoplasm of skin: Secondary | ICD-10-CM | POA: Insufficient documentation

## 2018-08-26 DIAGNOSIS — D649 Anemia, unspecified: Secondary | ICD-10-CM | POA: Insufficient documentation

## 2018-08-26 DIAGNOSIS — Z905 Acquired absence of kidney: Secondary | ICD-10-CM | POA: Insufficient documentation

## 2018-08-26 DIAGNOSIS — F17210 Nicotine dependence, cigarettes, uncomplicated: Secondary | ICD-10-CM | POA: Insufficient documentation

## 2018-08-26 DIAGNOSIS — M659 Synovitis and tenosynovitis, unspecified: Secondary | ICD-10-CM | POA: Insufficient documentation

## 2018-08-26 DIAGNOSIS — M25461 Effusion, right knee: Secondary | ICD-10-CM | POA: Diagnosis not present

## 2018-08-26 DIAGNOSIS — Z85528 Personal history of other malignant neoplasm of kidney: Secondary | ICD-10-CM | POA: Insufficient documentation

## 2018-08-26 DIAGNOSIS — M25761 Osteophyte, right knee: Secondary | ICD-10-CM | POA: Diagnosis not present

## 2018-08-26 DIAGNOSIS — Z955 Presence of coronary angioplasty implant and graft: Secondary | ICD-10-CM | POA: Diagnosis not present

## 2018-08-26 DIAGNOSIS — M1711 Unilateral primary osteoarthritis, right knee: Principal | ICD-10-CM | POA: Insufficient documentation

## 2018-08-26 DIAGNOSIS — Z96651 Presence of right artificial knee joint: Secondary | ICD-10-CM

## 2018-08-26 DIAGNOSIS — Z79899 Other long term (current) drug therapy: Secondary | ICD-10-CM | POA: Diagnosis not present

## 2018-08-26 HISTORY — PX: TOTAL KNEE ARTHROPLASTY: SHX125

## 2018-08-26 LAB — TYPE AND SCREEN
ABO/RH(D): O POS
Antibody Screen: NEGATIVE

## 2018-08-26 SURGERY — ARTHROPLASTY, KNEE, TOTAL
Anesthesia: Spinal | Laterality: Right

## 2018-08-26 MED ORDER — ALLOPURINOL 300 MG PO TABS
300.0000 mg | ORAL_TABLET | Freq: Every day | ORAL | Status: DC
  Administered 2018-08-27: 300 mg via ORAL
  Filled 2018-08-26: qty 1

## 2018-08-26 MED ORDER — KETOROLAC TROMETHAMINE 30 MG/ML IJ SOLN
INTRAMUSCULAR | Status: AC
  Filled 2018-08-26: qty 1

## 2018-08-26 MED ORDER — SODIUM CHLORIDE 0.9 % IV SOLN
INTRAVENOUS | Status: DC
  Administered 2018-08-26 – 2018-08-27 (×2): via INTRAVENOUS

## 2018-08-26 MED ORDER — BISACODYL 10 MG RE SUPP: 10.0000 mg | Freq: Every day | RECTAL | Status: DC | PRN

## 2018-08-26 MED ORDER — BUPIVACAINE-EPINEPHRINE (PF) 0.25% -1:200000 IJ SOLN
INTRAMUSCULAR | Status: AC
  Filled 2018-08-26: qty 30

## 2018-08-26 MED ORDER — TRAMADOL HCL 50 MG PO TABS
50.0000 mg | ORAL_TABLET | Freq: Four times a day (QID) | ORAL | Status: DC
  Administered 2018-08-26 – 2018-08-27 (×4): 50 mg via ORAL
  Filled 2018-08-26 (×4): qty 1

## 2018-08-26 MED ORDER — ONDANSETRON HCL 4 MG/2ML IJ SOLN: 4.0000 mg | Freq: Four times a day (QID) | INTRAMUSCULAR | Status: DC | PRN

## 2018-08-26 MED ORDER — ONDANSETRON HCL 4 MG PO TABS: 4.0000 mg | ORAL_TABLET | Freq: Four times a day (QID) | ORAL | Status: DC | PRN

## 2018-08-26 MED ORDER — ASPIRIN 81 MG PO CHEW
81.0000 mg | CHEWABLE_TABLET | Freq: Two times a day (BID) | ORAL | Status: DC
  Administered 2018-08-26 – 2018-08-27 (×2): 81 mg via ORAL
  Filled 2018-08-26 (×2): qty 1

## 2018-08-26 MED ORDER — BUPIVACAINE-EPINEPHRINE (PF) 0.25% -1:200000 IJ SOLN
INTRAMUSCULAR | Status: DC | PRN
  Administered 2018-08-26: 30 mL via PERINEURAL

## 2018-08-26 MED ORDER — CHLORHEXIDINE GLUCONATE 4 % EX LIQD: 60.0000 mL | Freq: Once | CUTANEOUS | Status: DC

## 2018-08-26 MED ORDER — TRIAMTERENE-HCTZ 37.5-25 MG PO TABS
1.0000 | ORAL_TABLET | Freq: Every day | ORAL | Status: DC
  Administered 2018-08-26 – 2018-08-27 (×2): 1 via ORAL
  Filled 2018-08-26 (×2): qty 1

## 2018-08-26 MED ORDER — DEXAMETHASONE SODIUM PHOSPHATE 10 MG/ML IJ SOLN
INTRAMUSCULAR | Status: AC
  Filled 2018-08-26: qty 1

## 2018-08-26 MED ORDER — METHOCARBAMOL 500 MG PO TABS
500.0000 mg | ORAL_TABLET | Freq: Four times a day (QID) | ORAL | Status: DC | PRN
  Administered 2018-08-26 – 2018-08-27 (×2): 500 mg via ORAL
  Filled 2018-08-26 (×2): qty 1

## 2018-08-26 MED ORDER — SODIUM CHLORIDE 0.9 % IV SOLN
INTRAVENOUS | Status: DC | PRN
  Administered 2018-08-26: 15 ug/min via INTRAVENOUS

## 2018-08-26 MED ORDER — HYDROMORPHONE HCL 1 MG/ML IJ SOLN
0.5000 mg | INTRAMUSCULAR | Status: DC | PRN
  Administered 2018-08-26: 1 mg via INTRAVENOUS
  Filled 2018-08-26: qty 1

## 2018-08-26 MED ORDER — TORSEMIDE 10 MG PO TABS
5.0000 mg | ORAL_TABLET | Freq: Every day | ORAL | Status: DC | PRN
  Filled 2018-08-26: qty 0.5

## 2018-08-26 MED ORDER — ASPIRIN 81 MG PO CHEW: 81.0000 mg | CHEWABLE_TABLET | Freq: Two times a day (BID) | ORAL | 0 refills | Status: AC

## 2018-08-26 MED ORDER — MIDAZOLAM HCL 2 MG/2ML IJ SOLN
INTRAMUSCULAR | Status: AC
  Filled 2018-08-26: qty 2

## 2018-08-26 MED ORDER — ONDANSETRON HCL 4 MG/2ML IJ SOLN
INTRAMUSCULAR | Status: AC
  Filled 2018-08-26: qty 2

## 2018-08-26 MED ORDER — POLYETHYLENE GLYCOL 3350 17 G PO PACK
17.0000 g | PACK | Freq: Two times a day (BID) | ORAL | Status: DC
  Administered 2018-08-26 – 2018-08-27 (×2): 17 g via ORAL
  Filled 2018-08-26 (×2): qty 1

## 2018-08-26 MED ORDER — SODIUM CHLORIDE 0.9 % IR SOLN
Status: DC | PRN
  Administered 2018-08-26 (×2): 1000 mL

## 2018-08-26 MED ORDER — KETOROLAC TROMETHAMINE 30 MG/ML IJ SOLN
INTRAMUSCULAR | Status: DC | PRN
  Administered 2018-08-26: 30 mg

## 2018-08-26 MED ORDER — SODIUM CHLORIDE (PF) 0.9 % IJ SOLN
INTRAMUSCULAR | Status: DC | PRN
  Administered 2018-08-26: 30 mL

## 2018-08-26 MED ORDER — HYDROCODONE-ACETAMINOPHEN 7.5-325 MG PO TABS: 1.0000 | ORAL_TABLET | ORAL | 0 refills | Status: DC | PRN

## 2018-08-26 MED ORDER — FENTANYL CITRATE (PF) 100 MCG/2ML IJ SOLN
INTRAMUSCULAR | Status: AC
  Filled 2018-08-26: qty 2

## 2018-08-26 MED ORDER — DIPHENHYDRAMINE HCL 12.5 MG/5ML PO ELIX: 12.5000 mg | ORAL_SOLUTION | ORAL | Status: DC | PRN

## 2018-08-26 MED ORDER — CEFAZOLIN SODIUM-DEXTROSE 2-4 GM/100ML-% IV SOLN
2.0000 g | Freq: Four times a day (QID) | INTRAVENOUS | Status: AC
  Administered 2018-08-26 (×2): 2 g via INTRAVENOUS
  Filled 2018-08-26 (×2): qty 100

## 2018-08-26 MED ORDER — ALUM & MAG HYDROXIDE-SIMETH 200-200-20 MG/5ML PO SUSP: 15.0000 mL | ORAL | Status: DC | PRN

## 2018-08-26 MED ORDER — FERROUS SULFATE 325 (65 FE) MG PO TABS: 325.0000 mg | ORAL_TABLET | Freq: Three times a day (TID) | ORAL | 3 refills | Status: DC

## 2018-08-26 MED ORDER — DOCUSATE SODIUM 100 MG PO CAPS
100.0000 mg | ORAL_CAPSULE | Freq: Two times a day (BID) | ORAL | Status: DC
  Administered 2018-08-26 – 2018-08-27 (×2): 100 mg via ORAL
  Filled 2018-08-26 (×2): qty 1

## 2018-08-26 MED ORDER — PROPOFOL 10 MG/ML IV BOLUS
INTRAVENOUS | Status: AC
  Filled 2018-08-26: qty 60

## 2018-08-26 MED ORDER — PROPOFOL 10 MG/ML IV BOLUS
INTRAVENOUS | Status: AC
  Filled 2018-08-26: qty 20

## 2018-08-26 MED ORDER — LACTATED RINGERS IV SOLN
INTRAVENOUS | Status: DC
  Administered 2018-08-26 (×3): via INTRAVENOUS

## 2018-08-26 MED ORDER — FENTANYL CITRATE (PF) 100 MCG/2ML IJ SOLN: 25.0000 ug | INTRAMUSCULAR | Status: DC | PRN

## 2018-08-26 MED ORDER — MIDAZOLAM HCL 2 MG/2ML IJ SOLN
1.0000 mg | Freq: Once | INTRAMUSCULAR | Status: DC
  Filled 2018-08-26: qty 2

## 2018-08-26 MED ORDER — METHOCARBAMOL 500 MG IVPB - SIMPLE MED
500.0000 mg | Freq: Four times a day (QID) | INTRAVENOUS | Status: DC | PRN
  Filled 2018-08-26: qty 50

## 2018-08-26 MED ORDER — ROPIVACAINE HCL 7.5 MG/ML IJ SOLN
INTRAMUSCULAR | Status: DC | PRN
  Administered 2018-08-26: 20 mL via PERINEURAL

## 2018-08-26 MED ORDER — FENTANYL CITRATE (PF) 100 MCG/2ML IJ SOLN
INTRAMUSCULAR | Status: DC | PRN
  Administered 2018-08-26: 50 ug via INTRAVENOUS

## 2018-08-26 MED ORDER — DEXAMETHASONE SODIUM PHOSPHATE 10 MG/ML IJ SOLN
INTRAMUSCULAR | Status: DC | PRN
  Administered 2018-08-26: 8 mg via INTRAVENOUS

## 2018-08-26 MED ORDER — DEXAMETHASONE SODIUM PHOSPHATE 10 MG/ML IJ SOLN
10.0000 mg | Freq: Once | INTRAMUSCULAR | Status: AC
  Administered 2018-08-27: 11:00:00 10 mg via INTRAVENOUS
  Filled 2018-08-26: qty 1

## 2018-08-26 MED ORDER — FENTANYL CITRATE (PF) 100 MCG/2ML IJ SOLN
50.0000 ug | Freq: Once | INTRAMUSCULAR | Status: AC
  Administered 2018-08-26: 50 ug via INTRAVENOUS
  Filled 2018-08-26: qty 2

## 2018-08-26 MED ORDER — METOCLOPRAMIDE HCL 5 MG PO TABS: 5.0000 mg | ORAL_TABLET | Freq: Three times a day (TID) | ORAL | Status: DC | PRN

## 2018-08-26 MED ORDER — DEXAMETHASONE SODIUM PHOSPHATE 10 MG/ML IJ SOLN
10.0000 mg | Freq: Once | INTRAMUSCULAR | Status: AC
  Administered 2018-08-26: 8 mg via INTRAVENOUS

## 2018-08-26 MED ORDER — PHENOL 1.4 % MT LIQD: 1.0000 | OROMUCOSAL | Status: DC | PRN

## 2018-08-26 MED ORDER — DM-GUAIFENESIN ER 30-600 MG PO TB12
1.0000 | ORAL_TABLET | Freq: Every morning | ORAL | Status: DC
  Administered 2018-08-27: 11:00:00 1 via ORAL
  Filled 2018-08-26: qty 1

## 2018-08-26 MED ORDER — ONDANSETRON HCL 4 MG/2ML IJ SOLN
INTRAMUSCULAR | Status: DC | PRN
  Administered 2018-08-26: 4 mg via INTRAVENOUS

## 2018-08-26 MED ORDER — METHOCARBAMOL 500 MG PO TABS: 500.0000 mg | ORAL_TABLET | Freq: Four times a day (QID) | ORAL | 0 refills | Status: DC | PRN

## 2018-08-26 MED ORDER — DOCUSATE SODIUM 100 MG PO CAPS: 100.0000 mg | ORAL_CAPSULE | Freq: Two times a day (BID) | ORAL | 0 refills | Status: DC

## 2018-08-26 MED ORDER — CEFAZOLIN SODIUM-DEXTROSE 2-4 GM/100ML-% IV SOLN
2.0000 g | INTRAVENOUS | Status: AC
  Administered 2018-08-26: 2 g via INTRAVENOUS
  Filled 2018-08-26: qty 100

## 2018-08-26 MED ORDER — METOCLOPRAMIDE HCL 5 MG/ML IJ SOLN: 5.0000 mg | Freq: Three times a day (TID) | INTRAMUSCULAR | Status: DC | PRN

## 2018-08-26 MED ORDER — SODIUM CHLORIDE (PF) 0.9 % IJ SOLN
INTRAMUSCULAR | Status: AC
  Filled 2018-08-26: qty 50

## 2018-08-26 MED ORDER — PROPOFOL 500 MG/50ML IV EMUL
INTRAVENOUS | Status: DC | PRN
  Administered 2018-08-26: 75 ug/kg/min via INTRAVENOUS

## 2018-08-26 MED ORDER — MAGNESIUM CITRATE PO SOLN: 1.0000 | Freq: Once | ORAL | Status: DC | PRN

## 2018-08-26 MED ORDER — ACETAMINOPHEN 325 MG PO TABS
325.0000 mg | ORAL_TABLET | Freq: Four times a day (QID) | ORAL | Status: DC | PRN
  Filled 2018-08-26: qty 2

## 2018-08-26 MED ORDER — TRANEXAMIC ACID-NACL 1000-0.7 MG/100ML-% IV SOLN
1000.0000 mg | INTRAVENOUS | Status: AC
  Administered 2018-08-26: 1000 mg via INTRAVENOUS
  Filled 2018-08-26: qty 100

## 2018-08-26 MED ORDER — VANCOMYCIN HCL 1000 MG IV SOLR
INTRAVENOUS | Status: AC
  Filled 2018-08-26: qty 1000

## 2018-08-26 MED ORDER — LIDOCAINE 2% (20 MG/ML) 5 ML SYRINGE
INTRAMUSCULAR | Status: AC
  Filled 2018-08-26: qty 5

## 2018-08-26 MED ORDER — MENTHOL 3 MG MT LOZG: 1.0000 | LOZENGE | OROMUCOSAL | Status: DC | PRN

## 2018-08-26 MED ORDER — FERROUS SULFATE 325 (65 FE) MG PO TABS
325.0000 mg | ORAL_TABLET | Freq: Two times a day (BID) | ORAL | Status: DC
  Administered 2018-08-27: 325 mg via ORAL
  Filled 2018-08-26: qty 1

## 2018-08-26 MED ORDER — BUPIVACAINE IN DEXTROSE 0.75-8.25 % IT SOLN
INTRATHECAL | Status: DC | PRN
  Administered 2018-08-26: 1.6 mL via INTRATHECAL

## 2018-08-26 MED ORDER — TRANEXAMIC ACID-NACL 1000-0.7 MG/100ML-% IV SOLN
1000.0000 mg | Freq: Once | INTRAVENOUS | Status: AC
  Administered 2018-08-26: 1000 mg via INTRAVENOUS
  Filled 2018-08-26: qty 100

## 2018-08-26 MED ORDER — POVIDONE-IODINE 10 % EX SWAB
2.0000 "application " | Freq: Once | CUTANEOUS | Status: AC
  Administered 2018-08-26: 2 via TOPICAL

## 2018-08-26 MED ORDER — HYDROCODONE-ACETAMINOPHEN 5-325 MG PO TABS: 1.0000 | ORAL_TABLET | ORAL | Status: DC | PRN

## 2018-08-26 MED ORDER — POLYETHYLENE GLYCOL 3350 17 G PO PACK: 17.0000 g | PACK | Freq: Two times a day (BID) | ORAL | 0 refills | Status: DC

## 2018-08-26 MED ORDER — HYDROCODONE-ACETAMINOPHEN 7.5-325 MG PO TABS
1.0000 | ORAL_TABLET | ORAL | Status: DC | PRN
  Administered 2018-08-26 – 2018-08-27 (×2): 2 via ORAL
  Filled 2018-08-26 (×2): qty 2

## 2018-08-26 SURGICAL SUPPLY — 64 items
ATTUNE MED ANAT PAT 35 KNEE (Knees) ×2 IMPLANT
ATTUNE MED ANAT PAT 35MM KNEE (Knees) ×1 IMPLANT
ATTUNE PSFEM RTSZ6 NARCEM KNEE (Femur) ×3 IMPLANT
ATTUNE PSRP INSR SZ6 6 KNEE (Insert) ×2 IMPLANT
ATTUNE PSRP INSR SZ6 6MM KNEE (Insert) ×1 IMPLANT
BAG ZIPLOCK 12X15 (MISCELLANEOUS) IMPLANT
BANDAGE ACE 6X5 VEL STRL LF (GAUZE/BANDAGES/DRESSINGS) ×3 IMPLANT
BASE TIBIA ATTUNE KNEE SYS SZ6 (Knees) ×1 IMPLANT
BLADE SAW SGTL 11.0X1.19X90.0M (BLADE) IMPLANT
BLADE SAW SGTL 13.0X1.19X90.0M (BLADE) ×3 IMPLANT
BLADE SURG SZ10 CARB STEEL (BLADE) ×6 IMPLANT
BOWL SMART MIX CTS (DISPOSABLE) ×3 IMPLANT
CEMENT HV SMART SET (Cement) ×6 IMPLANT
COVER SURGICAL LIGHT HANDLE (MISCELLANEOUS) ×3 IMPLANT
COVER WAND RF STERILE (DRAPES) IMPLANT
CUFF TOURN SGL QUICK 34 (TOURNIQUET CUFF) ×2
CUFF TRNQT CYL 34X4.125X (TOURNIQUET CUFF) ×1 IMPLANT
DECANTER SPIKE VIAL GLASS SM (MISCELLANEOUS) ×6 IMPLANT
DERMABOND ADVANCED (GAUZE/BANDAGES/DRESSINGS) ×2
DERMABOND ADVANCED .7 DNX12 (GAUZE/BANDAGES/DRESSINGS) ×1 IMPLANT
DRAPE INCISE IOBAN 66X45 STRL (DRAPES) ×3 IMPLANT
DRAPE U-SHAPE 47X51 STRL (DRAPES) ×3 IMPLANT
DRESSING AQUACEL AG SP 3.5X10 (GAUZE/BANDAGES/DRESSINGS) ×1 IMPLANT
DRSG AQUACEL AG SP 3.5X10 (GAUZE/BANDAGES/DRESSINGS) ×3
DRSG TEGADERM 4X4.75 (GAUZE/BANDAGES/DRESSINGS) ×3 IMPLANT
DURAPREP 26ML APPLICATOR (WOUND CARE) ×6 IMPLANT
ELECT REM PT RETURN 15FT ADLT (MISCELLANEOUS) ×3 IMPLANT
FACESHIELD WRAPAROUND (MASK) ×15 IMPLANT
GLOVE BIO SURGEON STRL SZ 6 (GLOVE) ×3 IMPLANT
GLOVE BIOGEL PI IND STRL 6.5 (GLOVE) ×1 IMPLANT
GLOVE BIOGEL PI IND STRL 7.5 (GLOVE) ×1 IMPLANT
GLOVE BIOGEL PI IND STRL 8.5 (GLOVE) ×1 IMPLANT
GLOVE BIOGEL PI INDICATOR 6.5 (GLOVE) ×2
GLOVE BIOGEL PI INDICATOR 7.5 (GLOVE) ×2
GLOVE BIOGEL PI INDICATOR 8.5 (GLOVE) ×2
GLOVE ECLIPSE 8.0 STRL XLNG CF (GLOVE) ×3 IMPLANT
GLOVE ORTHO TXT STRL SZ7.5 (GLOVE) ×3 IMPLANT
GOWN STRL REUS W/ TWL LRG LVL3 (GOWN DISPOSABLE) ×1 IMPLANT
GOWN STRL REUS W/TWL 2XL LVL3 (GOWN DISPOSABLE) ×3 IMPLANT
GOWN STRL REUS W/TWL LRG LVL3 (GOWN DISPOSABLE) ×5 IMPLANT
HANDPIECE INTERPULSE COAX TIP (DISPOSABLE) ×2
HOLDER FOLEY CATH W/STRAP (MISCELLANEOUS) ×3 IMPLANT
KIT TURNOVER KIT A (KITS) IMPLANT
MANIFOLD NEPTUNE II (INSTRUMENTS) ×3 IMPLANT
NDL SAFETY ECLIPSE 18X1.5 (NEEDLE) ×1 IMPLANT
NEEDLE HYPO 18GX1.5 SHARP (NEEDLE) ×2
NS IRRIG 1000ML POUR BTL (IV SOLUTION) ×3 IMPLANT
PACK TOTAL KNEE CUSTOM (KITS) ×3 IMPLANT
PIN FIX SIGMA HP QUICK REL (PIN) ×3 IMPLANT
PIN THREADED HEADED SIGMA (PIN) ×3 IMPLANT
PROTECTOR NERVE ULNAR (MISCELLANEOUS) ×3 IMPLANT
SET HNDPC FAN SPRY TIP SCT (DISPOSABLE) ×1 IMPLANT
SET PAD KNEE POSITIONER (MISCELLANEOUS) ×3 IMPLANT
SUT MNCRL AB 4-0 PS2 18 (SUTURE) ×3 IMPLANT
SUT STRATAFIX PDS+ 0 24IN (SUTURE) ×3 IMPLANT
SUT VIC AB 1 CT1 36 (SUTURE) ×3 IMPLANT
SUT VIC AB 2-0 CT1 27 (SUTURE) ×6
SUT VIC AB 2-0 CT1 TAPERPNT 27 (SUTURE) ×3 IMPLANT
SYR 3ML LL SCALE MARK (SYRINGE) ×6 IMPLANT
TIBIA ATTUNE KNEE SYS BASE SZ6 (Knees) ×3 IMPLANT
TRAY FOLEY MTR SLVR 16FR STAT (SET/KITS/TRAYS/PACK) ×3 IMPLANT
WATER STERILE IRR 1000ML POUR (IV SOLUTION) ×6 IMPLANT
WRAP KNEE MAXI GEL POST OP (GAUZE/BANDAGES/DRESSINGS) ×3 IMPLANT
YANKAUER SUCT BULB TIP 10FT TU (MISCELLANEOUS) ×3 IMPLANT

## 2018-08-26 NOTE — Op Note (Signed)
NAME:  Glenn Hunt.                      MEDICAL RECORD NO.:  [id number]                             FACILITY:  [facility]      PHYSICIAN:  Pietro Cassis. Alvan Dame, M.D.  DATE OF BIRTH:  Jan 18, 1932      DATE OF PROCEDURE:  08/26/2018                                     OPERATIVE REPORT         PREOPERATIVE DIAGNOSIS:  Right knee osteoarthritis.      POSTOPERATIVE DIAGNOSIS:  Right knee osteoarthritis.      FINDINGS:  The patient was noted to have complete loss of cartilage and   bone-on-bone arthritis with associated osteophytes in the lateral and patellofemoral compartments of   the knee with a significant synovitis and associated effusion.  The patient had failed months of conservative treatment including medications, injection therapy, activity modification.     PROCEDURE:  Right total knee replacement.      COMPONENTS USED:  DePuy Attune rotating platform posterior stabilized knee   system, a size 6N femur, 6 tibia, size 6 mm PS AOX insert, and 35 anatomic patellar   button.      SURGEON:  Pietro Cassis. Alvan Dame, M.D.      ASSISTANT:  Griffith Citron, PA-C.      ANESTHESIA:  Regional and Spinal.      SPECIMENS:  None.      COMPLICATION:  None.      DRAINS:  None.  EBL: <100cc      TOURNIQUET TIME:   Total Tourniquet Time Documented: Thigh (Right) - 31 minutes Total: Thigh (Right) - 31 minutes  .      The patient was stable to the recovery room.      INDICATION FOR PROCEDURE:  Glenn Hunt. is a 85 y.o. male patient of   mine.  The patient had been seen, evaluated, and treated for months conservatively in the   office with medication, activity modification, and injections.  The patient had   radiographic changes of bone-on-bone arthritis with endplate sclerosis and osteophytes noted.  Based on the radiographic changes and failed conservative measures, the patient   decided to proceed with definitive treatment, total knee replacement.  Risks of infection, DVT,  component failure, need for revision surgery, neurovascular injury were reviewed in the office setting.  The postop course was reviewed stressing the efforts to maximize post-operative satisfaction and function.  Consent was obtained for benefit of pain   relief.      PROCEDURE IN DETAIL:  The patient was brought to the operative theater.   Once adequate anesthesia, preoperative antibiotics, 2 gm of Ancef,1 gm of Tranexamic Acid, and 10 mg of Decadron administered, the patient was positioned supine with a right thigh tourniquet placed.  The  right lower extremity was prepped and draped in sterile fashion.  A time-   out was performed identifying the patient, planned procedure, and the appropriate extremity.      The right lower extremity was placed in the [facility] leg holder.  The leg was   exsanguinated, tourniquet elevated to 250 mmHg.  A midline incision was  made followed by median parapatellar arthrotomy.  Following initial   exposure, attention was first directed to the patella.  Precut   measurement was noted to be 24 mm.  I resected down to 14 mm and used a   35 anatomic patellar button to restore patellar height as well as cover the cut surface.      The lug holes were drilled and a metal shim was placed to protect the   patella from retractors and saw blade during the procedure.      At this point, attention was now directed to the femur.  The femoral   canal was opened with a drill, irrigated to try to prevent fat emboli.  An   intramedullary rod was passed at 3 degrees valgus, 9 mm of bone was   resected off the distal femur.  Following this resection, the tibia was   subluxated anteriorly.  Using the extramedullary guide, 4 mm of bone was resected off   the proximal lateral tibia.  We confirmed the gap would be   stable medially and laterally with a size 5 spacer block as well as confirmed that the tibial cut was perpendicular in the coronal plane, checking with an alignment rod.       Once this was done, I sized the femur to be a size 6 in the anterior-   posterior dimension, chose a narrow component based on medial and   lateral dimension.  The size 6 rotation block was then pinned in   position anterior referenced using the C-clamp to set rotation.  The   anterior, posterior, and  chamfer cuts were made without difficulty nor   notching making certain that I was along the anterior cortex to help   with flexion gap stability.      The final box cut was made off the lateral aspect of distal femur.      At this point, the tibia was sized to be a size 6.  The size 6 tray was   then pinned in position through the medial third of the tubercle,   drilled, and keel punched.  Trial reduction was now carried with a 6 femur,  6 tibia, a size 6 mm PS insert, and the 38 anatomic patella botton.  The knee was brought to full extension with good flexion stability with the patella   tracking through the trochlea without application of pressure.  Given   all these findings the trial components removed.  Final components were   opened and cement was mixed.  The knee was irrigated with normal saline solution and pulse lavage.  The synovial lining was   then injected with 30 cc of 0.25% Marcaine with epinephrine, 1 cc of Toradol and 30 cc of NS for a total of 61 cc.     Final implants were then cemented onto cleaned and dried cut surfaces of bone with the knee brought to extension with a size 6 mm PS trial insert.      Once the cement had fully cured, excess cement was removed   throughout the knee.  I confirmed that I was satisfied with the range of   motion and stability, and the final size 6 mm PS AOX insert was chosen.  It was   placed into the knee.      The tourniquet had been let down at 31 minutes.  No significant   hemostasis was required.  The extensor mechanism was then reapproximated using #1 Vicryl  and #1 Stratafix sutures with the knee   in flexion.  The    remaining wound was closed with 2-0 Vicryl and running 4-0 Monocryl.   The knee was cleaned, dried, dressed sterilely using Dermabond and   Aquacel dressing.  The patient was then   brought to recovery room in stable condition, tolerating the procedure   well.   Please note that Physician Assistant, Griffith Citron, PA-C was present for the entirety of the case, and was utilized for pre-operative positioning, peri-operative retractor management, general facilitation of the procedure and for primary wound closure at the end of the case.              Pietro Cassis Alvan Dame, M.D.    08/26/2018 2:01 PM

## 2018-08-26 NOTE — Transfer of Care (Signed)
Immediate Anesthesia Transfer of Care Note  Patient: Glenn Hunt.  Procedure(s) Performed: TOTAL KNEE ARTHROPLASTY (Right )  Patient Location: PACU  Anesthesia Type:Spinal  Level of Consciousness: awake, alert , oriented and patient cooperative  Airway & Oxygen Therapy: Patient Spontanous Breathing and Patient connected to face mask oxygen  Post-op Assessment: Report given to RN and Post -op Vital signs reviewed and stable  Post vital signs: stable  Last Vitals:  Vitals Value Taken Time  BP 141/59 08/26/2018  2:34 PM  Temp 36.6 C 08/26/2018  2:34 PM  Pulse 70 08/26/2018  2:37 PM  Resp 22 08/26/2018  2:37 PM  SpO2 96 % 08/26/2018  2:37 PM  Vitals shown include unvalidated device data.  Last Pain:  Vitals:   08/26/18 1216  TempSrc:   PainSc: 0-No pain         Complications: No apparent anesthesia complications

## 2018-08-26 NOTE — Anesthesia Procedure Notes (Signed)
Anesthesia Regional Block: Adductor canal block   Pre-Anesthetic Checklist: ,, timeout performed, Correct Patient, Correct Site, Correct Laterality, Correct Procedure, Correct Position, site marked, Risks and benefits discussed,  Surgical consent,  Pre-op evaluation,  At surgeon's request and post-op pain management  Laterality: Right and Lower  Prep: chloraprep       Needles:  Injection technique: Single-shot  Needle Type: Echogenic Needle     Needle Length: 9cm  Needle Gauge: 21     Additional Needles:   Procedures:,,,, ultrasound used (permanent image in chart),,,,  Narrative:  Start time: 08/26/2018 11:54 AM End time: 08/26/2018 12:01 PM Injection made incrementally with aspirations every 5 mL.  Performed by: Personally  Anesthesiologist: Annye Asa, MD  Additional Notes: Pt identified in Holding room.  Monitors applied. Working IV access confirmed. Sterile prep, drape R thigh.  #21ga ECHOgenic needle into adductor canal with US guidance.  20cc 0.75% Ropivacaine injected incrementally after negative test dose.  Patient asymptomatic, VSS, no heme aspirated, tolerated well.  Jenita Seashore, MD

## 2018-08-26 NOTE — Anesthesia Postprocedure Evaluation (Signed)
Anesthesia Post Note  Patient: Glenn Hunt.  Procedure(s) Performed: TOTAL KNEE ARTHROPLASTY (Right )     Patient location during evaluation: PACU Anesthesia Type: Spinal Level of consciousness: patient cooperative, awake and alert and oriented Pain management: pain level controlled Vital Signs Assessment: post-procedure vital signs reviewed and stable Respiratory status: spontaneous breathing, nonlabored ventilation, respiratory function stable and patient connected to nasal cannula oxygen Cardiovascular status: blood pressure returned to baseline and stable Postop Assessment: patient able to bend at knees, no apparent nausea or vomiting and spinal receding Anesthetic complications: no    Last Vitals:  Vitals:   08/26/18 1434 08/26/18 1445  BP: (!) 141/59 (!) 142/65  Pulse: 66 64  Resp: 13 15  Temp: 36.6 C   SpO2: 94% 98%    Last Pain:  Vitals:   08/26/18 1434  TempSrc:   PainSc: 0-No pain                 Lemoine Goyne,E. Latiana Tomei

## 2018-08-26 NOTE — Anesthesia Procedure Notes (Signed)
Spinal  Patient location during procedure: OR End time: 08/26/2018 12:43 PM Staffing Anesthesiologist: Annye Asa, MD Performed: anesthesiologist  Preanesthetic Checklist Completed: patient identified, site marked, surgical consent, pre-op evaluation, timeout performed, IV checked, risks and benefits discussed and monitors and equipment checked Spinal Block Patient position: sitting Prep: site prepped and draped and DuraPrep Patient monitoring: blood pressure, continuous pulse ox, cardiac monitor and heart rate Approach: midline Location: L3-4 Injection technique: single-shot Needle Needle type: Pencan  Needle gauge: 24 G Needle length: 9 cm Additional Notes Pt identified in Operating room.  Monitors applied. Working IV access confirmed. Sterile prep, drape lumbar spine.  1% lido local L 3,4.  #24ga Pencan into clear CSF L 3,4.  12mg  0.75% Bupivacaine with dextrose injected with asp CSF beginning and end of injection.  Patient asymptomatic, VSS, no heme aspirated, tolerated well.  Jenita Seashore, MD

## 2018-08-26 NOTE — Anesthesia Procedure Notes (Signed)
Procedure Name: MAC Date/Time: 08/26/2018 12:36 PM Performed by: Lissa Morales, CRNA Pre-anesthesia Checklist: Patient identified, Emergency Drugs available, Suction available, Patient being monitored and Timeout performed Patient Re-evaluated:Patient Re-evaluated prior to induction Oxygen Delivery Method: Simple face mask Placement Confirmation: positive ETCO2

## 2018-08-26 NOTE — Interval H&P Note (Signed)
History and Physical Interval Note:  08/26/2018 11:22 AM  Glenn Hunt.  has presented today for surgery, with the diagnosis of Right knee osteoarthritis.  The various methods of treatment have been discussed with the patient and family. After consideration of risks, benefits and other options for treatment, the patient has consented to  Procedure(s) with comments: TOTAL KNEE ARTHROPLASTY (Right) - 70 mins as a surgical intervention.  The patient's history has been reviewed, patient examined, no change in status, stable for surgery.  I have reviewed the patient's chart and labs.  Questions were answered to the patient's satisfaction.     Mauri Pole

## 2018-08-26 NOTE — Evaluation (Signed)
Physical Therapy Evaluation Patient Details Name: Glenn Hunt. MRN: [id number] DOB: 30-Nov-1931 Today's Date: 08/26/2018   History of Present Illness  85 yo male s/p R TKR on 08/26/18. PMH includes CAD s/p stenting, OA, renal cancer s/p R nephrectomy, skin cancer, cataracts.   Clinical Impression   Pt presents with R knee pain, decreased R knee ROM, difficulty performing bed mobility, increased time and effort otp perform mobility tasks, and decreased activity tolerance due to pain. Pt to benefit from acute PT to address deficits. Pt ambulated 4 ft with RW with min guard assist, verbal cuing for safety and form provided throughout. Pt educated on ankle pumps (20/hour) to perform this afternoon/evening to increase circulation, to pt's tolerance and limited by pain. PT to progress mobility as tolerated, and will continue to follow acutely.        Follow Up Recommendations Follow surgeon's recommendation for DC plan and follow-up therapies;Supervision for mobility/OOB(OPPT)    Equipment Recommendations  None recommended by PT    Recommendations for Other Services       Precautions / Restrictions Precautions Precautions: Fall Restrictions Weight Bearing Restrictions: No Other Position/Activity Restrictions: WBAT      Mobility  Bed Mobility Overal bed mobility: Needs Assistance Bed Mobility: Supine to Sit     Supine to sit: Min assist;HOB elevated     General bed mobility comments: Min assist for RLE management, scooting to EOB. Increased time and use of bed rails.   Transfers Overall transfer level: Needs assistance Equipment used: Rolling walker (2 wheeled) Transfers: Sit to/from Stand Sit to Stand: Min guard;From elevated surface         General transfer comment: Min guard for safety, steady assist in standing. Verbal cuing for hand placement when rising.   Ambulation/Gait Ambulation/Gait assistance: Min guard Gait Distance (Feet): 45 Feet Assistive  device: Rolling walker (2 wheeled) Gait Pattern/deviations: Step-to pattern;Decreased stride length;Antalgic;Trunk flexed Gait velocity: decr   General Gait Details: min guard for safety, verbal cuing for upright posture and looking forward as opposed to downward, sequencing, turning with RW, and placement in RW. No R knee instability noted.   Stairs            Wheelchair Mobility    Modified Rankin (Stroke Patients Only)       Balance Overall balance assessment: Mild deficits observed, not formally tested                                           Pertinent Vitals/Pain Pain Assessment: 0-10 Pain Score: 8  Pain Location: R knee  Pain Descriptors / Indicators: Aching;Throbbing Pain Intervention(s): Limited activity within patient's tolerance;Monitored during session;Repositioned;Premedicated before session    [place] expects to be discharged to:: Private residence Living Arrangements: Spouse/significant other Available Help at Discharge: Family Type of Home: House(condo) Home Access: Stairs to enter Entrance Stairs-Rails: Left Entrance Stairs-Number of Steps: 3 Home Layout: Two level;Able to live on main level with bedroom/bathroom Home Equipment: Gilford Rile - 2 wheels;Cane - single point      Prior Function Level of Independence: Independent with assistive device(s)         Comments: Pt reports using cane and RW intermittently as needed. Pt is a retired [employer] account executive.      Hand Dominance   Dominant Hand: Right    Extremity/Trunk Assessment   Upper Extremity Assessment Upper Extremity Assessment:  Overall WFL for tasks assessed    Lower Extremity Assessment Lower Extremity Assessment: Overall WFL for tasks assessed;RLE deficits/detail RLE Deficits / Details: suspected post-surgical weakness; able to perform ankle pumps, quad set, assisted heel slides to 45* limited by pain, SLR without lift assist or quad  lag RLE Sensation: WNL    Cervical / Trunk Assessment Cervical / Trunk Assessment: Normal  Communication   Communication: No difficulties  Cognition Arousal/Alertness: Awake/alert Behavior During Therapy: WFL for tasks assessed/performed Overall Cognitive Status: Within Functional Limits for tasks assessed                                        General Comments      Exercises     Assessment/Plan    PT Assessment Patient needs continued PT services  PT Problem List Decreased strength;Decreased mobility;Decreased safety awareness;Decreased range of motion;Decreased activity tolerance;Decreased balance;Decreased knowledge of use of DME;Pain       PT Treatment Interventions DME instruction;Functional mobility training;Balance training;Patient/family education;Gait training;Therapeutic activities;Stair training;Therapeutic exercise    PT Goals (Current goals can be found in the Care Plan section)  Acute Rehab PT Goals Patient Stated Goal: decrease R knee pain with mobility  PT Goal Formulation: With patient Time For Goal Achievement: 09/02/18 Potential to Achieve Goals: Good    Frequency 7X/week   Barriers to discharge        Co-evaluation               AM-PAC PT "6 Clicks" Mobility  Outcome Measure Help needed turning from your back to your side while in a flat bed without using bedrails?: A Little Help needed moving from lying on your back to sitting on the side of a flat bed without using bedrails?: A Lot Help needed moving to and from a bed to a chair (including a wheelchair)?: A Little Help needed standing up from a chair using your arms (e.g., wheelchair or bedside chair)?: A Little Help needed to walk in hospital room?: A Little Help needed climbing 3-5 steps with a railing? : A Lot 6 Click Score: 16    End of Session Equipment Utilized During Treatment: Gait belt Activity Tolerance: Patient tolerated treatment well;Patient limited by  pain Patient left: with chair alarm set;with call bell/phone within reach;in chair;with SCD's reapplied Nurse Communication: Mobility status PT Visit Diagnosis: Other abnormalities of gait and mobility (R26.89);Difficulty in walking, not elsewhere classified (R26.2)    Time: 1810-1840 PT Time Calculation (min) (ACUTE ONLY): 30 min   Charges:   PT Evaluation $PT Eval Low Complexity: 1 Low PT Treatments $Gait Training: 8-22 mins        Julien Girt, PT Acute Rehabilitation Services Pager [phone]  Office [phone]   Roxine Caddy D Elonda Husky 08/26/2018, 7:31 PM

## 2018-08-26 NOTE — Plan of Care (Signed)

## 2018-08-26 NOTE — Discharge Instructions (Signed)

## 2018-08-26 NOTE — Progress Notes (Signed)
AssistedDr. Carswell Jackson with right, ultrasound guided, adductor canal block. Side rails up, monitors on throughout procedure. See vital signs in flow sheet. Tolerated Procedure well.  

## 2018-08-27 ENCOUNTER — Encounter (HOSPITAL_COMMUNITY): Payer: Self-pay | Admitting: Orthopedic Surgery

## 2018-08-27 DIAGNOSIS — M1711 Unilateral primary osteoarthritis, right knee: Secondary | ICD-10-CM | POA: Diagnosis not present

## 2018-08-27 LAB — CBC
HCT: 35.3 % — ABNORMAL LOW (ref 39.0–52.0)
Hemoglobin: 11.8 g/dL — ABNORMAL LOW (ref 13.0–17.0)
MCH: 36.1 pg — ABNORMAL HIGH (ref 26.0–34.0)
MCHC: 33.4 g/dL (ref 30.0–36.0)
MCV: 108 fL — ABNORMAL HIGH (ref 80.0–100.0)
Platelets: 154 10*3/uL (ref 150–400)
RBC: 3.27 MIL/uL — ABNORMAL LOW (ref 4.22–5.81)
RDW: 13.6 % (ref 11.5–15.5)
WBC: 12.6 10*3/uL — ABNORMAL HIGH (ref 4.0–10.5)
nRBC: 0 % (ref 0.0–0.2)

## 2018-08-27 LAB — BASIC METABOLIC PANEL WITH GFR
Anion gap: 7 (ref 5–15)
BUN: 22 mg/dL (ref 8–23)
CO2: 26 mmol/L (ref 22–32)
Calcium: 9.3 mg/dL (ref 8.9–10.3)
Chloride: 106 mmol/L (ref 98–111)
Creatinine, Ser: 1.16 mg/dL (ref 0.61–1.24)
GFR calc Af Amer: >60 mL/min
GFR calc non Af Amer: 58 mL/min — ABNORMAL LOW
Glucose, Bld: 144 mg/dL — ABNORMAL HIGH (ref 70–99)
Potassium: 3.5 mmol/L (ref 3.5–5.1)
Sodium: 139 mmol/L (ref 135–145)

## 2018-08-27 NOTE — Care Management Obs Status (Signed)
[place] NOTIFICATION   Patient Details  Name: Glenn Hunt. MRN: [id number] Date of Birth: 07-18-1931   Medicare Observation Status Notification Given:  Yes    Leeroy Cha, RN 08/27/2018, 9:32 AM

## 2018-08-27 NOTE — Progress Notes (Signed)
Subjective: 1 Day Post-Op Procedure(s) (LRB): TOTAL KNEE ARTHROPLASTY (Right) Patient reports pain as mild.   Patient seen in rounds by Dr. Alvan Dame. Patient is well, and has had no acute complaints or problems other than pain in the right knee. Foley catheter removed, positive flatus.  We will continue therapy today.   Objective: Vital signs in last 24 hours: Temp:  [97.6 F (36.4 C)-98.1 F (36.7 C)] 97.7 F (36.5 C) (05/29 7829) Pulse Rate:  [59-82] 59 (05/29 0638) Resp:  [12-20] 18 (05/29 0638) BP: (141-185)/(42-93) 152/69 (05/29 0638) SpO2:  [94 %-100 %] 98 % (05/29 5621) Weight:  [82.8 kg] 82.8 kg (05/28 1545)  Intake/Output from previous day:  Intake/Output Summary (Last 24 hours) at 08/27/2018 0717 Last data filed at 08/27/2018 0549 Gross per 24 hour  Intake 4293.89 ml  Output 1425 ml  Net 2868.89 ml     Intake/Output this shift: No intake/output data recorded.  Labs: Recent Labs    08/27/18 0343  HGB 11.8*   Recent Labs    08/27/18 0343  WBC 12.6*  RBC 3.27*  HCT 35.3*  PLT 154   Recent Labs    08/27/18 0343  NA 139  K 3.5  CL 106  CO2 26  BUN 22  CREATININE 1.16  GLUCOSE 144*  CALCIUM 9.3   No results for input(s): LABPT, INR in the last 72 hours.  Exam: General - Patient is Alert and Oriented Extremity - Neurologically intact Sensation intact distally Intact pulses distally Dorsiflexion/Plantar flexion intact Dressing - dressing C/D/I Motor Function - intact, moving foot and toes well on exam.   Past Medical History:  Diagnosis Date  . CAD (coronary artery disease)    2007, inferior ischemia on nuclear scan, catheterization showed  60-70% ostial PDA, 90% distal RCA and a very large right coronary artery ( vision stent was placed 3.5 x 23 mm), 80-90% diagonal ( small vessel, wire could not be passed patient followed medically)  . 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 Cancer ([place])    kidney  . Dieulafoy lesion of stomach    incidence of bleeds in 2015 and 2017 , required blood transfusion as bleed lef to a 4.5 hemoglobin  . Gout   . H/O right nephrectomy   . History of blood transfusion   . Skin cancer    skin , squamos cell   . Varicose veins of legs    bialtera; "ive had them about 5 years now and theyve never been a problem"     Assessment/Plan: 1 Day Post-Op Procedure(s) (LRB): TOTAL KNEE ARTHROPLASTY (Right) Principal Problem:   S/P right TKA  Estimated body mass index is 29.46 kg/m as calculated from the following:   Height as of this encounter: 5\' 6"  (1.676 m).   Weight as of this encounter: 82.8 kg. Advance diet Up with therapy D/C IV fluids   Patient's anticipated LOS is less than 2 midnights, meeting these requirements: - Lives within 1 hour of care - Has a competent adult at home to recover with post-op recover - NO history of  - Chronic pain requiring  opiods  - Diabetes  - Coronary Artery Disease  - Heart failure  - Heart attack  - Stroke  - DVT/VTE  - Cardiac arrhythmia  - Respiratory Failure/COPD  - Renal failure  - Anemia  - Advanced Liver disease    DVT Prophylaxis - Aspirin Weight bearing as tolerated. D/C O2 and pulse ox and try on room air.    Plan is to go Home after hospital stay. He is scheduled for outpatient PT. Plan for discharge today after 1-2 sessions of PT as long as he is meeting his goals with therapy. He will follow up in the office in 2 weeks with Dr. Alvan Dame.   Griffith Citron, PA-C Orthopedic Surgery 08/27/2018, 7:17 AM

## 2018-08-27 NOTE — Progress Notes (Signed)
   08/27/18 1153  PT Visit Information  Last PT Received On 08/27/18--pt doing well thi spm; reviewed stairs, gait/safety, verbally reviewed HEP with handout. Pt is ready for d/c from PT standpoint.  Assistance Needed +1  History of Present Illness 85 yo male s/p R TKR on 08/26/18. PMH includes CAD s/p stenting, OA, renal cancer s/p R nephrectomy, skin cancer, cataracts.   Subjective Data  Patient Stated Goal decrease R knee pain with mobility   Precautions  Precautions Fall;Knee  Restrictions  Weight Bearing Restrictions No  Other Position/Activity Restrictions WBAT  Pain Assessment  Pain Assessment 0-10  Pain Score 3  Pain Location R knee   Pain Descriptors / Indicators Aching;Throbbing  Pain Intervention(s) Limited activity within patient's tolerance;Monitored during session;Premedicated before session;Repositioned  Cognition  Arousal/Alertness Awake/alert  Behavior During Therapy WFL for tasks assessed/performed  Overall Cognitive Status Within Functional Limits for tasks assessed  Transfers  Overall transfer level Needs assistance  Equipment used Rolling walker (2 wheeled)  Transfers Sit to/from Stand  Sit to Stand Min guard  General transfer comment cues for hand placement  Ambulation/Gait  Ambulation/Gait assistance Min guard;Supervision  Gait Distance (Feet) 120 Feet  Assistive device Rolling walker (2 wheeled)  Gait Pattern/deviations Step-to pattern;Decreased weight shift to right  General Gait Details cues for sequence and RW position  Stairs Yes  Stairs assistance Min assist  Stair Management One rail Left;Step to pattern;With cane  Number of Stairs 3  General stair comments cues for sequence and safety   PT - End of Session  Equipment Utilized During Treatment Gait belt  Activity Tolerance Patient tolerated treatment well  Patient left in chair;with call bell/phone within reach;with chair alarm set   PT - Assessment/Plan  PT Plan Current plan remains  appropriate  PT Visit Diagnosis Other abnormalities of gait and mobility (R26.89);Difficulty in walking, not elsewhere classified (R26.2)  PT Frequency (ACUTE ONLY) 7X/week  Follow Up Recommendations Follow surgeon's recommendation for DC plan and follow-up therapies;Supervision for mobility/OOB  PT equipment None recommended by PT  AM-PAC PT "6 Clicks" Mobility Outcome Measure (Version 2)  Help needed turning from your back to your side while in a flat bed without using bedrails? 3  Help needed moving from lying on your back to sitting on the side of a flat bed without using bedrails? 3  Help needed moving to and from a bed to a chair (including a wheelchair)? 3  Help needed standing up from a chair using your arms (e.g., wheelchair or bedside chair)? 3  Help needed to walk in hospital room? 3  Help needed climbing 3-5 steps with a railing?  3  6 Click Score 18  Consider Recommendation of Discharge To: Home with [facility]  PT Goal Progression  Progress towards PT goals Progressing toward goals  Acute Rehab PT Goals  PT Goal Formulation With patient  Time For Goal Achievement 09/02/18  Potential to Achieve Goals Good  PT Time Calculation  PT Start Time (ACUTE ONLY) 1132  PT Stop Time (ACUTE ONLY) 1156  PT Time Calculation (min) (ACUTE ONLY) 24 min  PT General Charges  $$ ACUTE PT VISIT 1 Visit  PT Treatments  $Gait Training 23-37 mins

## 2018-08-27 NOTE — Progress Notes (Signed)
Physical Therapy Treatment Patient Details Name: Glenn Hunt. MRN: [id number] DOB: [birth year] Today's Date: 08/27/2018    History of Present Illness 85 yo male s/p R TKR on 08/26/18. PMH includes CAD s/p stenting, OA, renal cancer s/p R nephrectomy, skin cancer, cataracts.     PT Comments    Pt progressing well; will see for a second session for stair training  Follow Up Recommendations  Follow surgeon's recommendation for DC plan and follow-up therapies;Supervision for mobility/OOB     Equipment Recommendations  None recommended by PT    Recommendations for Other Services       Precautions / Restrictions Precautions Precautions: Fall;Knee Restrictions Weight Bearing Restrictions: No Other Position/Activity Restrictions: WBAT    Mobility  Bed Mobility Overal bed mobility: Needs Assistance Bed Mobility: Supine to Sit     Supine to sit: Supervision;Min guard     General bed mobility comments: for safety  Transfers Overall transfer level: Needs assistance Equipment used: Rolling walker (2 wheeled) Transfers: Sit to/from Stand Sit to Stand: Min guard         General transfer comment: cues for hand placement  Ambulation/Gait Ambulation/Gait assistance: Min guard;Supervision Gait Distance (Feet): 80 Feet Assistive device: Rolling walker (2 wheeled) Gait Pattern/deviations: Step-to pattern;Decreased weight shift to right     General Gait Details: cues for sequence and RW position   Stairs             Wheelchair Mobility    Modified Rankin (Stroke Patients Only)       Balance                                            Cognition Arousal/Alertness: Awake/alert Behavior During Therapy: WFL for tasks assessed/performed Overall Cognitive Status: Within Functional Limits for tasks assessed                                        Exercises Total Joint Exercises Ankle Circles/Pumps: AROM;Both;10  reps Quad Sets: AROM;Both;10 reps Heel Slides: 10 reps;AAROM;Right Hip ABduction/ADduction: AROM;Right;10 reps Straight Leg Raises: AROM;Right;10 reps Long Arc Quad: AROM;Right;5 reps;Seated Goniometric ROM: grossly 5* to 80* right knee flexion AAROM     General Comments        Pertinent Vitals/Pain Pain Assessment: 0-10 Pain Score: 2  Pain Location: R knee  Pain Descriptors / Indicators: Aching;Throbbing Pain Intervention(s): Limited activity within patient's tolerance;Monitored during session;Premedicated before session;Repositioned    Home Living                      Prior Function            PT Goals (current goals can now be found in the care plan section) Acute Rehab PT Goals Patient Stated Goal: decrease R knee pain with mobility  PT Goal Formulation: With patient Time For Goal Achievement: 09/02/18 Potential to Achieve Goals: Good Progress towards PT goals: Progressing toward goals    Frequency    7X/week      PT Plan Current plan remains appropriate    Co-evaluation              AM-PAC PT "6 Clicks" Mobility   Outcome Measure  Help needed turning from your back to your side while in a flat bed without using  bedrails?: A Little Help needed moving from lying on your back to sitting on the side of a flat bed without using bedrails?: A Little Help needed moving to and from a bed to a chair (including a wheelchair)?: A Little Help needed standing up from a chair using your arms (e.g., wheelchair or bedside chair)?: A Little Help needed to walk in hospital room?: A Little Help needed climbing 3-5 steps with a railing? : A Little 6 Click Score: 18    End of Session Equipment Utilized During Treatment: Gait belt Activity Tolerance: Patient tolerated treatment well Patient left: in chair;with call bell/phone within reach;with chair alarm set   PT Visit Diagnosis: Other abnormalities of gait and mobility (R26.89);Difficulty in walking, not  elsewhere classified (R26.2)     Time: 4628-6381 PT Time Calculation (min) (ACUTE ONLY): 29 min  Charges:  $Gait Training: 8-22 mins $Therapeutic Exercise: 8-22 mins                     Kenyon Ana, PT  Pager: [phone] Acute Rehab Dept [facility]): [phone]   08/27/2018    [facility] 08/27/2018, 11:28 AM

## 2018-08-30 NOTE — Discharge Summary (Signed)
Physician Discharge Summary   Patient ID: Glenn Hunt. MRN: [id number] DOB/AGE: Nov 17, 1931 85 y.o.  Admit date: 08/26/2018 Discharge date: 08/27/2018  Primary Diagnosis: Right knee osteoarthritis  Admission Diagnoses:  Past Medical History:  Diagnosis Date  . CAD (coronary artery disease)    2007, inferior ischemia on nuclear scan, catheterization showed  60-70% ostial PDA, 90% distal RCA and a very large right coronary artery ( vision stent was placed 3.5 x 23 mm), 80-90% diagonal ( small vessel, wire could not be passed patient followed medically)  . 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 Cancer ([place])    kidney  . Dieulafoy lesion of stomach    incidence of bleeds in 2015 and 2017 , required blood transfusion as bleed lef to a 4.5 hemoglobin  . Gout   . H/O right nephrectomy   . History of blood transfusion   . Skin cancer    skin , squamos cell   . Varicose veins of legs    bialtera; "ive had them about 5 years now and theyve never been a problem"    Discharge Diagnoses:   Principal Problem:   S/P right TKA  Estimated body mass index is 29.46 kg/m as calculated from the following:   Height as of this encounter: 5\' 6"  (1.676 m).   Weight as of this encounter: 82.8 kg.  Procedure:  Procedure(s) (LRB): TOTAL KNEE ARTHROPLASTY (Right)   Consults: None  HPI: Glenn Hunt. is a 85 y.o. male patient of  mine.  The patient had been seen, evaluated, and treated for months conservatively in the  office with medication, activity modification, and injections.  The patient had   radiographic changes of bone-on-bone arthritis with endplate sclerosis and osteophytes  noted.  Based on the radiographic changes and failed conservative measures, the patient  decided to proceed with definitive treatment, total knee replacement.  Risks of infection, DVT, component failure, need for revision surgery, neurovascular injury were reviewed in the office setting.  The postop course was reviewed stressing the efforts to maximize post-operative satisfaction and function.  Consent was obtained for benefit of pain   relief.       Laboratory Data: Admission on 08/26/2018, Discharged on 08/27/2018  Component Date Value Ref Range Status  . WBC 08/27/2018 12.6* 4.0 - 10.5 K/uL Final  . RBC 08/27/2018 3.27* 4.22 - 5.81 MIL/uL Final  . Hemoglobin 08/27/2018 11.8* 13.0 - 17.0 g/dL Final  . HCT 08/27/2018 35.3* 39.0 - 52.0 % Final  . MCV 08/27/2018 108.0* 80.0 - 100.0 fL Final  . MCH 08/27/2018 36.1* 26.0 - 34.0 pg Final  . MCHC 08/27/2018 33.4  30.0 - 36.0 g/dL Final  . RDW 08/27/2018 13.6  11.5 - 15.5 % Final  . Platelets 08/27/2018 154  150 - 400 K/uL Final  . nRBC 08/27/2018 0.0  0.0 - 0.2 % Final   Performed at [facility], [place] [address] [place],  [address]  . Sodium 08/27/2018 139  135 - 145 mmol/L Final  . Potassium 08/27/2018 3.5  3.5 - 5.1 mmol/L Final  . Chloride 08/27/2018  106  98 - 111 mmol/L Final  . CO2 08/27/2018 26  22 - 32 mmol/L Final  . Glucose, Bld 08/27/2018 144* 70 - 99 mg/dL Final  . BUN 08/27/2018 22  8 - 23 mg/dL Final  . Creatinine, Ser 08/27/2018 1.16  0.61 - 1.24 mg/dL Final  . Calcium 08/27/2018 9.3  8.9 - 10.3 mg/dL Final  . GFR calc non Af Amer 08/27/2018 58* >60 mL/min Final  . GFR calc Af Amer 08/27/2018 >60  >60 mL/min Final  . Anion gap 08/27/2018 7  5 - 15 Final   Performed at [facility], [place] [address], [place],  [address]  Hospital Outpatient Visit on 08/24/2018  Component Date Value Ref Range Status  . SARS Coronavirus 2 08/24/2018 NEGATIVE  NEGATIVE Final   Comment:  (NOTE) If result is NEGATIVE SARS-CoV-2 target nucleic acids are NOT DETECTED. The SARS-CoV-2 RNA is generally detectable in upper and lower  respiratory specimens during the acute phase of infection. The lowest  concentration of SARS-CoV-2 viral copies this assay can detect is 250  copies / mL. A negative result does not preclude SARS-CoV-2 infection  and should not be used as the sole basis for treatment or other  patient management decisions.  A negative result may occur with  improper specimen collection / handling, submission of specimen other  than nasopharyngeal swab, presence of viral mutation(s) within the  areas targeted by this assay, and inadequate number of viral copies  (<250 copies / mL). A negative result must be combined with clinical  observations, patient history, and epidemiological information. If result is POSITIVE SARS-CoV-2 target nucleic acids are DETECTED. The SARS-CoV-2 RNA is generally detectable in upper and lower  respiratory specimens dur                          ing the acute phase of infection.  Positive  results are indicative of active infection with SARS-CoV-2.  Clinical  correlation with patient history and other diagnostic information is  necessary to determine patient infection status.  Positive results do  not rule out bacterial infection or co-infection with other viruses. If result is PRESUMPTIVE POSTIVE SARS-CoV-2 nucleic acids MAY BE PRESENT.   A presumptive positive result was obtained on the submitted specimen  and confirmed on repeat testing.  While 2019 novel coronavirus  (SARS-CoV-2) nucleic acids may be present in the submitted sample  additional confirmatory testing may be necessary for epidemiological  and / or clinical management purposes  to differentiate between  SARS-CoV-2 and other Sarbecovirus currently known to infect humans.  If clinically indicated additional testing with an alternate test  methodology [phone]) is advised.  The SARS-CoV-2 RNA is generally  detectable in upper and lower respiratory sp                          ecimens during the acute  phase of infection. The expected result is Negative. Fact Sheet for Patients:  StrictlyIdeas.no Fact Sheet for Healthcare Providers: BankingDealers.co.za This test is not yet approved or cleared by the [place] FDA and has been authorized for detection and/or diagnosis of SARS-CoV-2 by FDA under an Emergency Use Authorization (EUA).  This EUA will remain in effect (meaning this test can be used) for the duration of the COVID-19 declaration under Section 564(b)(1) of the Act, 21 U.S.C. section 360bbb-3(b)(1), unless the authorization is terminated or revoked sooner. Performed at [facility]  [place] [address], [place], [place] [address]   Hospital Outpatient Visit on 08/19/2018  Component Date Value Ref Range Status  . MRSA, PCR 08/19/2018 NEGATIVE  NEGATIVE Final  . Staphylococcus aureus 08/19/2018 NEGATIVE  NEGATIVE Final   Comment: (NOTE) The Xpert SA Assay (FDA approved for NASAL specimens in patients 6 years of age and older), is one component of a comprehensive surveillance program. It is not intended to diagnose infection nor to guide or monitor treatment. Performed at [facility], [place] [address], [place], [place] [address]   . WBC 08/19/2018 6.2  4.0 - 10.5 K/uL Final  . RBC 08/19/2018 4.02* 4.22 - 5.81 MIL/uL Final  . Hemoglobin 08/19/2018 14.1  13.0 - 17.0 g/dL Final  . HCT 08/19/2018 42.5  39.0 - 52.0 % Final  . MCV 08/19/2018 105.7* 80.0 - 100.0 fL Final  . MCH 08/19/2018 35.1* 26.0 - 34.0 pg Final  . MCHC 08/19/2018 33.2  30.0 - 36.0 g/dL Final  . RDW 08/19/2018 14.0  11.5 - 15.5 % Final  . Platelets 08/19/2018 186  150 - 400 K/uL Final  . nRBC 08/19/2018 0.0  0.0 - 0.2 % Final   Performed at [facility], [place] [address],  [place], [place] [address]  . Sodium 08/19/2018 138  135 - 145 mmol/L Final  . Potassium 08/19/2018 4.0  3.5 - 5.1 mmol/L Final  . Chloride 08/19/2018 102  98 - 111 mmol/L Final  . CO2 08/19/2018 27  22 - 32 mmol/L Final  . Glucose, Bld 08/19/2018 114* 70 - 99 mg/dL Final  . BUN 08/19/2018 33* 8 - 23 mg/dL Final  . Creatinine, Ser 08/19/2018 1.26* 0.61 - 1.24 mg/dL Final  . Calcium 08/19/2018 10.2  8.9 - 10.3 mg/dL Final  . GFR calc non Af Amer 08/19/2018 52* >60 mL/min Final  . GFR calc Af Amer 08/19/2018 >60  >60 mL/min Final  . Anion gap 08/19/2018 9  5 - 15 Final   Performed at [facility], [place] [address] [place], [place] [address]  . ABO/RH(D) 08/19/2018 O POS   Final  . Antibody Screen 08/19/2018 NEG   Final  . Sample Expiration 08/19/2018 08/29/2018,2359   Final  . Extend sample reason 08/19/2018    Final                   Value:NO TRANSFUSIONS OR PREGNANCY IN THE PAST 3 MONTHS Performed at [place] [address], [place], [place] [address]   . ABO/RH(D) 08/19/2018    Final                   Value:O POS Performed at [facility], [place] [address], [place], [place] [address]      X-Rays:No results found.  EKG: Orders placed or performed during the hospital encounter of 04/20/12  . ED EKG (<71mins upon arrival to the ED)  . ED EKG (<26mins upon arrival to the ED)  . EKG  . EKG 12-Lead  . EKG 12-Lead  . EKG 12-Lead  . EKG 12-Lead     Hospital Course: Glenn Hunt. is a 85 y.o. who was admitted to [facility]. They were brought to the operating room on 08/26/2018 and underwent Procedure(s): TOTAL KNEE ARTHROPLASTY.  Patient tolerated the procedure well and was later transferred to the recovery room and then to the orthopaedic floor for postoperative care. They were given PO and  IV analgesics for pain control following their surgery. They were given 24 hours of postoperative antibiotics of   Anti-infectives (From admission, onward)   Start     Dose/Rate Route Frequency Ordered Stop   08/26/18 1800  ceFAZolin (ANCEF) IVPB 2g/100 mL premix     2 g 200 mL/hr over 30 Minutes Intravenous Every 6 hours 08/26/18 1547 08/27/18 0039   08/26/18 0915  ceFAZolin (ANCEF) IVPB 2g/100 mL premix     2 g 200 mL/hr over 30 Minutes Intravenous On call to O.R. 08/26/18 6834 08/26/18 1246     and started on DVT prophylaxis in the form of Aspirin.   PT and OT were ordered for total joint protocol. Discharge planning consulted to help with postop disposition and equipment needs.  Patient had a good night on the evening of surgery. They started to get up OOB with therapy on POD #0. Pt was seen during rounds and was ready to go home pending progress with therapy. He worked with therapy on POD #1 and was meeting his goals. Pt was discharged to home later that day in stable condition.  Diet: Renal diet Activity: WBAT Follow-up: in 2 weeks Disposition: Home Discharged Condition: good   Discharge Instructions    Call MD / Call 911   Complete by:  As directed    If you experience chest pain or shortness of breath, CALL 911 and be transported to the hospital emergency room.  If you develope a fever above 101 F, pus (white drainage) or increased drainage or redness at the wound, or calf pain, call your surgeon's office.   Change dressing   Complete by:  As directed    Maintain surgical dressing until follow up in the clinic. If the edges start to pull up, may reinforce with tape. If the dressing is no longer working, may remove and cover with gauze and tape, but must keep the area dry and clean.  Call with any questions or concerns.   Constipation Prevention   Complete by:  As directed    Drink plenty of fluids.  Prune juice may be helpful.  You may use a stool softener, such as Colace (over the counter) 100 mg twice a day.  Use MiraLax (over the counter) for constipation as needed.   Diet - low sodium  heart healthy   Complete by:  As directed    Discharge instructions   Complete by:  As directed    Maintain surgical dressing until follow up in the clinic. If the edges start to pull up, may reinforce with tape. If the dressing is no longer working, may remove and cover with gauze and tape, but must keep the area dry and clean.  Follow up in 2 weeks at [facility]. Call with any questions or concerns.   Increase activity slowly as tolerated   Complete by:  As directed    Weight bearing as tolerated with assist device (walker, cane, etc) as directed, use it as long as suggested by your surgeon or therapist, typically at least 4-6 weeks.   TED hose   Complete by:  As directed    Use stockings (TED hose) for 2 weeks on both leg(s).  You may remove them at night for sleeping.     Allergies as of 08/27/2018      Reactions   Tetracyclines & Related Other (See Comments)   Infection of penis      Medication List    TAKE these medications  allopurinol 300 MG tablet Commonly known as:  ZYLOPRIM Take 300 mg by mouth daily.   aspirin 81 MG chewable tablet Commonly known as:  Aspirin Childrens Chew 1 tablet (81 mg total) by mouth 2 (two) times daily for 30 days. Take for 4 weeks, then resume regular dose.   dextromethorphan-guaiFENesin 30-600 MG 12hr tablet Commonly known as:  MUCINEX DM Take 1 tablet by mouth every morning.   docusate sodium 100 MG capsule Commonly known as:  Colace Take 1 capsule (100 mg total) by mouth 2 (two) times daily.   ferrous sulfate 325 (65 FE) MG tablet Commonly known as:  FerrouSul Take 1 tablet (325 mg total) by mouth 3 (three) times daily with meals.   HYDROcodone-acetaminophen 7.5-325 MG tablet Commonly known as:  Norco Take 1-2 tablets by mouth every 4 (four) hours as needed for moderate pain.   methocarbamol 500 MG tablet Commonly known as:  Robaxin Take 1 tablet (500 mg total) by mouth every 6 (six) hours as needed for muscle  spasms.   MULTIVITAMIN ADULT PO Take 1 tablet by mouth daily.   polyethylene glycol 17 g packet Commonly known as:  MIRALAX / GLYCOLAX Take 17 g by mouth 2 (two) times daily.   torsemide 10 MG tablet Commonly known as:  DEMADEX Take 5 mg by mouth daily as needed (fluid).   triamterene-hydrochlorothiazide 37.5-25 MG tablet Commonly known as:  MAXZIDE-25 Take 1 tablet by mouth daily.            Discharge Care Instructions  (From admission, onward)         Start     Ordered   08/26/18 0000  Change dressing    Comments:  Maintain surgical dressing until follow up in the clinic. If the edges start to pull up, may reinforce with tape. If the dressing is no longer working, may remove and cover with gauze and tape, but must keep the area dry and clean.  Call with any questions or concerns.   08/26/18 2114         Follow-up Information    Paralee Cancel, MD. Schedule an appointment as soon as possible for a visit in 2 weeks.   Specialty:  Orthopedic Surgery Contact information: [address] [place] [address] [phone]           Signed: Griffith Citron, PA-C Orthopedic Surgery 08/30/2018, 9:55 AM

## 2018-09-01 DIAGNOSIS — M25661 Stiffness of right knee, not elsewhere classified: Secondary | ICD-10-CM | POA: Insufficient documentation

## 2019-04-27 ENCOUNTER — Ambulatory Visit: Payer: Medicare Other

## 2019-04-28 ENCOUNTER — Other Ambulatory Visit: Payer: Self-pay

## 2019-04-28 ENCOUNTER — Ambulatory Visit: Payer: Medicare Other | Admitting: Podiatry

## 2019-04-28 ENCOUNTER — Other Ambulatory Visit: Payer: Self-pay | Admitting: Podiatry

## 2019-04-28 ENCOUNTER — Ambulatory Visit (INDEPENDENT_AMBULATORY_CARE_PROVIDER_SITE_OTHER): Payer: Medicare Other

## 2019-04-28 ENCOUNTER — Encounter: Payer: Self-pay | Admitting: Podiatry

## 2019-04-28 VITALS — Temp 97.9°F

## 2019-04-28 DIAGNOSIS — M778 Other enthesopathies, not elsewhere classified: Secondary | ICD-10-CM

## 2019-04-28 DIAGNOSIS — M79671 Pain in right foot: Secondary | ICD-10-CM

## 2019-04-29 NOTE — Progress Notes (Signed)
Subjective:   Patient ID: Glenn Carol., male   DOB: 86 y.o.   MRN: [id number]   HPI Patient states he has had a lot of pain in his right dorsal foot and states that it is sore and makes it hard for him to walk.  Does not remember specific injury but did have knee replacement neuro   ROS      Objective:  Physical Exam  Vascular status intact muscle strength adequate with pain in the extensor tendon complex as it comes across the midfoot right     Assessment:  Extensor tendinitis right secondary to probable change in gait with knee replacement     Plan:  H&P conditions reviewed sterile prep done injected the extensor complex 3 mg Kenalog 5 mg liken reviewed x-rays reappoint as needed  X-rays indicate that there is no indications of stress fracture no indications of any kind of other pathology existing

## 2019-05-06 ENCOUNTER — Ambulatory Visit: Payer: Medicare Other | Attending: Internal Medicine

## 2019-05-06 DIAGNOSIS — Z23 Encounter for immunization: Secondary | ICD-10-CM | POA: Insufficient documentation

## 2019-05-06 NOTE — Progress Notes (Signed)
   [id number] Vaccination [facility]  Name:  Glenn Hunt.    MRN: [id number] DOB: Nov 19, 1931  05/06/2019  Glenn Hunt was observed post Covid-19 immunization for 15 minutes without incidence. He was provided with Vaccine Information Sheet and instruction to access the V-Safe system.   Glenn Hunt was instructed to call 911 with any severe reactions post vaccine: [name] Kitchen Difficulty breathing  . Swelling of your face and throat  . A fast heartbeat  . A bad rash all over your body  . Dizziness and weakness    Immunizations Administered    Name Date Dose VIS Date Route   Pfizer COVID-19 Vaccine 05/06/2019 10:52 AM 0.3 mL 03/11/2019 Intramuscular   Manufacturer: [place]   Lot: [id number]   [place]: [id number]

## 2019-05-31 ENCOUNTER — Ambulatory Visit: Payer: Medicare Other | Attending: Internal Medicine

## 2019-05-31 DIAGNOSIS — Z23 Encounter for immunization: Secondary | ICD-10-CM

## 2019-05-31 NOTE — Progress Notes (Signed)
   [id number] Vaccination Clinic  Name:  Glenn Hunt.    MRN: [id number] DOB: 09-Nov-1931  05/31/2019  Glenn Hunt was observed post Covid-19 immunization for 15 minutes without incident. He was provided with Vaccine Information Sheet and instruction to access the V-Safe system.   Glenn Hunt was instructed to call 911 with any severe reactions post vaccine: [name] Kitchen Difficulty breathing  . Swelling of face and throat  . A fast heartbeat  . A bad rash all over body  . Dizziness and weakness   Immunizations Administered    Name Date Dose VIS Date Route   Pfizer COVID-19 Vaccine 05/31/2019 11:43 AM 0.3 mL 03/11/2019 Intramuscular   Manufacturer: [place]   Lot: [id number]   [place]: [id number]

## 2019-06-29 ENCOUNTER — Encounter: Payer: Self-pay | Admitting: Podiatry

## 2019-06-29 ENCOUNTER — Ambulatory Visit (INDEPENDENT_AMBULATORY_CARE_PROVIDER_SITE_OTHER): Payer: Medicare Other

## 2019-06-29 ENCOUNTER — Ambulatory Visit: Payer: Medicare Other | Admitting: Podiatry

## 2019-06-29 ENCOUNTER — Other Ambulatory Visit: Payer: Self-pay

## 2019-06-29 VITALS — Temp 97.7°F

## 2019-06-29 DIAGNOSIS — Q828 Other specified congenital malformations of skin: Secondary | ICD-10-CM

## 2019-06-29 DIAGNOSIS — M795 Residual foreign body in soft tissue: Secondary | ICD-10-CM

## 2019-06-29 DIAGNOSIS — M779 Enthesopathy, unspecified: Secondary | ICD-10-CM

## 2019-06-29 NOTE — Progress Notes (Signed)
Subjective:   Patient ID: Glenn Carol., male   DOB: 86 y.o.   MRN: [id number]   HPI Patient presents with painful lesion underneath the left foot with fluid buildup and nucleated like core formation around the fifth metatarsal base   ROS      Objective:  Physical Exam  Inflammatory capsulitis plantar aspect fifth metatarsal base with porokeratotic type lesion     Assessment:  Inflammatory capsulitis fifth MPJ left with porokeratotic lesion     Plan:  Sterile prep injected the capsule 3 mg Dexasone Kenalog 5 mg Xylocaine and did deep debridement of lesion with no iatrogenic bleeding.  Reappoint as needed

## 2019-10-17 ENCOUNTER — Other Ambulatory Visit: Payer: Self-pay | Admitting: Internal Medicine

## 2019-10-17 ENCOUNTER — Ambulatory Visit
Admission: RE | Admit: 2019-10-17 | Discharge: 2019-10-17 | Disposition: A | Discharge status: Home / Self Care | Payer: Medicare Other | Source: Ambulatory Visit | Attending: Internal Medicine | Admitting: Internal Medicine

## 2019-10-17 DIAGNOSIS — W19XXXA Unspecified fall, initial encounter: Secondary | ICD-10-CM

## 2019-10-17 IMAGING — DX DG CHEST 2V
2 series · 2 of 2 positions shown · non-contrast
Comparison: [DATE]

CLINICAL DATA: Fall with right-sided rib pain

EXAM:
CHEST - 2 VIEW

[dg chest 2 view (1 of 2)]
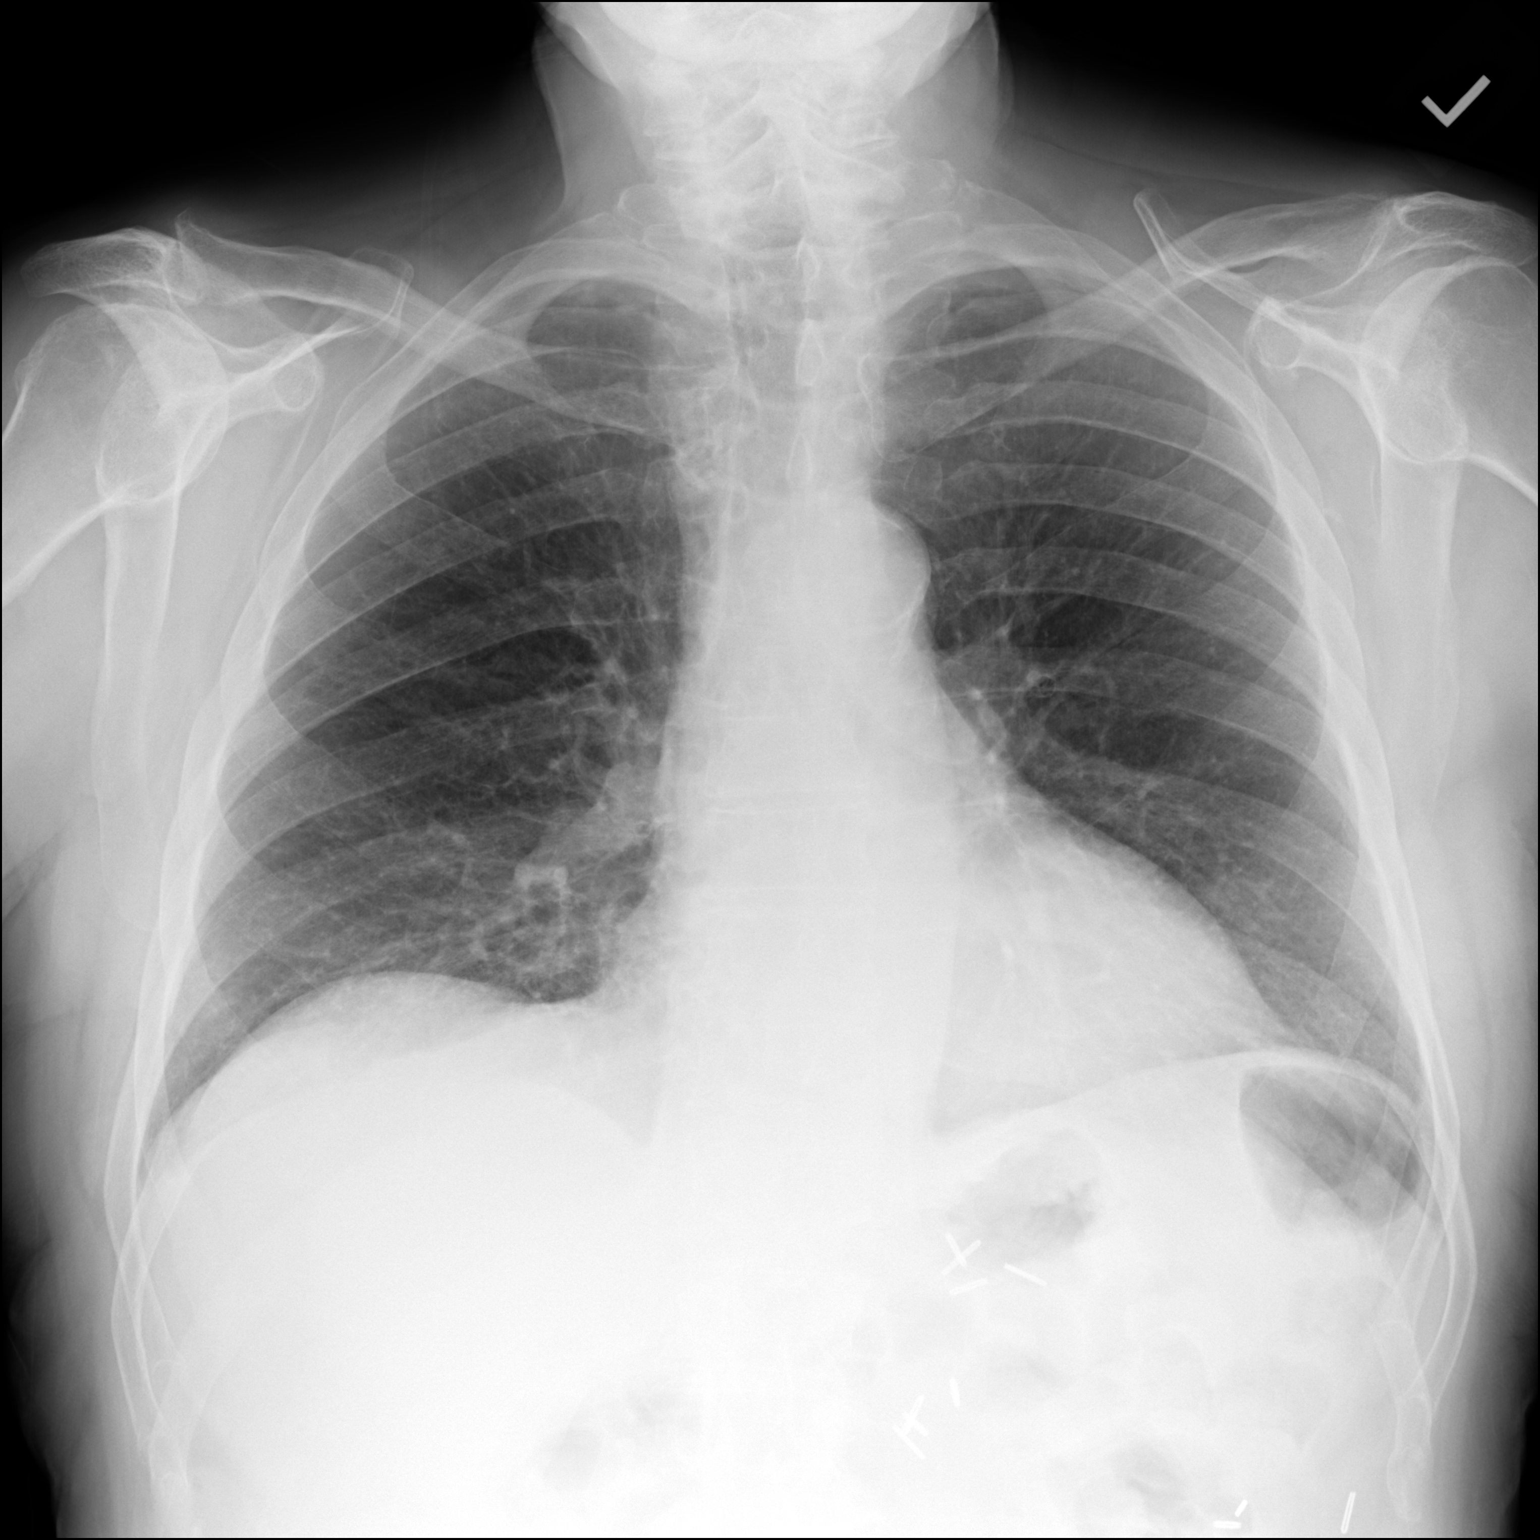

[dg chest 2 view (2 of 2)]
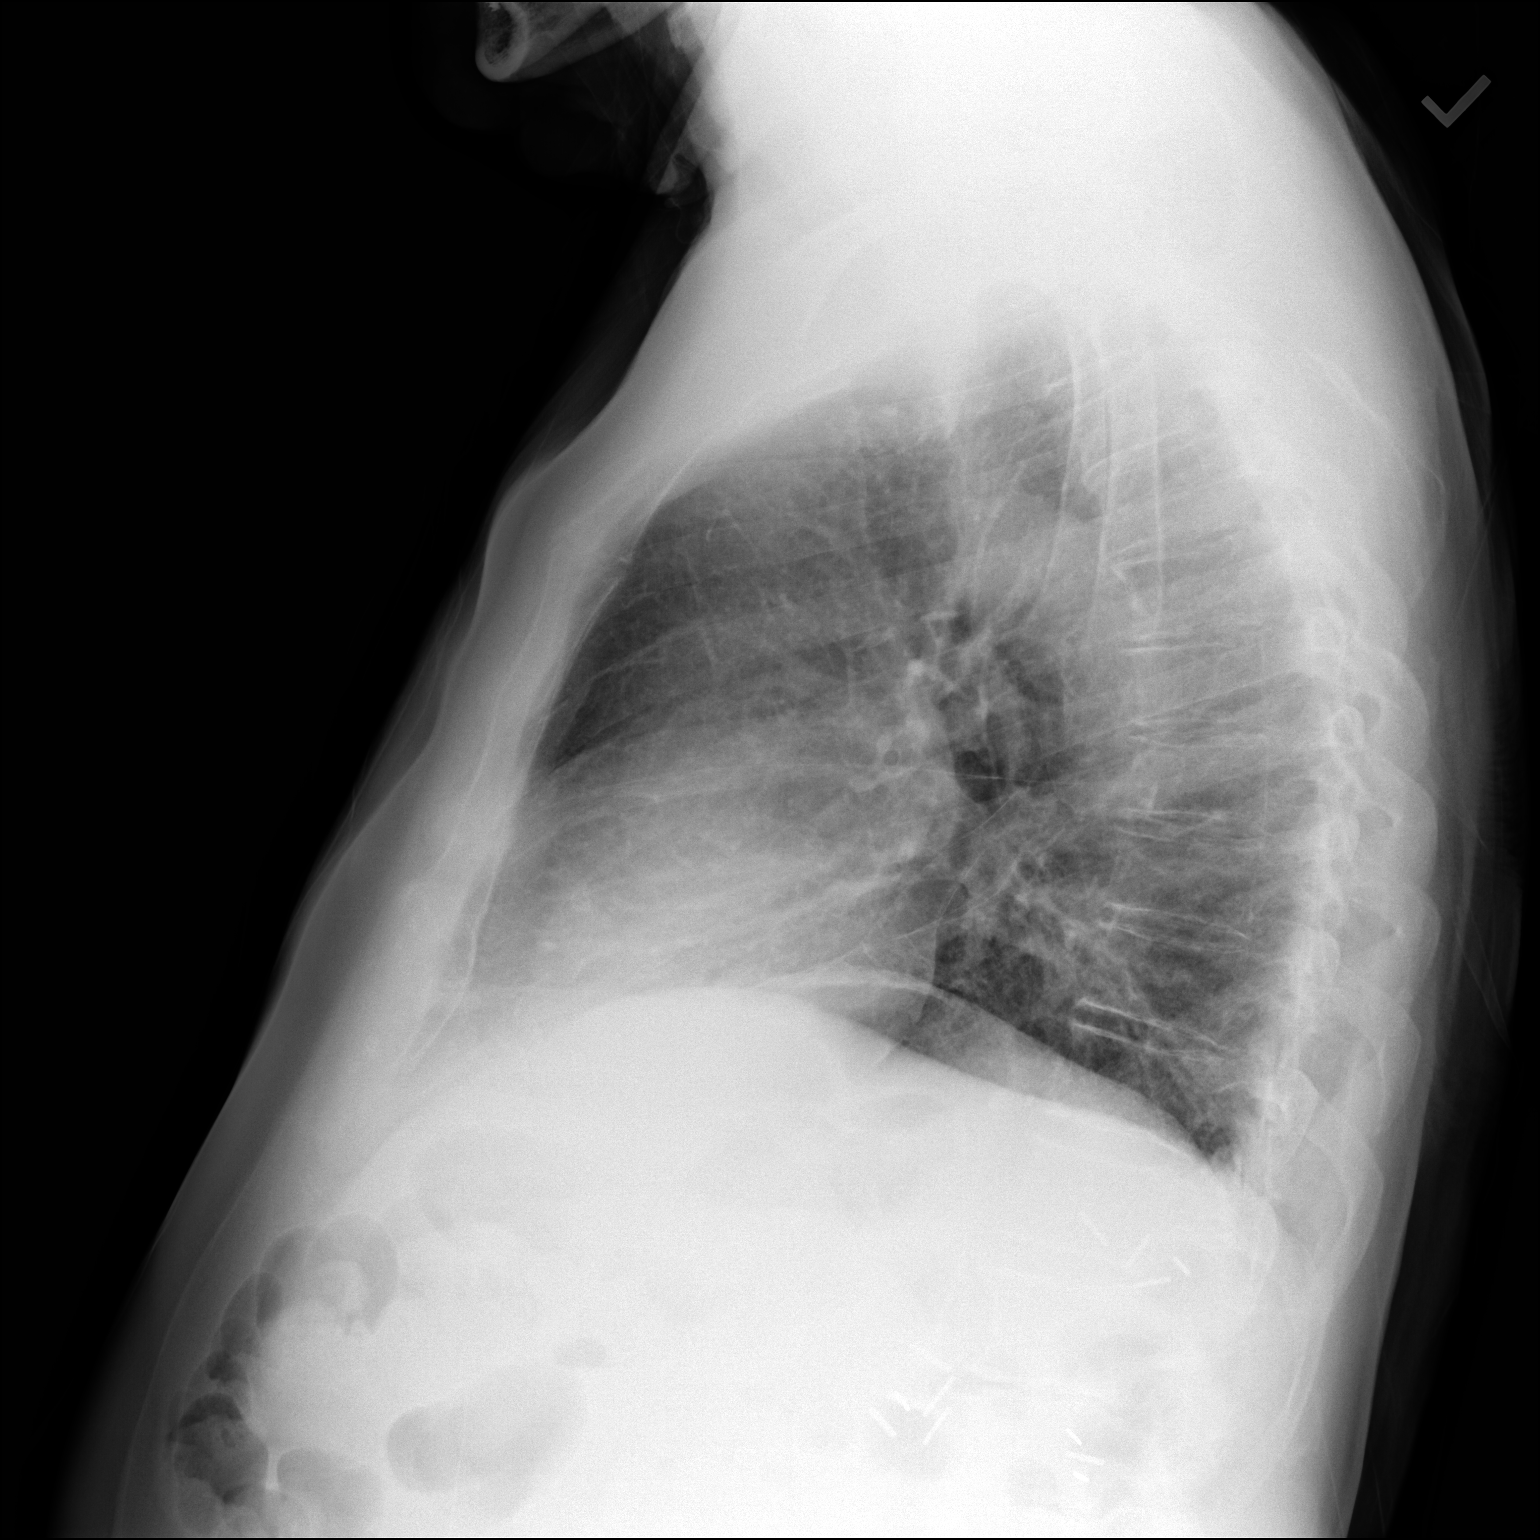

[2 of 2 positions shown; findings below may reference images not displayed]

FINDINGS: The heart size and mediastinal contours are within normal limits.
Both lungs are clear. Aortic atherosclerosis. The visualized
skeletal structures are unremarkable.
IMPRESSION: No active cardiopulmonary disease.

## 2019-12-21 ENCOUNTER — Ambulatory Visit
Admission: RE | Admit: 2019-12-21 | Discharge: 2019-12-21 | Disposition: A | Discharge status: Home / Self Care | Payer: Medicare Other | Source: Ambulatory Visit | Attending: Internal Medicine | Admitting: Internal Medicine

## 2019-12-21 ENCOUNTER — Other Ambulatory Visit: Payer: Self-pay | Admitting: Internal Medicine

## 2019-12-21 DIAGNOSIS — G44319 Acute post-traumatic headache, not intractable: Secondary | ICD-10-CM

## 2019-12-21 IMAGING — CT CT HEAD W/O CM
4 series · 16 of 47 positions shown, 18 images · non-contrast
Comparison: None.

CLINICAL DATA: Posttraumatic headaches after fall last week.

EXAM:
CT HEAD WITHOUT CONTRAST
TECHNIQUE: Contiguous axial images were obtained from the base of the skull
through the vertex without intravenous contrast.

[Series 2: head 5.00 hr40 s3 axial ibhc · axial · 0.44mm/px · z∈[-731,-616]mm · 6 of 33 slices shown, 8 images]
[im 5/33  brain]
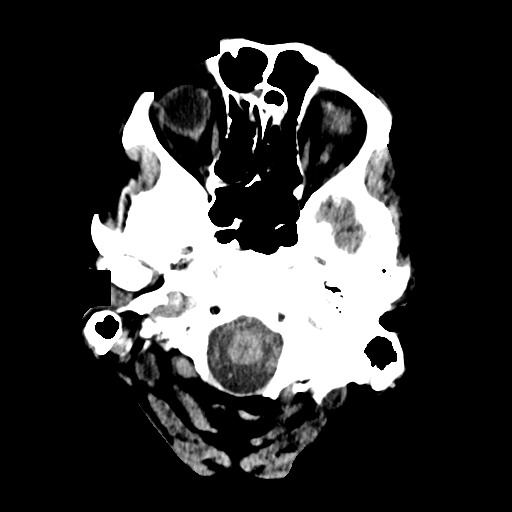
[im 5/33  bone]
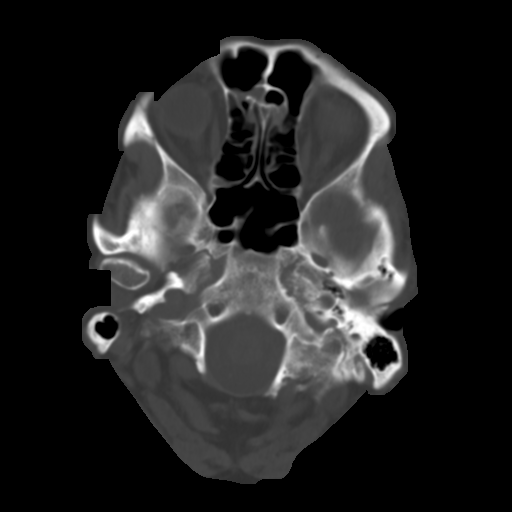
[im 10/33  brain]
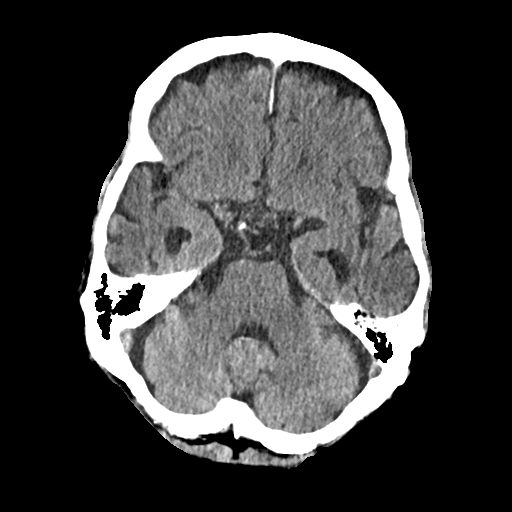
[im 14/33  brain]
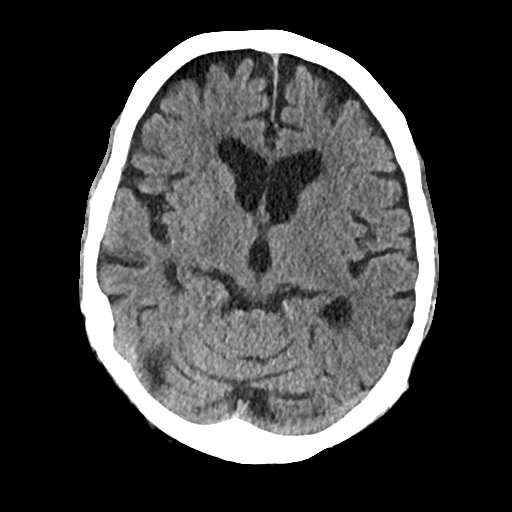
[im 19/33  brain]
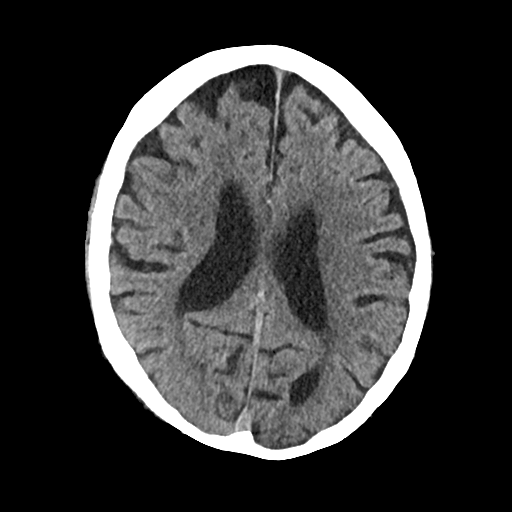
[im 23/33  brain]
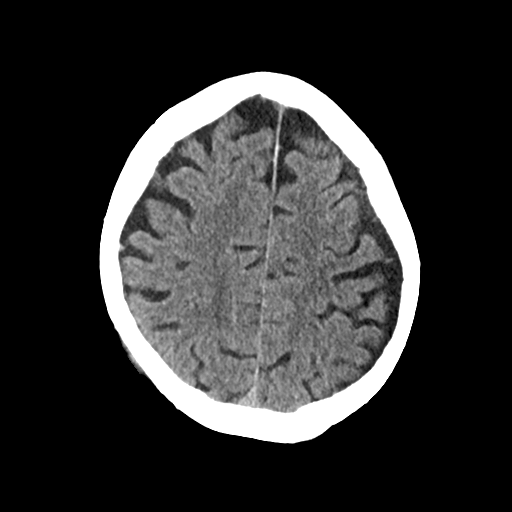
[im 23/33  bone]
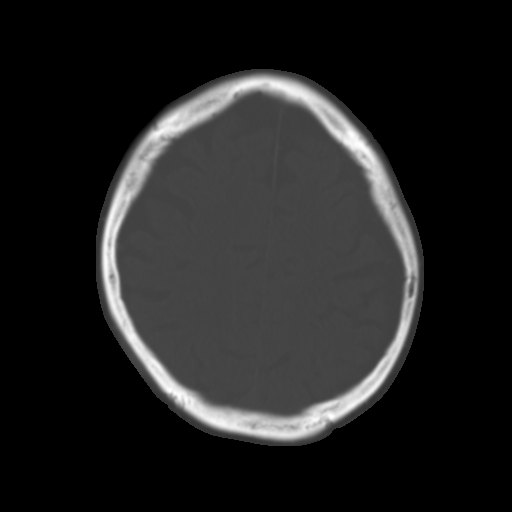
[im 28/33  brain]
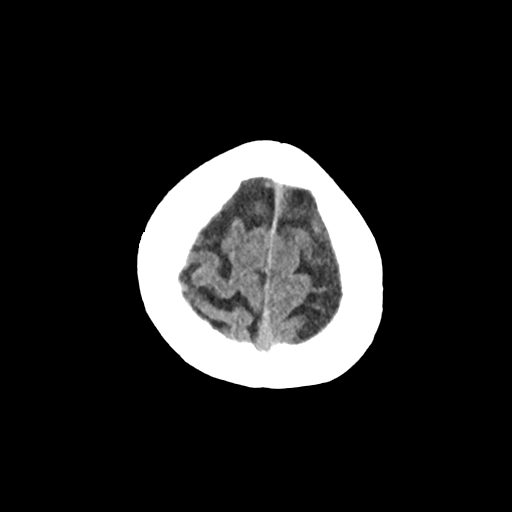

[Series 3: head 2.00 hr60 s3 axial bone · axial · 0.44mm/px · z∈[-738,-682]mm · 4 of 84 slices shown]
[im 8/84  bone]
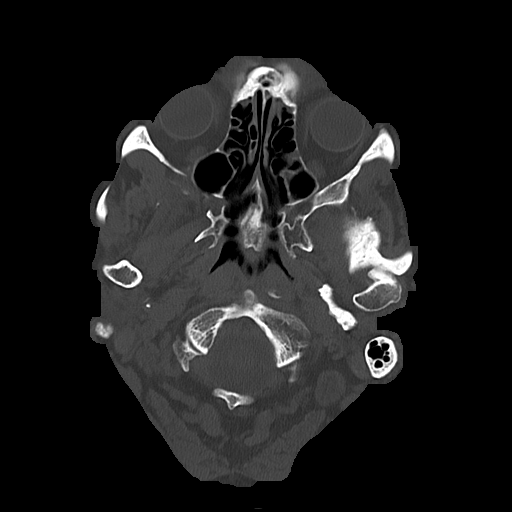
[im 16/84  bone]
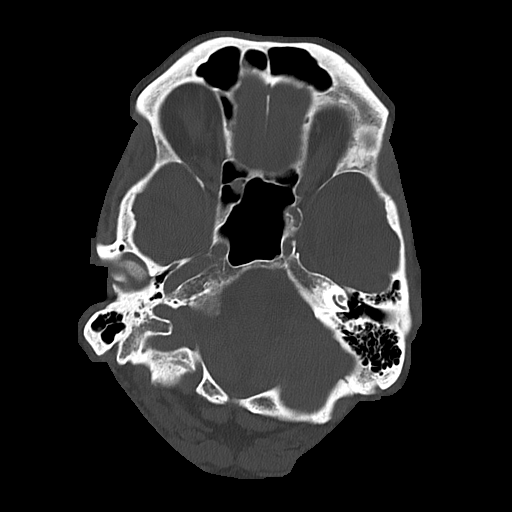
[im 28/84  bone]
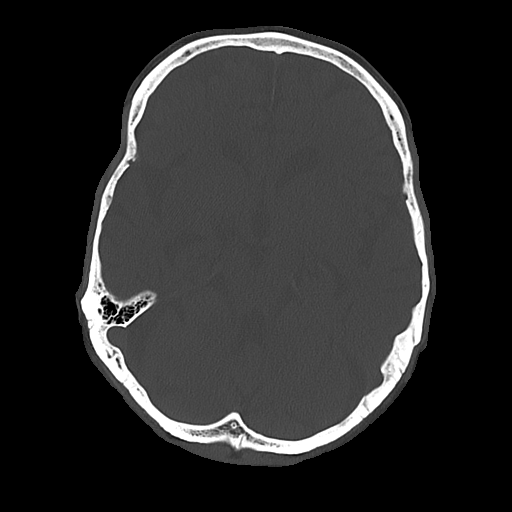
[im 36/84  bone]
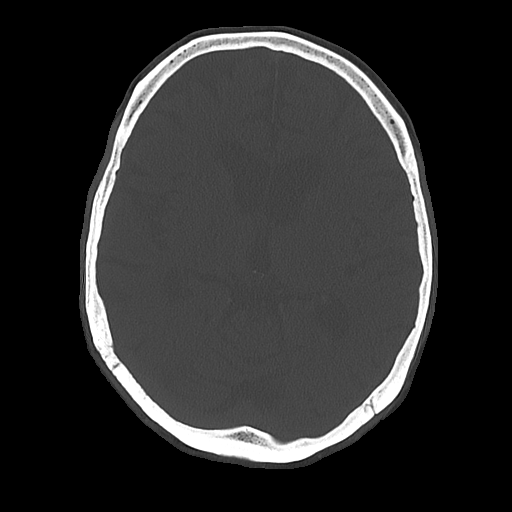

[Series 4: head 3.00 hr40 s3 sag · sagittal · 0.35mm/px · 3 of 113 slices shown]
[im 38/113  brain]
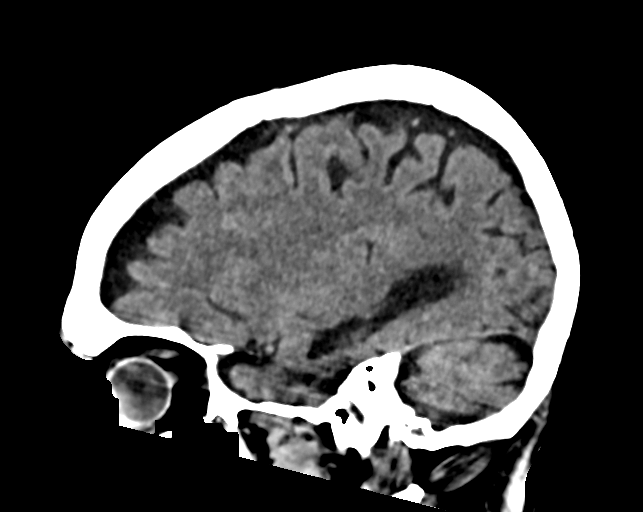
[im 57/113  brain]
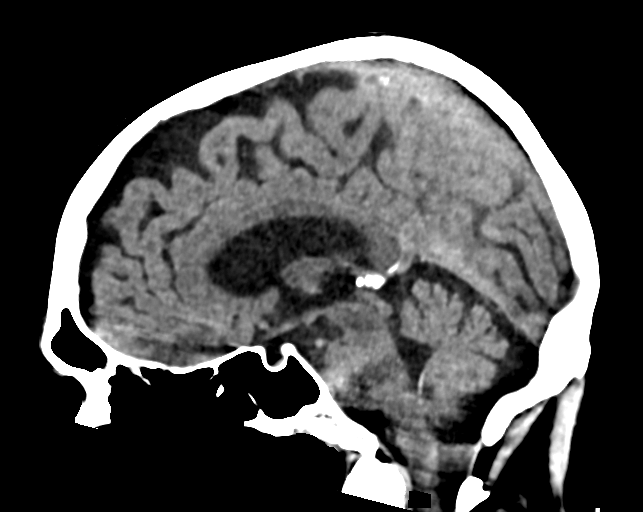
[im 75/113  brain]
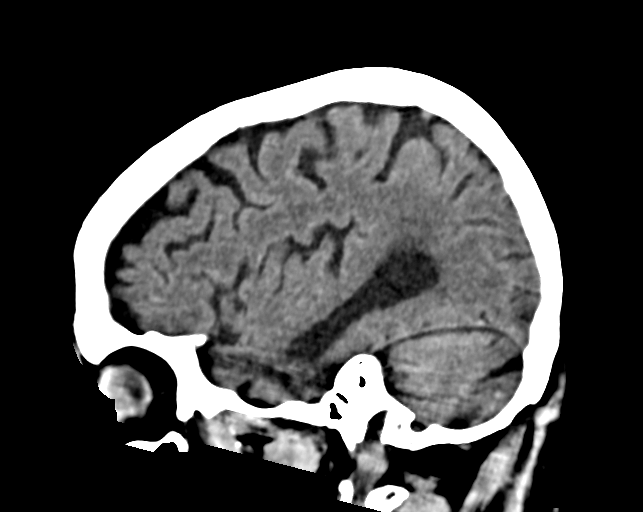

[Series 6: head 3.00 hr40 s3 cor · coronal · 0.35mm/px · 3 of 113 slices shown]
[im 40/113  brain]
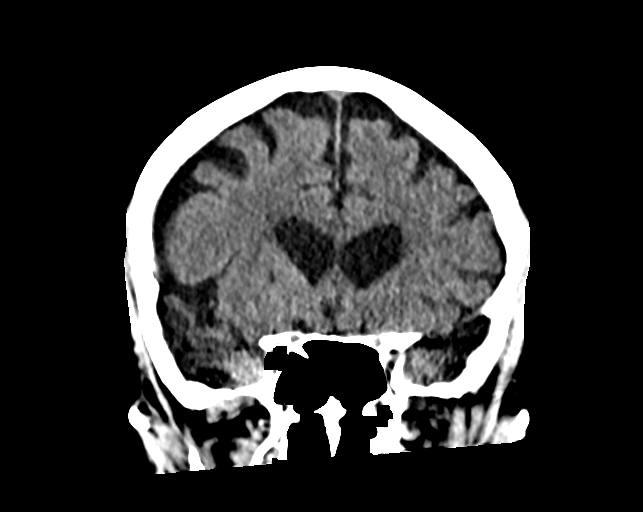
[im 51/113  brain]
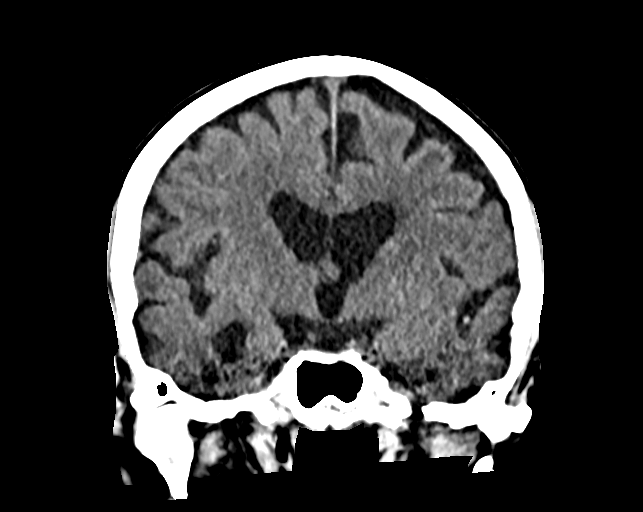
[im 62/113  brain]
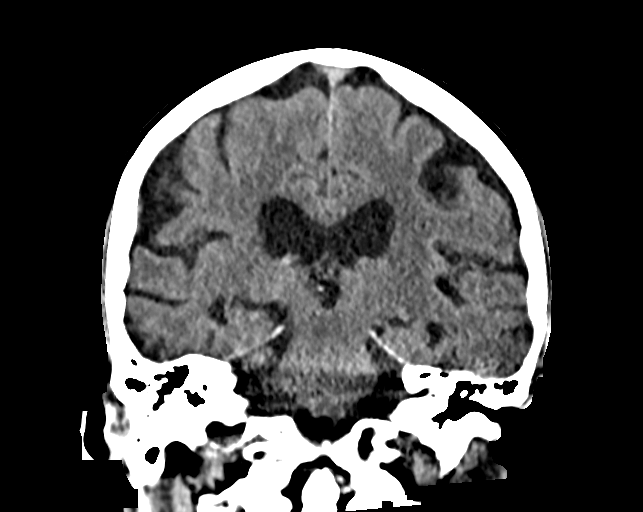

[16 of 47 positions shown; findings below may reference images not displayed]

FINDINGS: Brain: Mild diffuse cortical atrophy is noted. Mild chronic ischemic
white matter disease is noted. No mass effect or midline shift is
noted. Ventricular size is within normal limits. There is no
evidence of mass lesion, hemorrhage or acute infarction.

Vascular: No hyperdense vessel or unexpected calcification.

Skull: Normal. Negative for fracture or focal lesion.

Sinuses/Orbits: No acute finding.

Other: None.
IMPRESSION: Mild diffuse cortical atrophy. Mild chronic ischemic white matter
disease. No acute intracranial abnormality seen.

## 2020-02-08 ENCOUNTER — Other Ambulatory Visit: Payer: Self-pay

## 2020-02-08 ENCOUNTER — Emergency Department (HOSPITAL_COMMUNITY): Payer: Medicare Other

## 2020-02-08 ENCOUNTER — Emergency Department (HOSPITAL_COMMUNITY)
Admission: EM | Admit: 2020-02-08 | Discharge: 2020-02-08 | Disposition: A | Discharge status: Home / Self Care | Payer: Medicare Other | Attending: Emergency Medicine | Admitting: Emergency Medicine

## 2020-02-08 DIAGNOSIS — Z85528 Personal history of other malignant neoplasm of kidney: Secondary | ICD-10-CM | POA: Insufficient documentation

## 2020-02-08 DIAGNOSIS — Z20822 Contact with and (suspected) exposure to covid-19: Secondary | ICD-10-CM | POA: Insufficient documentation

## 2020-02-08 DIAGNOSIS — S0990XA Unspecified injury of head, initial encounter: Secondary | ICD-10-CM | POA: Diagnosis not present

## 2020-02-08 DIAGNOSIS — S43004A Unspecified dislocation of right shoulder joint, initial encounter: Secondary | ICD-10-CM | POA: Diagnosis not present

## 2020-02-08 DIAGNOSIS — S4991XA Unspecified injury of right shoulder and upper arm, initial encounter: Secondary | ICD-10-CM | POA: Diagnosis present

## 2020-02-08 DIAGNOSIS — Z96651 Presence of right artificial knee joint: Secondary | ICD-10-CM | POA: Diagnosis not present

## 2020-02-08 DIAGNOSIS — N183 Chronic kidney disease, stage 3 unspecified: Secondary | ICD-10-CM | POA: Diagnosis not present

## 2020-02-08 DIAGNOSIS — I2510 Atherosclerotic heart disease of native coronary artery without angina pectoris: Secondary | ICD-10-CM | POA: Insufficient documentation

## 2020-02-08 DIAGNOSIS — Z85828 Personal history of other malignant neoplasm of skin: Secondary | ICD-10-CM | POA: Diagnosis not present

## 2020-02-08 DIAGNOSIS — W19XXXA Unspecified fall, initial encounter: Secondary | ICD-10-CM | POA: Diagnosis not present

## 2020-02-08 DIAGNOSIS — F17210 Nicotine dependence, cigarettes, uncomplicated: Secondary | ICD-10-CM | POA: Insufficient documentation

## 2020-02-08 DIAGNOSIS — Z955 Presence of coronary angioplasty implant and graft: Secondary | ICD-10-CM | POA: Insufficient documentation

## 2020-02-08 DIAGNOSIS — Z79899 Other long term (current) drug therapy: Secondary | ICD-10-CM | POA: Diagnosis not present

## 2020-02-08 DIAGNOSIS — Y92009 Unspecified place in unspecified non-institutional (private) residence as the place of occurrence of the external cause: Secondary | ICD-10-CM | POA: Diagnosis not present

## 2020-02-08 LAB — BASIC METABOLIC PANEL
Anion gap: 15 (ref 5–15)
BUN: 42 mg/dL — ABNORMAL HIGH (ref 8–23)
CO2: 22 mmol/L (ref 22–32)
Calcium: 10.2 mg/dL (ref 8.9–10.3)
Chloride: 100 mmol/L (ref 98–111)
Creatinine, Ser: 1.63 mg/dL — ABNORMAL HIGH (ref 0.61–1.24)
GFR, Estimated: 41 mL/min — ABNORMAL LOW (ref >60)
Glucose, Bld: 144 mg/dL — ABNORMAL HIGH (ref 70–99)
Potassium: 3.6 mmol/L (ref 3.5–5.1)
Sodium: 137 mmol/L (ref 135–145)

## 2020-02-08 LAB — CBC WITH DIFFERENTIAL/PLATELET
Abs Immature Granulocytes: 0.06 10*3/uL (ref 0.00–0.07)
Basophils Absolute: 0 10*3/uL (ref 0.0–0.1)
Basophils Relative: 0 %
Eosinophils Absolute: 0 10*3/uL (ref 0.0–0.5)
Eosinophils Relative: 0 %
HCT: 39.5 % (ref 39.0–52.0)
Hemoglobin: 13.3 g/dL (ref 13.0–17.0)
Immature Granulocytes: 1 %
Lymphocytes Relative: 5 %
Lymphs Abs: 0.6 10*3/uL — ABNORMAL LOW (ref 0.7–4.0)
MCH: 34.4 pg — ABNORMAL HIGH (ref 26.0–34.0)
MCHC: 33.7 g/dL (ref 30.0–36.0)
MCV: 102.1 fL — ABNORMAL HIGH (ref 80.0–100.0)
Monocytes Absolute: 0.9 10*3/uL (ref 0.1–1.0)
Monocytes Relative: 7 %
Neutro Abs: 10.4 10*3/uL — ABNORMAL HIGH (ref 1.7–7.7)
Neutrophils Relative %: 87 %
Platelets: 204 10*3/uL (ref 150–400)
RBC: 3.87 MIL/uL — ABNORMAL LOW (ref 4.22–5.81)
RDW: 14.8 % (ref 11.5–15.5)
WBC: 11.9 10*3/uL — ABNORMAL HIGH (ref 4.0–10.5)
nRBC: 0 % (ref 0.0–0.2)

## 2020-02-08 LAB — RESPIRATORY PANEL BY RT PCR (FLU A&B, COVID)
Influenza A by PCR: NEGATIVE
Influenza B by PCR: NEGATIVE
SARS Coronavirus 2 by RT PCR: NEGATIVE

## 2020-02-08 LAB — ETHANOL: Alcohol, Ethyl (B): 19 mg/dL — ABNORMAL HIGH (ref <10)

## 2020-02-08 IMAGING — CT CT SHOULDER*R* W/O CM
3 of 5 series · 15 of 33 positions shown, 18 images · non-contrast
Comparison: Right shoulder x-rays from same day.

CLINICAL DATA: Right shoulder dislocation.  Unsuccessful reduction.

EXAM:
CT OF THE UPPER RIGHT EXTREMITY WITHOUT CONTRAST
TECHNIQUE: Multidetector CT imaging of the right shoulder was performed
according to the standard protocol.

[Series 9: cor st · coronal · 0.36mm/px · 3 of 101 slices shown]
[im 30/101  bone]
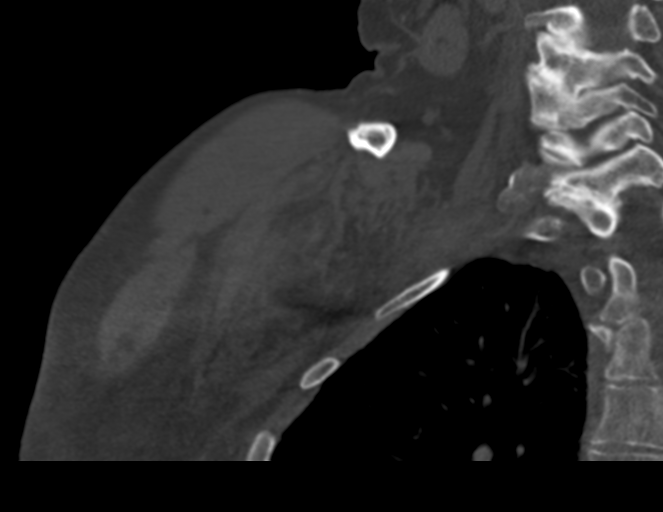
[im 44/101  bone]
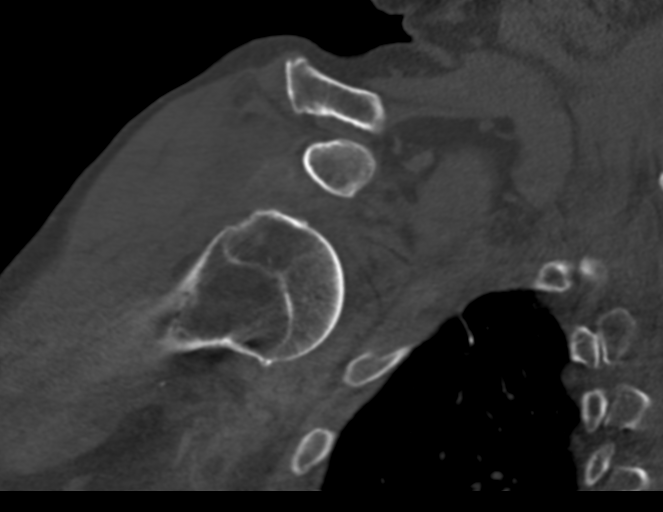
[im 58/101  bone]
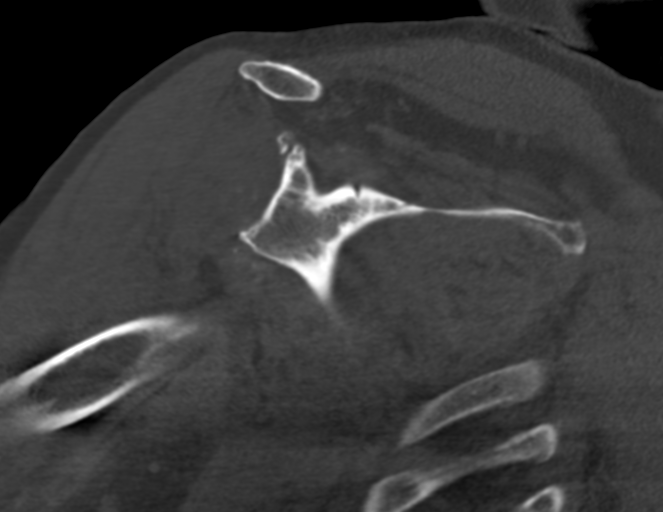

[Series 10: sag st · sagittal · 0.42mm/px · 5 of 129 slices shown, 6 images]
[im 43/129  bone]
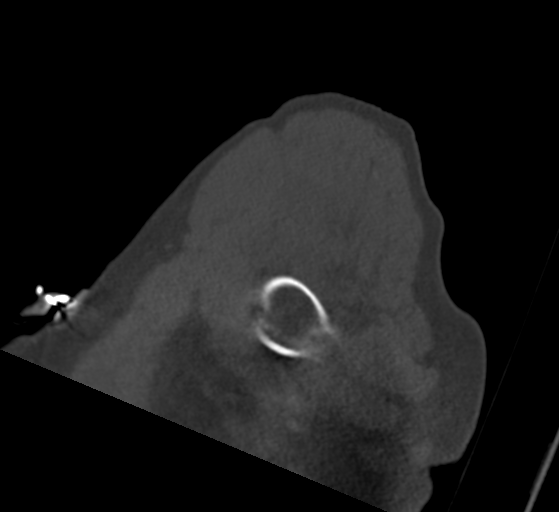
[im 54/129  bone]
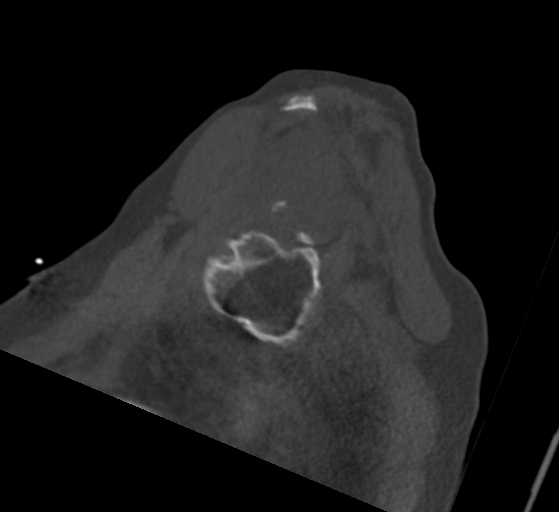
[im 65/129  soft-tissue]
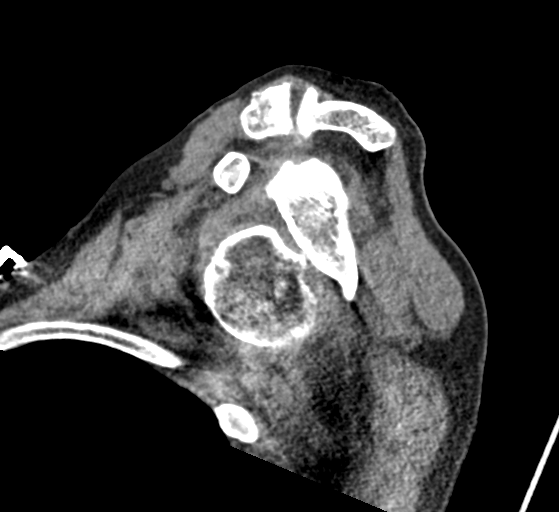
[im 65/129  bone]
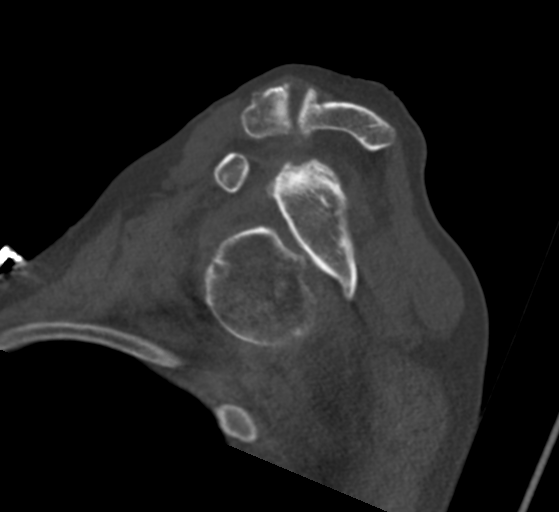
[im 75/129  bone]
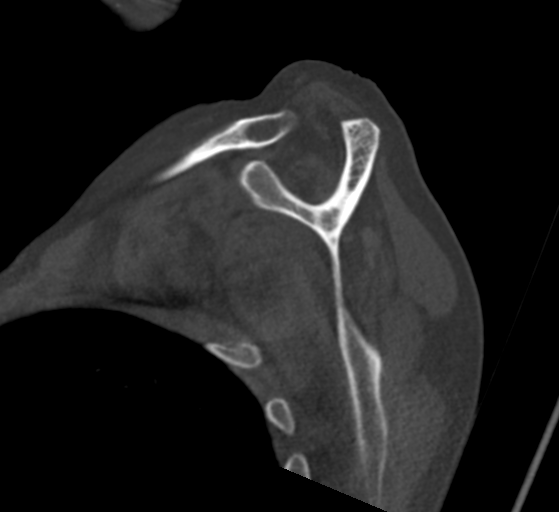
[im 86/129  bone]
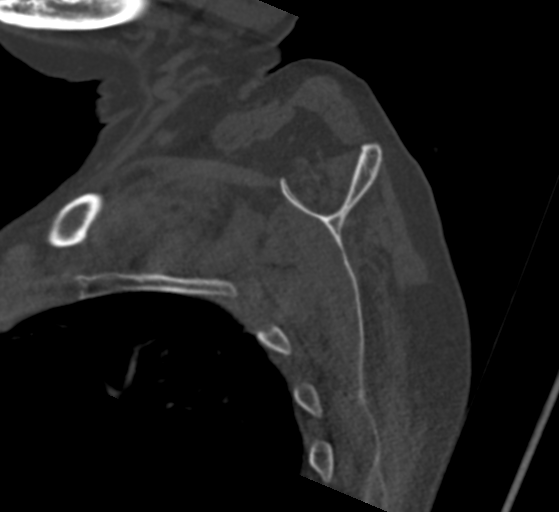

[Series 12: st thins · axial · 0.74mm/px · z∈[-420,-287]mm · 7 of 296 slices shown, 9 images]
[im 37/296  soft-tissue]
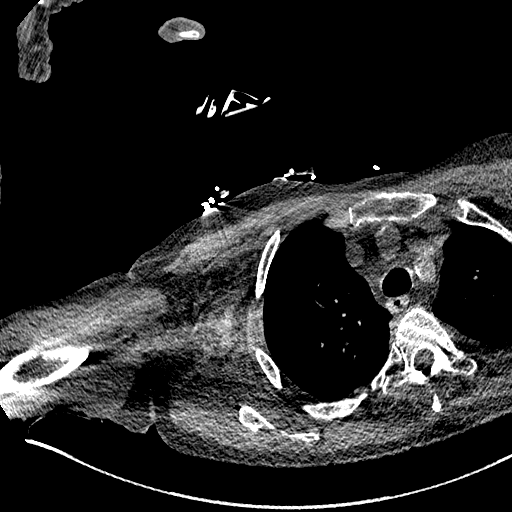
[im 37/296  bone]
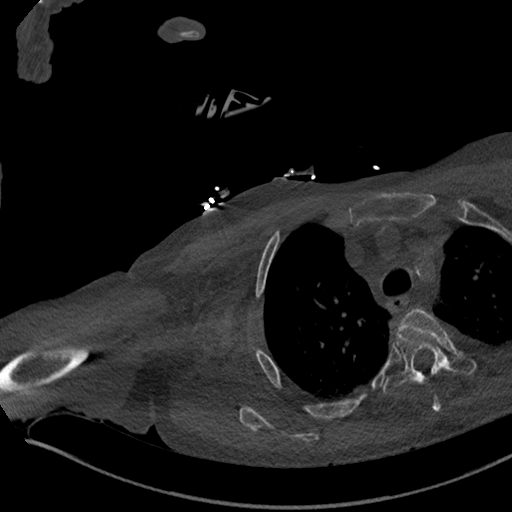
[im 74/296  bone]
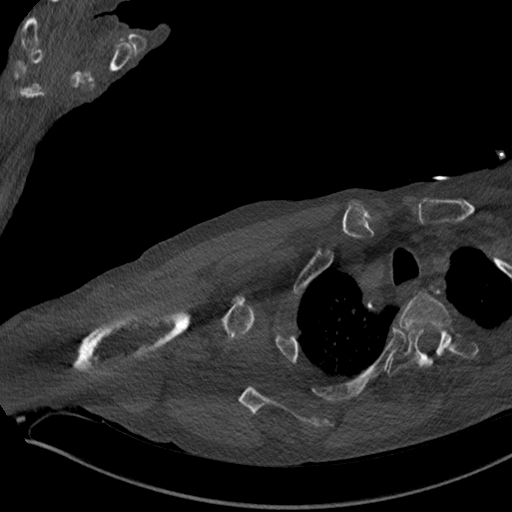
[im 111/296  bone]
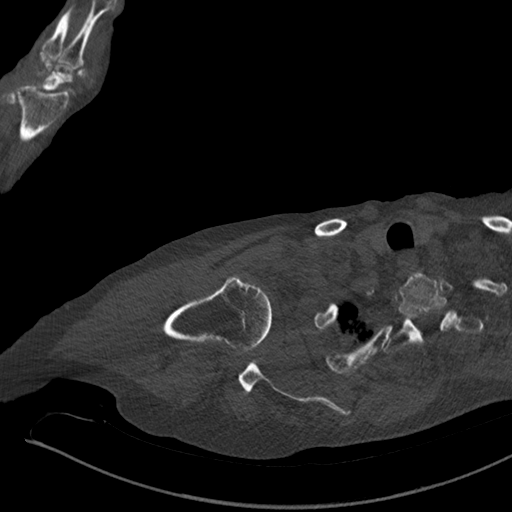
[im 148/296  bone]
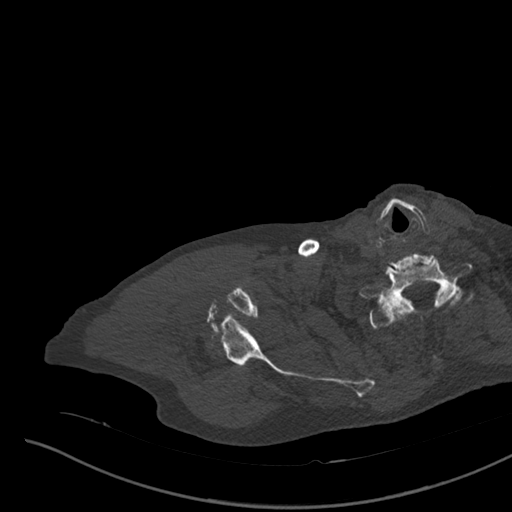
[im 185/296  soft-tissue]
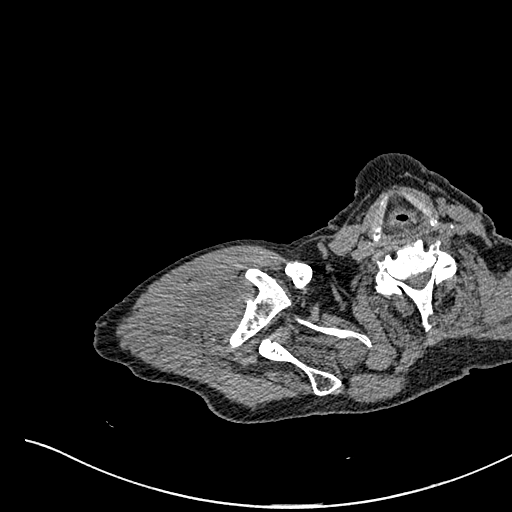
[im 185/296  bone]
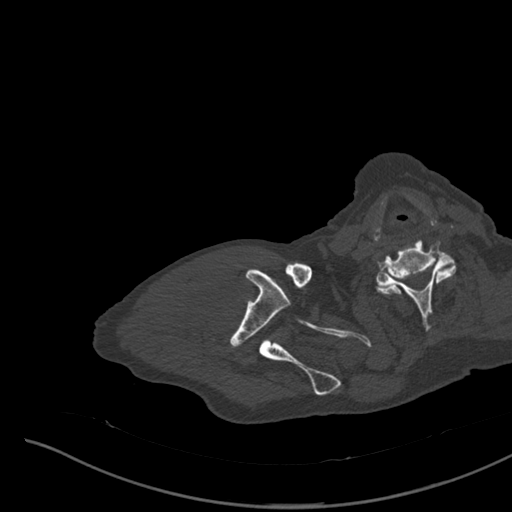
[im 222/296  bone]
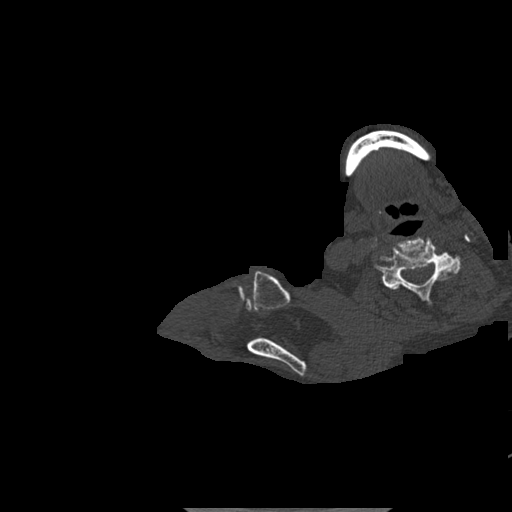
[im 259/296  bone]
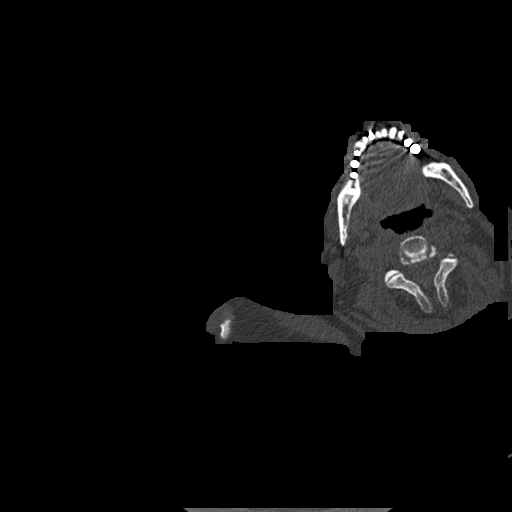

[15 of 33 positions shown; findings below may reference images not displayed]

FINDINGS: Bones/Joint/Cartilage

Continued anterior dislocation of the humeral head with respect to
the glenoid. Large impaction fracture of the posterosuperior humeral
head again noted. No glenoid fracture. Mild glenohumeral and
acromioclavicular osteoarthritis. Small hemarthrosis.

Advanced degenerative changes of the cervical spine.

Ligaments

Ligaments are suboptimally evaluated by CT.

Muscles and Tendons
Mild supraspinatus and moderate infraspinatus muscle atrophy.

Soft tissue
Soft tissue swelling in the right axilla. No fluid collection or
hematoma. No soft tissue mass.

Mild centrilobular emphysema in the lungs.
IMPRESSION: 1. Continued anterior shoulder dislocation with large Hill-Sachs
deformity.
2. Mild glenohumeral and acromioclavicular osteoarthritis.

## 2020-02-08 IMAGING — DX DG SHOULDER 2+V*R*
2 series · 2 of 2 positions shown · non-contrast
Comparison: None.

CLINICAL DATA: Pain, fall

EXAM:
RIGHT SHOULDER - 2+ VIEW

[shoulder y view]
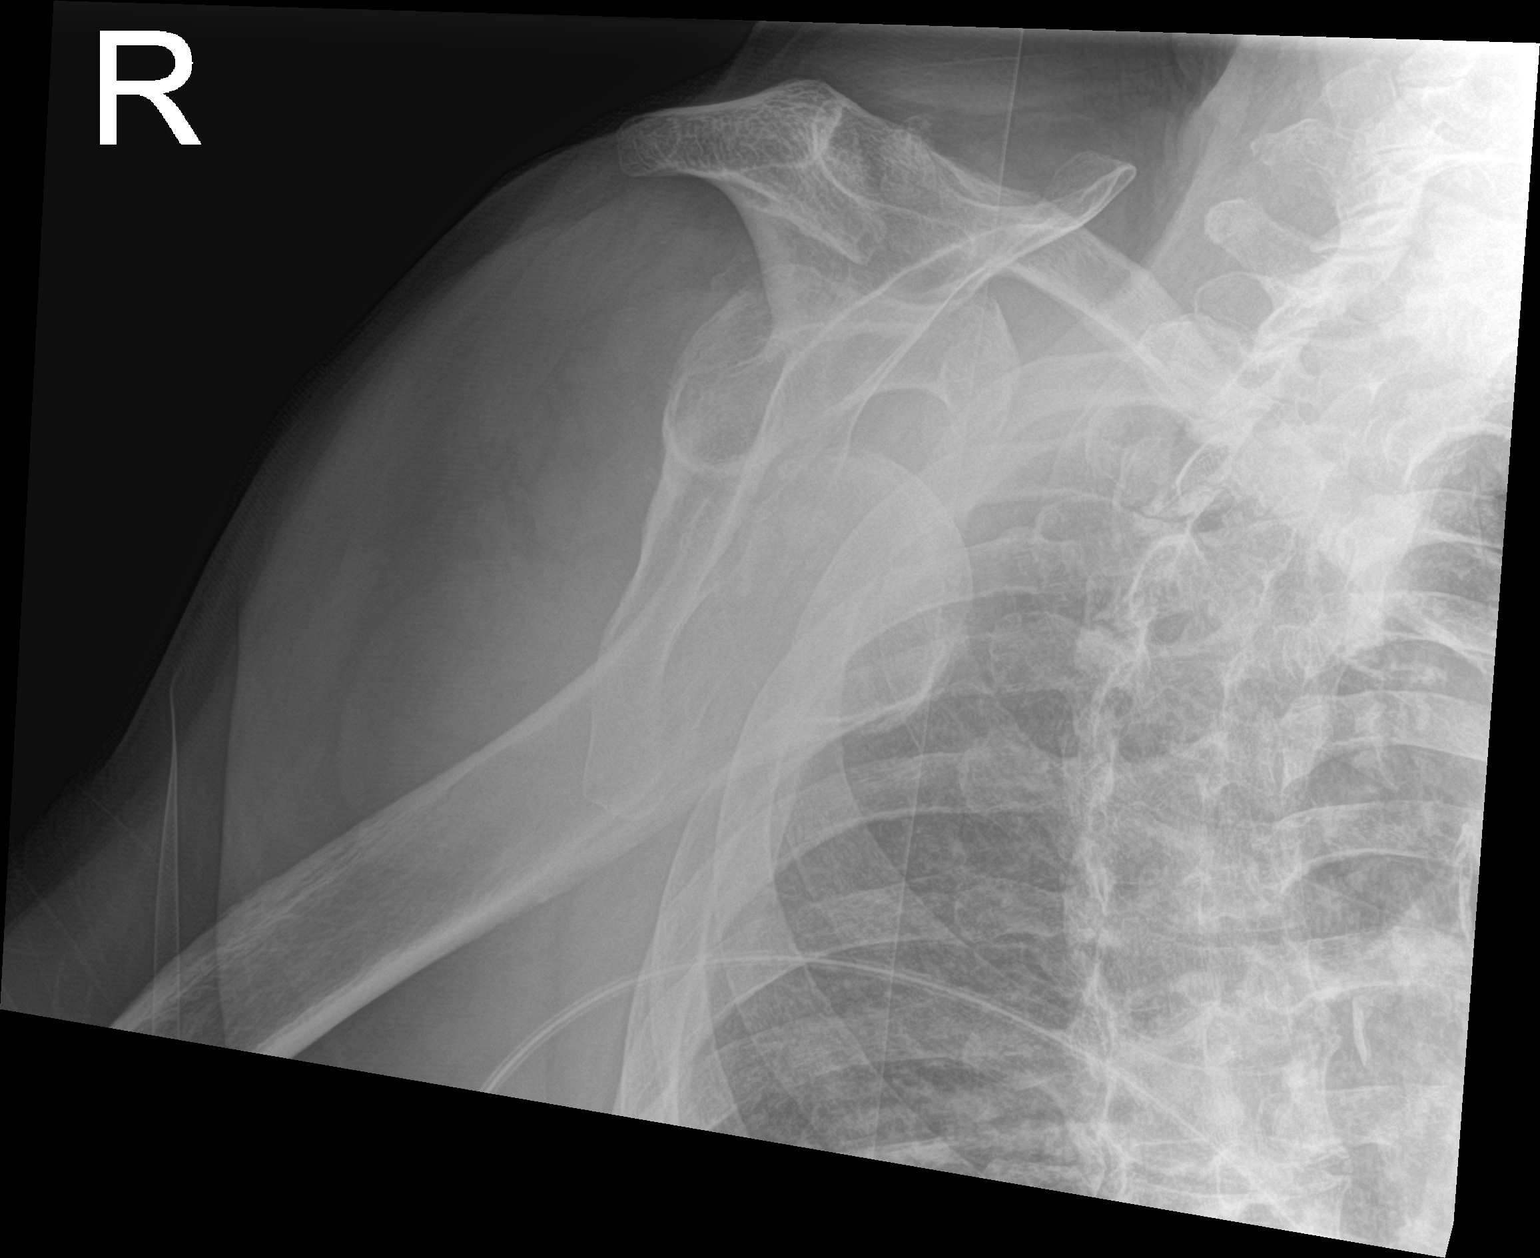

[shoulder ap neutral]
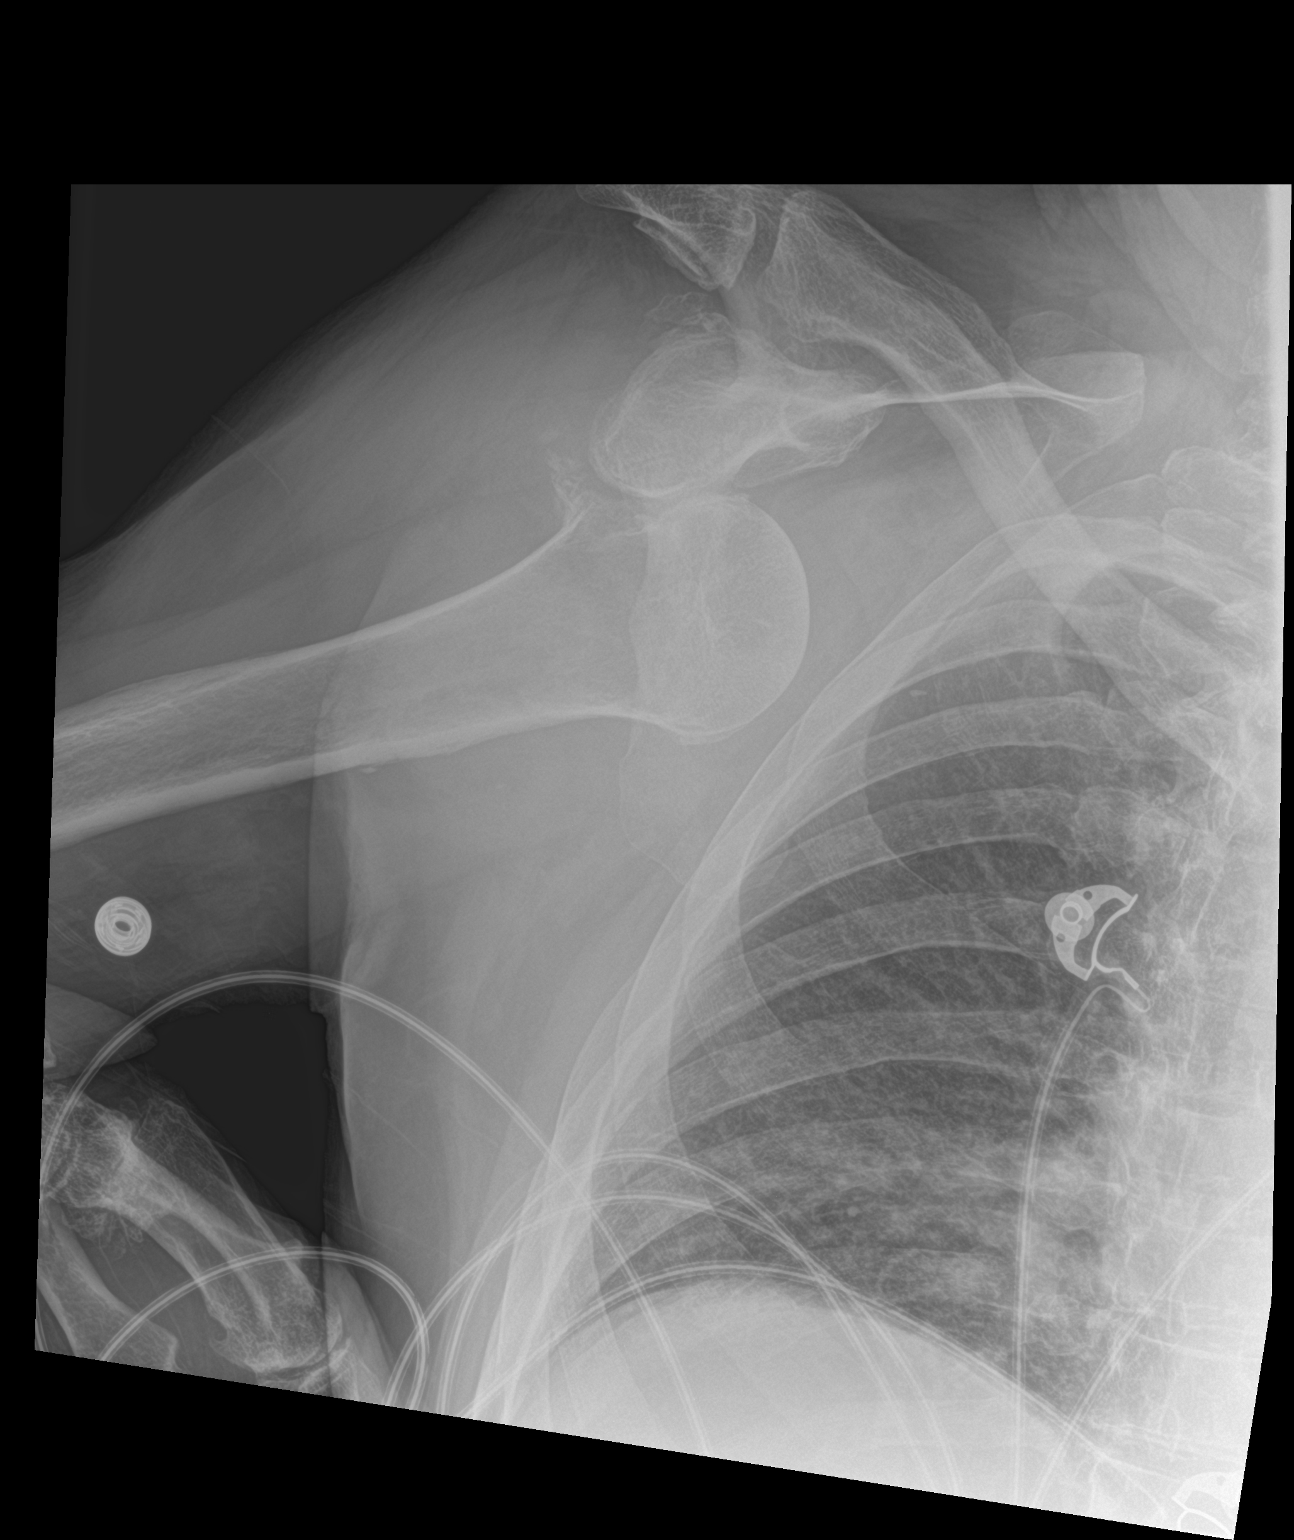

[2 of 2 positions shown; findings below may reference images not displayed]

FINDINGS: Perched anterior shoulder dislocation with associated impaction
fracture of the posterolateral right humeral head (Hill-Sachs
lesion). Additional fragmentation posteriorly likely to reflect a
displaced fracture fragment from the humerus. A discrete glenoid
injury is not well visualized to suggest a bony Bankart. There is a
background of moderate glenohumeral and acromioclavicular arthrosis.
Diffuse soft tissue swelling of the shoulder included portions of
the chest are unremarkable aside from some basilar atelectatic
changes. Additional degenerative features noted in the incidentally
included hand. Telemetry leads overlie the chest.
IMPRESSION: 1. Perched anterior shoulder dislocation with associated impaction
fracture of the posterolateral right humeral head (Hill-Sachs
lesion).
2. Additional fragmentation posteriorly likely to reflect a
displaced fracture fragment from the humerus.
3. A discrete glenoid injury is not well visualized to suggest a
bony Bankart though should reimage following relocation.

## 2020-02-08 IMAGING — CT CT HEAD W/O CM
3 series · 16 of 47 positions shown, 19 images · non-contrast
Comparison: [DATE]

CLINICAL DATA: Fall

EXAM:
CT HEAD WITHOUT CONTRAST
TECHNIQUE: Contiguous axial images were obtained from the base of the skull
through the vertex without intravenous contrast.

[Series 3: head 5.0 h30s · axial · 0.43mm/px · z∈[-104,+36]mm · 10 of 34 slices shown, 13 images]
[im 3/34  brain]
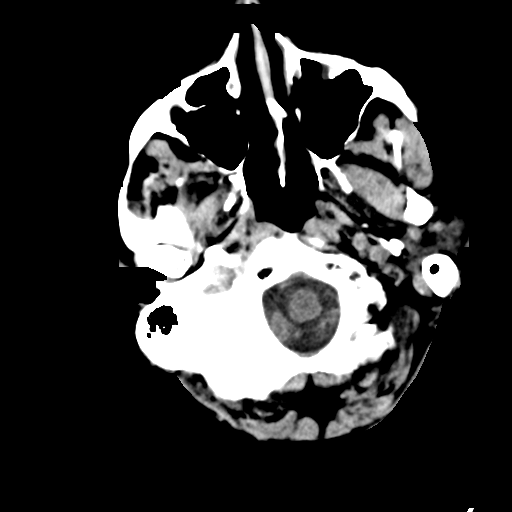
[im 3/34  bone]
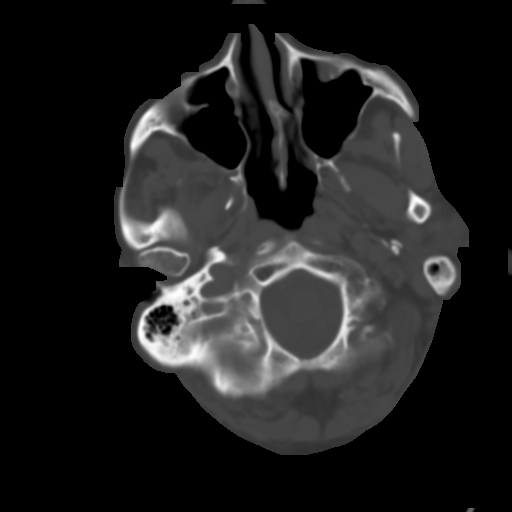
[im 6/34  brain]
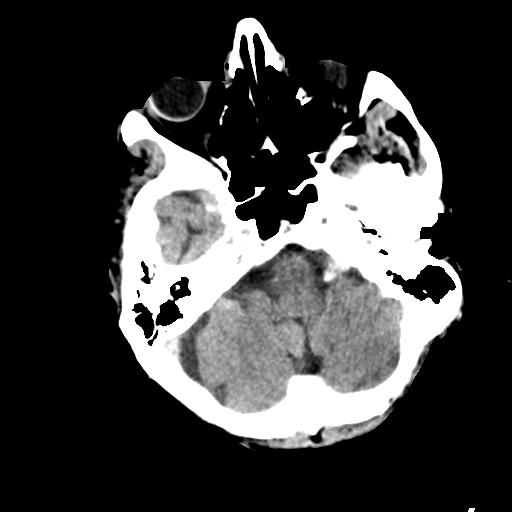
[im 10/34  brain]
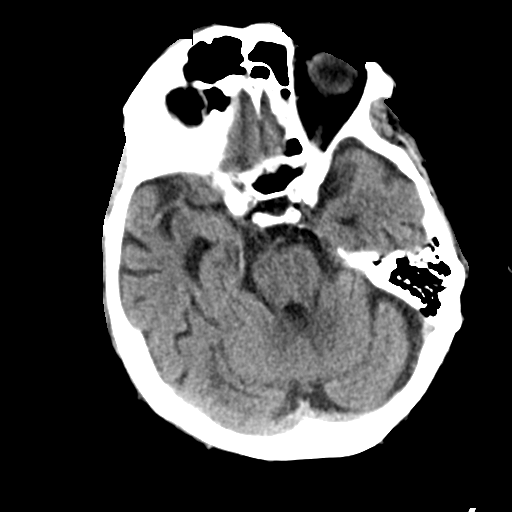
[im 12/34  brain]
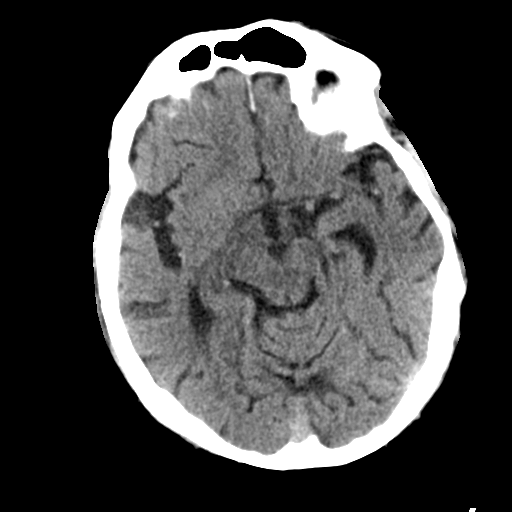
[im 15/34  brain]
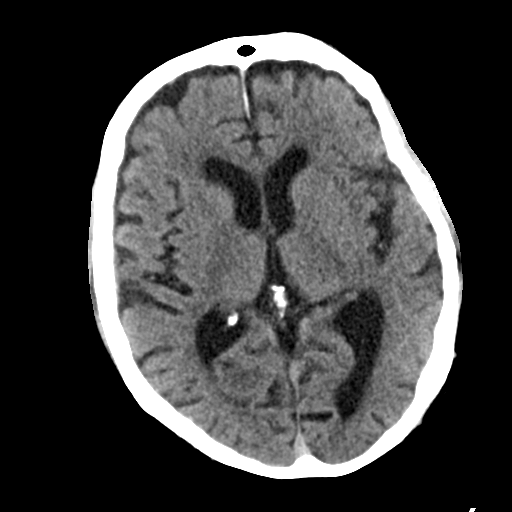
[im 15/34  bone]
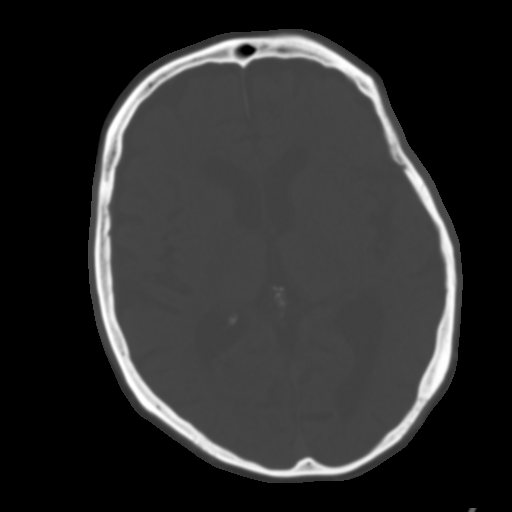
[im 19/34  brain]
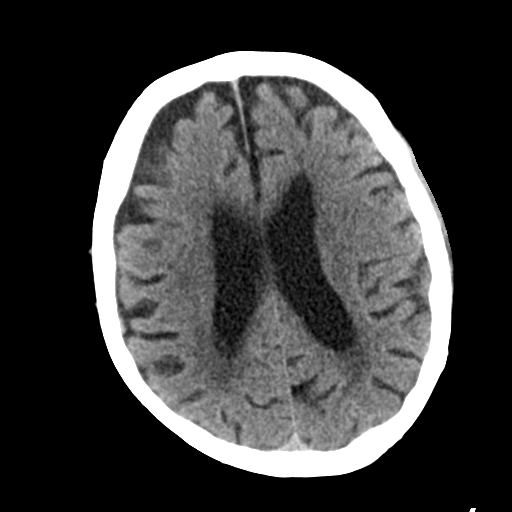
[im 22/34  brain]
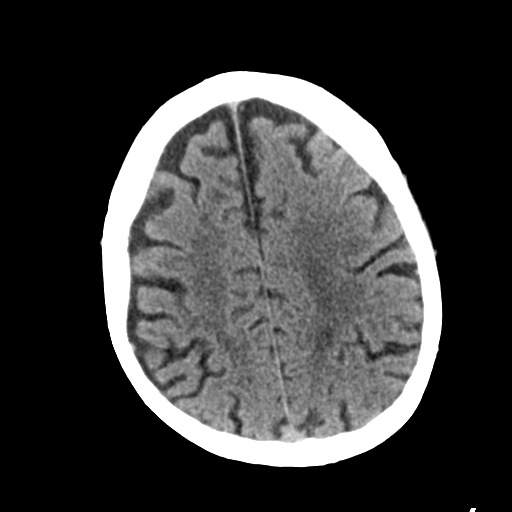
[im 26/34  brain]
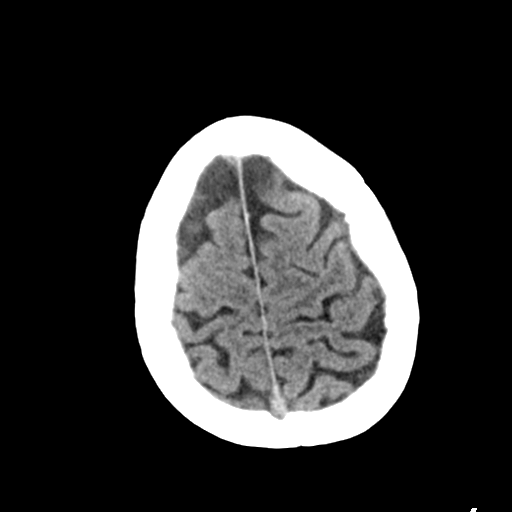
[im 28/34  brain]
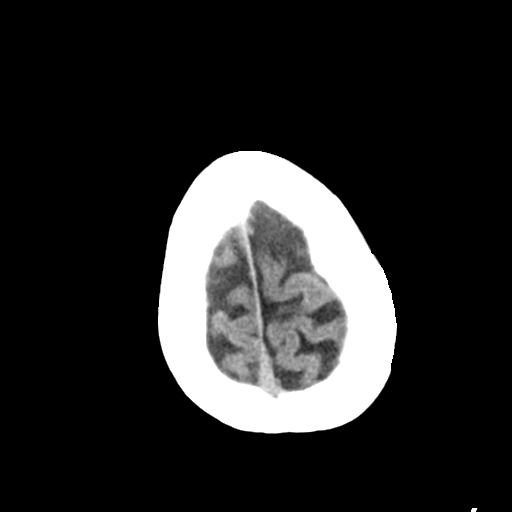
[im 28/34  bone]
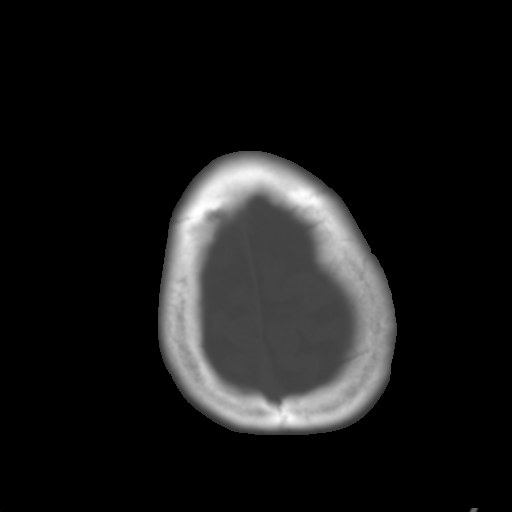
[im 31/34  brain]
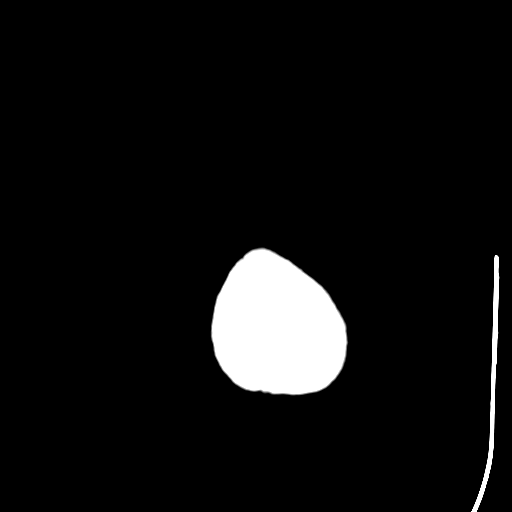

[Series 5: head 3.0 mpr cor · coronal · 0.33mm/px · 3 of 67 slices shown]
[im 23/67  brain]
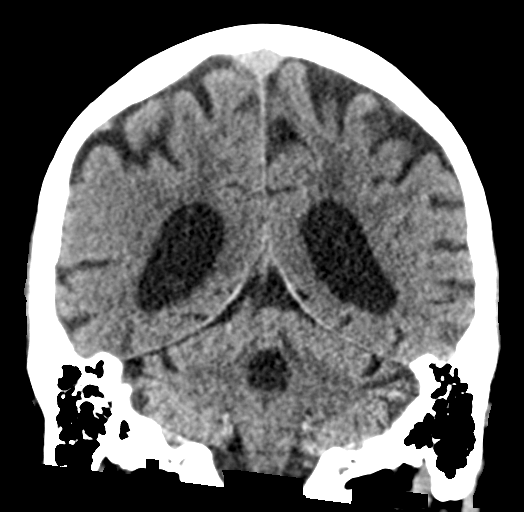
[im 30/67  brain]
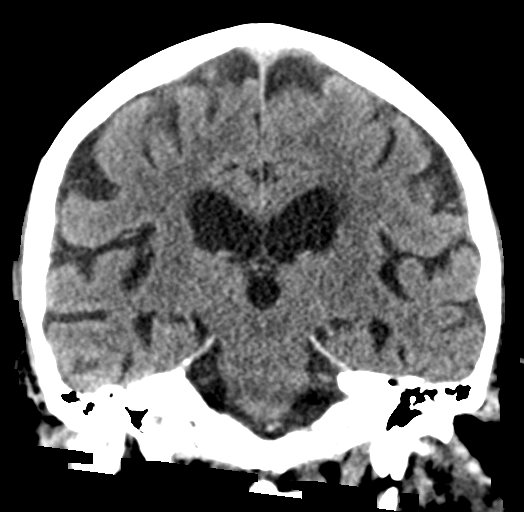
[im 37/67  brain]
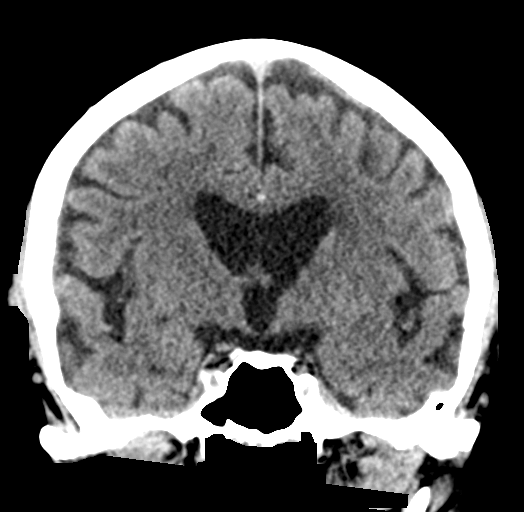

[Series 6: head 3.0 mpr sag · sagittal · 0.31mm/px · 3 of 56 slices shown]
[im 19/56  brain]
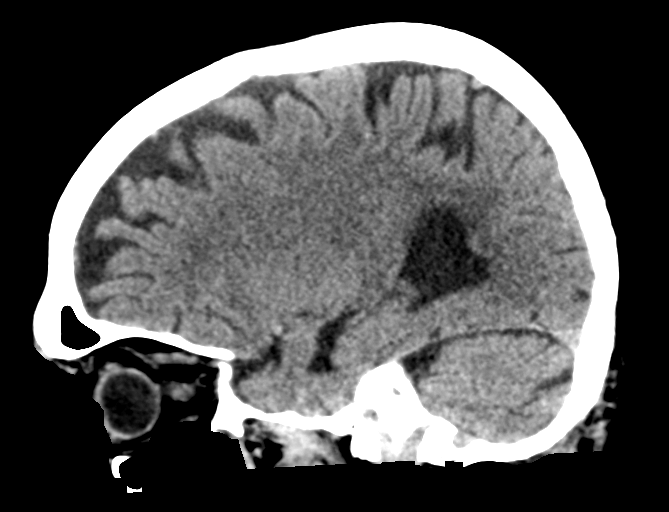
[im 28/56  brain]
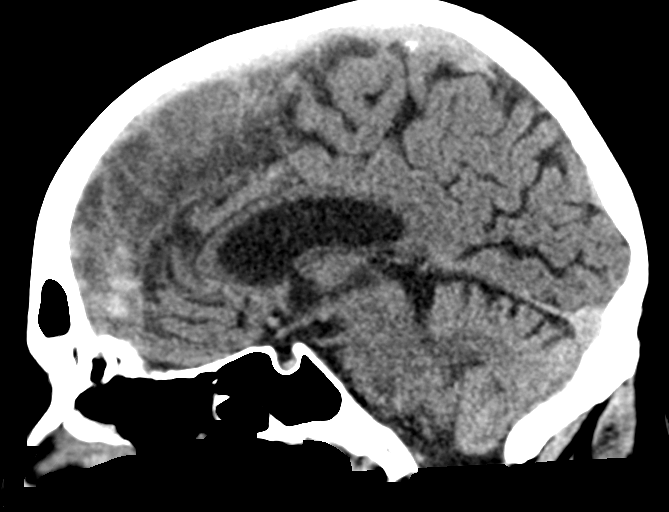
[im 37/56  brain]
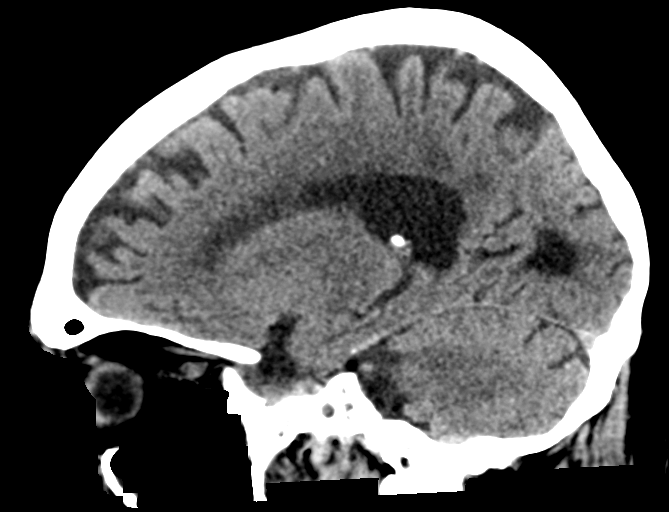

[16 of 47 positions shown; findings below may reference images not displayed]

FINDINGS: Brain: There is no mass, hemorrhage or extra-axial collection. There
is generalized atrophy without lobar predilection. Hypodensity of
the white matter is most commonly associated with chronic
microvascular disease.

Vascular: No abnormal hyperdensity of the major intracranial
arteries or dural venous sinuses. No intracranial atherosclerosis.

Skull: The visualized skull base, calvarium and extracranial soft
tissues are normal.

Sinuses/Orbits: No fluid levels or advanced mucosal thickening of
the visualized paranasal sinuses. No mastoid or middle ear effusion.
The orbits are normal.
IMPRESSION: Generalized atrophy and chronic microvascular ischemia without acute
intracranial abnormality.

## 2020-02-08 IMAGING — DX DG SHOULDER 2+V PORT*R*
1 series · 1 of 1 positions shown · non-contrast
Comparison: Earlier today

CLINICAL DATA: Post reduction right shoulder

EXAM:
PORTABLE RIGHT SHOULDER

[shoulder]
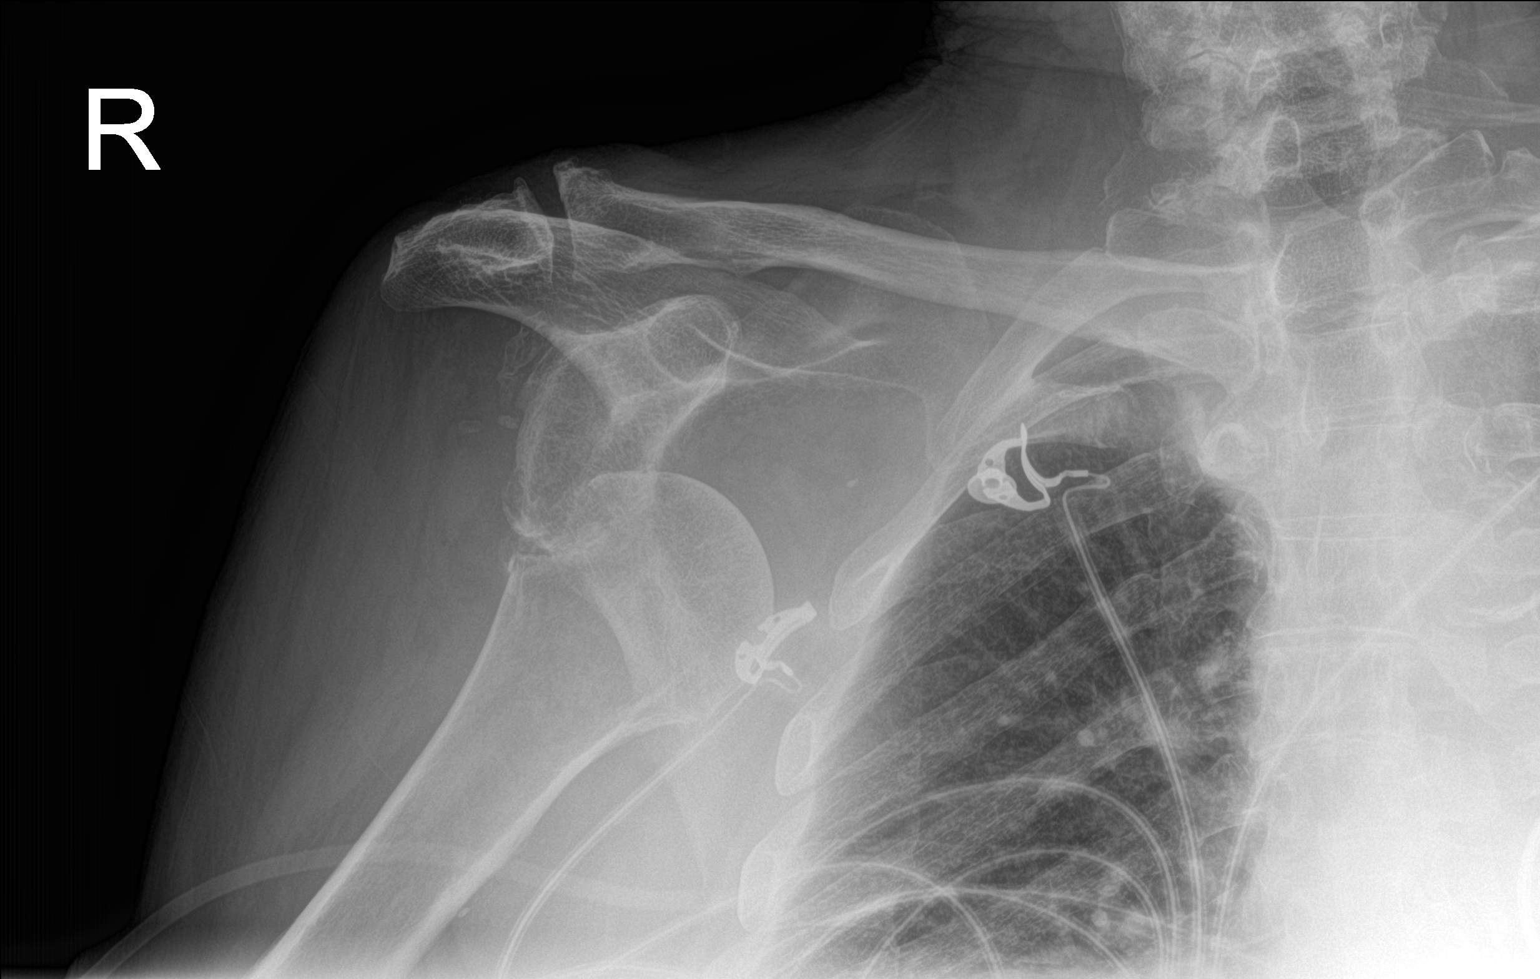

[1 of 1 positions shown; findings below may reference images not displayed]

FINDINGS: Continued anterior glenohumeral dislocation with Hill-Sachs
deformity. Degenerative spurring at the glenohumeral and
acromioclavicular joints. Negative right ribs and apical lung.
IMPRESSION: Persistent anterior glenohumeral dislocation.

## 2020-02-08 MED ORDER — MORPHINE SULFATE (PF) 4 MG/ML IV SOLN
4.0000 mg | Freq: Once | INTRAVENOUS | Status: DC
  Filled 2020-02-08: qty 1

## 2020-02-08 MED ORDER — ONDANSETRON HCL 4 MG/2ML IJ SOLN
4.0000 mg | Freq: Once | INTRAMUSCULAR | Status: AC
  Administered 2020-02-08: 4 mg via INTRAVENOUS
  Filled 2020-02-08: qty 2

## 2020-02-08 MED ORDER — PROPOFOL 10 MG/ML IV BOLUS
INTRAVENOUS | Status: AC | PRN
  Administered 2020-02-08: 10 mg via INTRAVENOUS
  Administered 2020-02-08: 20 mg via INTRAVENOUS
  Administered 2020-02-08 (×2): 10 mg via INTRAVENOUS
  Administered 2020-02-08: 20 mg via INTRAVENOUS
  Administered 2020-02-08: 10 mg via INTRAVENOUS

## 2020-02-08 MED ORDER — PROPOFOL 10 MG/ML IV BOLUS
0.5000 mg/kg | Freq: Once | INTRAVENOUS | Status: AC
  Administered 2020-02-08: 41.4 mg via INTRAVENOUS
  Filled 2020-02-08: qty 20

## 2020-02-08 MED ORDER — PROPOFOL 10 MG/ML IV BOLUS
INTRAVENOUS | Status: AC | PRN
  Administered 2020-02-08: 20 mg via INTRAVENOUS
  Administered 2020-02-08: 40 mg via INTRAVENOUS
  Administered 2020-02-08 (×2): 20 mg via INTRAVENOUS

## 2020-02-08 MED ORDER — MORPHINE SULFATE (PF) 4 MG/ML IV SOLN
4.0000 mg | Freq: Once | INTRAVENOUS | Status: AC
  Administered 2020-02-08: 4 mg via INTRAVENOUS
  Filled 2020-02-08: qty 1

## 2020-02-08 MED ORDER — PROPOFOL 10 MG/ML IV BOLUS
1.0000 mg/kg | Freq: Once | INTRAVENOUS | Status: AC
  Administered 2020-02-08: 82.8 mg via INTRAVENOUS
  Filled 2020-02-08: qty 20

## 2020-02-08 MED ORDER — LIDOCAINE HCL (PF) 1 % IJ SOLN
30.0000 mL | Freq: Once | INTRAMUSCULAR | Status: AC
  Administered 2020-02-08: 30 mL
  Filled 2020-02-08: qty 30

## 2020-02-08 MED ORDER — HYDROCODONE-ACETAMINOPHEN 5-325 MG PO TABS
1.0000 | ORAL_TABLET | ORAL | 0 refills | Status: DC | PRN
Start: 2020-02-08 — End: 2020-02-15

## 2020-02-08 NOTE — ED Triage Notes (Signed)
Pt from home; states fell at home and was down around three hours before being found by his wife, who gave pt two Norco 5 with no relief.  EMS report they gave 50 mcg of Fentanyl with no relief.  Pt guarding right shoulder on arrival.

## 2020-02-08 NOTE — Progress Notes (Signed)
Orthopedic Tech Progress Note Patient Details:  Glenn Hunt March 10, 1932 [id number]  Ortho Devices Type of Ortho Device: Shoulder abduction pillow Ortho Device/Splint Location: RUE Ortho Device/Splint Interventions: Ordered, Application, Adjustment   Post Interventions Patient Tolerated: Well Instructions Provided: Care of device   Janit Pagan 02/08/2020, 9:50 AM

## 2020-02-08 NOTE — Progress Notes (Signed)
Orthopedic Tech Progress Note Patient Details:  Glenn Hunt 02-25-1932 [id number]  Ortho Devices Type of Ortho Device: Sling immobilizer Ortho Device/Splint Interventions: Ordered, Delivered to patient       [name] 02/08/2020, 4:36 AM

## 2020-02-08 NOTE — Discharge Instructions (Signed)
Your imaging today shows fracture and dislocation of the shoulder.  Reduction was attempted by both the emergency team overnight as well as the orthopedics team and was unsuccessful.  They suspect he will need surgery.  Please follow-up with Dr. Victorino December in clinic tomorrow to discuss further management options.  Please fill a prescription for pain medicine and use if needed.  Please rest and keep your arm in the shoulder immobilizer.  If any symptoms change or worsen including new numbness, tingling, weakness of the arm, please return to the nearest emergency department.

## 2020-02-08 NOTE — ED Provider Notes (Signed)
7:31 AM Care assumed from Dr. Dina Rich.  At time of transfer care, patient is awaiting for recommendations from orthopedics in regards to if he is stable for discharge home for outpatient follow-up versus needing surgical intervention on his dislocated and fractured shoulder today.  Patient currently has a CT scan ordered as well as a Covid test.  He is remaining n.p.o.   Dr. Doran Durand with orthopedics just called back.  He reports that patient is able to go home after CT this morning and remain in the shoulder immobilizer and follow-up with Dr. Victorino December in clinic tomorrow morning.  Will update the patient of this after he comes back from CT imaging.  He will be discharged home with her pain medicine as he still has the dislocation and fracture.  10:04 AM CT was completed and patient will be discharged to follow-up with orthopedics tomorrow.  Patient will have prescription for pain medicine sent and given the significant pain.  Patient discharged in stable condition while waiting for orthopedic recommendations in clinic tomorrow for further management.  Clinical Impression: 1. Dislocation of right shoulder joint, initial encounter     Disposition: Discharge  Condition: Good  I have discussed the results, Dx and Tx plan with the pt(& family if present). He/she/they expressed understanding and agree(s) with the plan. Discharge instructions discussed at great length. Strict return precautions discussed and pt &/or family have verbalized understanding of the instructions. No further questions at time of discharge.    New Prescriptions   HYDROCODONE-ACETAMINOPHEN (NORCO/VICODIN) 5-325 MG TABLET    Take 1 tablet by mouth every 4 (four) hours as needed.    Follow Up: [name], MD [address] [place] [address] [place] [address] [phone]  In 1 day Please call to confirm your appointment time tomorrow with Dr. Victorino December with orthopedics for your shoulder injury.         Henrine Hayter, Gwenyth Allegra, MD 02/08/20 1006

## 2020-02-08 NOTE — Sedation Documentation (Signed)
10mg  proprofol given IVP by Dr. Dina Rich.

## 2020-02-08 NOTE — ED Notes (Addendum)
Pt had removed shoulder abduction pillow. Ortho techs at bedside replacing brace.  CT aware.  Called spouse, left voicemail.

## 2020-02-08 NOTE — Consult Note (Signed)
Reason for Consult:right shoulder dislocation Referring Physician: Dr. Guilford Shi. is an 86 y.o. male.  HPI:  86 y/o male with PMH of CAD fell overnight injuring his right shoulder.  He recently had a shoulder injection by Dr. Alvan Dame.  He c/o aching pain in the right shoulder.  He denies any previous surgery of the right shoulder.  He had an attempt at reduction by Dr. Dina Rich but the shoulder popped back out  One previous dislocation 15+ years ago.  Past Medical History:  Diagnosis Date  . CAD (coronary artery disease)    2007, inferior ischemia on nuclear scan, catheterization showed  60-70% ostial PDA, 90% distal RCA and a very large right coronary artery ( vision stent was placed 3.5 x 23 mm), 80-90% diagonal ( small vessel, wire could not be passed patient followed medically)  . 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 Cancer ([place])    kidney  . Dieulafoy lesion of stomach    incidence of bleeds in 2015 and 2017 , required blood transfusion as bleed lef to a 4.5 hemoglobin  . Gout   . H/O right nephrectomy   . History of blood transfusion   . Skin cancer    skin , squamos cell   . Varicose veins of legs    bialtera; "ive had them about 5 years now and theyve never been a problem"     Past Surgical History:  Procedure Laterality Date  . CATARACT EXTRACTION, BILATERAL  2017  . CORONARY ANGIOPLASTY WITH STENT PLACEMENT  2006  . ESOPHAGOGASTRODUODENOSCOPY  04/20/2012   Procedure: ESOPHAGOGASTRODUODENOSCOPY (EGD);  Surgeon: Wonda Horner, MD;  Location: [facility] ENDOSCOPY;  Service: Endoscopy;  Laterality: N/A;  . NEPHRECTOMY Right    REPORTS AVG CREATININE 1.4 FOR THE LAST 20  YEARS   . TOTAL KNEE ARTHROPLASTY Right 08/26/2018   Procedure: TOTAL KNEE ARTHROPLASTY;  Surgeon: Paralee Cancel, MD;  Location: [facility] ORS;  Service: Orthopedics;  Laterality: Right;  70 mins    FH:  Negative for relevant diagnoses  Social History:  reports that he has been smoking cigarettes. He has been smoking about 0.25 packs per day. He has never used smokeless tobacco. He reports current alcohol use of about 7.0 standard drinks of alcohol per week. He reports that he does not use drugs.  Allergies:  Allergies  Allergen Reactions  . Tetracyclines & Related Other (See Comments)    Infection of penis    Medications: I have reviewed the patient's current medications.  Results for orders placed or performed during the hospital encounter of 02/08/20 (from the past 48 hour(s))  CBC with Differential     Status: Abnormal   Collection Time: 02/08/20  2:53 AM  Result Value Ref Range   WBC 11.9 (H) 4.0 - 10.5 K/uL   RBC 3.87 (L) 4.22 - 5.81 MIL/uL   Hemoglobin 13.3 13.0 - 17.0 g/dL   HCT 39.5 39 - 52 %   MCV 102.1 (H) 80.0 - 100.0 fL   MCH 34.4 (H) 26.0 - 34.0 pg   MCHC 33.7 30.0 - 36.0 g/dL  RDW 14.8 11.5 - 15.5 %   Platelets 204 150 - 400 K/uL   nRBC 0.0 0.0 - 0.2 %   Neutrophils Relative % 87 %   Neutro Abs 10.4 (H) 1.7 - 7.7 K/uL   Lymphocytes Relative 5 %   Lymphs Abs 0.6 (L) 0.7 - 4.0 K/uL   Monocytes Relative 7 %   Monocytes Absolute 0.9 0.1 - 1.0 K/uL   Eosinophils Relative 0 %   Eosinophils Absolute 0.0 0.0 - 0.5 K/uL   Basophils Relative 0 %   Basophils Absolute 0.0 0.0 - 0.1 K/uL   Immature Granulocytes 1 %   Abs Immature Granulocytes 0.06 0.00 - 0.07 K/uL    Comment: Performed at [facility] [address], [place], [place] [address]  Basic metabolic panel     Status: Abnormal   Collection Time: 02/08/20  2:53 AM  Result Value Ref Range   Sodium 137 135 - 145 mmol/L   Potassium 3.6 3.5 - 5.1 mmol/L   Chloride 100 98 - 111 mmol/L   CO2 22 22 - 32 mmol/L    Glucose, Bld 144 (H) 70 - 99 mg/dL    Comment: Glucose reference range applies only to samples taken after fasting for at least 8 hours.   BUN 42 (H) 8 - 23 mg/dL   Creatinine, Ser 1.63 (H) 0.61 - 1.24 mg/dL   Calcium 10.2 8.9 - 10.3 mg/dL   GFR, Estimated 41 (L) >60 mL/min    Comment: (NOTE) Calculated using the CKD-EPI Creatinine Equation (2021)    Anion gap 15 5 - 15    Comment: Performed at [facility] [address] [place], [place] [address]  Ethanol     Status: Abnormal   Collection Time: 02/08/20  2:54 AM  Result Value Ref Range   Alcohol, Ethyl (B) 19 (H) <10 mg/dL    Comment: (NOTE) Lowest detectable limit for serum alcohol is 10 mg/dL.  For medical purposes only. Performed at [facility], [place] [address] [place], [place] [address]     DG Shoulder Right  Result Date: 02/08/2020 CLINICAL DATA:  Pain, fall EXAM: RIGHT SHOULDER - 2+ VIEW COMPARISON:  None. FINDINGS: Perched anterior shoulder dislocation with associated impaction fracture of the posterolateral right humeral head (Hill-Sachs lesion). Additional fragmentation posteriorly likely to reflect a displaced fracture fragment from the humerus. A discrete glenoid injury is not well visualized to suggest a bony Bankart. There is a background of moderate glenohumeral and acromioclavicular arthrosis. Diffuse soft tissue swelling of the shoulder included portions of the chest are unremarkable aside from some basilar atelectatic changes. Additional degenerative features noted in the incidentally included hand. Telemetry leads overlie the chest. IMPRESSION: 1. Perched anterior shoulder dislocation with associated impaction fracture of the posterolateral right humeral head (Hill-Sachs lesion). 2. Additional fragmentation posteriorly likely to reflect a displaced fracture fragment from the humerus. 3. A discrete glenoid injury is not well visualized to suggest a bony Bankart though should reimage following  relocation. Electronically Signed   By: Lovena Le M.D.   On: 02/08/2020 02:48   CT Head Wo Contrast  Result Date: 02/08/2020 CLINICAL DATA:  Fall EXAM: CT HEAD WITHOUT CONTRAST TECHNIQUE: Contiguous axial images were obtained from the base of the skull through the vertex without intravenous contrast. COMPARISON:  12/21/2019 FINDINGS: Brain: There is no mass, hemorrhage or extra-axial collection. There is generalized atrophy without lobar predilection. Hypodensity of the white matter is most commonly associated with chronic microvascular  disease. Vascular: No abnormal hyperdensity of the major intracranial arteries or dural venous sinuses. No intracranial atherosclerosis. Skull: The visualized skull base, calvarium and extracranial soft tissues are normal. Sinuses/Orbits: No fluid levels or advanced mucosal thickening of the visualized paranasal sinuses. No mastoid or middle ear effusion. The orbits are normal. IMPRESSION: Generalized atrophy and chronic microvascular ischemia without acute intracranial abnormality. Electronically Signed   By: Ulyses Jarred M.D.   On: 02/08/2020 03:44   DG Shoulder Right Portable  Result Date: 02/08/2020 CLINICAL DATA:  Post reduction right shoulder EXAM: PORTABLE RIGHT SHOULDER COMPARISON:  Earlier today FINDINGS: Continued anterior glenohumeral dislocation with Hill-Sachs deformity. Degenerative spurring at the glenohumeral and acromioclavicular joints. Negative right ribs and apical lung. IMPRESSION: Persistent anterior glenohumeral dislocation. Electronically Signed   By: Monte Fantasia M.D.   On: 02/08/2020 05:31    ROS:  No recent f/c/n/v/wt loss PE:  Blood pressure (!) 161/82, pulse 90, temperature 98.6 F (37 C), temperature source Oral, resp. rate (!) 22, height 5\' 6"  (1.676 m), weight 82.8 kg, SpO2 96 %.   Assessment/Plan: Right shoulder dislocation - xrays show a right shoulder dislocation.  I explained the nature of the injury to the patient and  his wife.  We will try another reduction attempt under sedation in the ER.  He understands the risks and benefits and would like to proceed.  Procedure:  After informed consent and adequate anesthesia, multiple reduction attempts were made without success.  The reduction was aborted.  We will get a CT scan of the right shoulder and plan for closed reduction v. Open reduction in the near future.  He and his wife understand the plan and agree.  Wylene Simmer 02/08/2020, 6:09 AM

## 2020-02-08 NOTE — ED Provider Notes (Signed)
[facility] EMERGENCY DEPARTMENT Provider Note   CSN: [id number] Arrival date & time: 02/08/20  0201     History Chief Complaint  Patient presents with  . Fall    Glenn Hunt. is a 86 y.o. male.  HPI     This is an 86 year old male with a history of coronary artery disease who presents with right shoulder pain.  Patient reports that he has some chronic right shoulder pain for which she sees Dr. Alvan Dame.  He had a steroid injection on Friday.  He states that that did not help much.  Tonight he had some drinks with a neighbor and went to bed around 7 PM.  He states that he woke up on the floor.  He does not recall falling out of bed.  He does not believe he hit his head.  He is not on blood thinners.  He states he is having significant pain in the right shoulder that is markedly increased from baseline.  He rates his pain at 10 out of 10.  He took 2 Norco and 800 mg of ibuprofen at home with no relief.  Denies neck pain, back pain, hip pain, chest pain, shortness of breath, headache.  Denies numbness or tingling in the hand.  He is right-handed.  Past Medical History:  Diagnosis Date  . CAD (coronary artery disease)    2007, inferior ischemia on nuclear scan, catheterization showed  60-70% ostial PDA, 90% distal RCA and a very large right coronary artery ( vision stent was placed 3.5 x 23 mm), 80-90% diagonal ( small vessel, wire could not be passed patient followed medically)  . 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 Cancer ([facility])    kidney  . Dieulafoy lesion of stomach    incidence of bleeds in 2015 and 2017 , required blood transfusion as bleed lef to a 4.5  hemoglobin  . Gout   . H/O right nephrectomy   . History of blood transfusion   . Skin cancer    skin , squamos cell   . Varicose veins of legs    bialtera; "ive had them about 5 years now and theyve never been a problem"     Patient Active Problem List   Diagnosis Date Noted  . Stiffness of right knee 09/01/2018  . S/P right TKA 08/26/2018  . Shoulder pain 01/01/2018  . Osteoarthritis of left knee 07/20/2017  . Osteoarthritis of knee 04/16/2017  . Pain in left knee 04/16/2017  . Upper GI bleed 04/20/2012  . Anemia 04/20/2012  . Chest pain on exertion 04/20/2012  . Hx of unilateral nephrectomy 04/20/2012  . CKD (chronic kidney disease) stage 3, GFR 30-59 ml/min (HCC) 04/20/2012  . Elevated troponin 04/20/2012  . CAD (coronary artery disease)   . External hemorrhoids 04/14/2011    Past Surgical History:  Procedure Laterality Date  . CATARACT EXTRACTION, BILATERAL  2017  . CORONARY ANGIOPLASTY WITH STENT PLACEMENT  2006  . ESOPHAGOGASTRODUODENOSCOPY  04/20/2012   Procedure: ESOPHAGOGASTRODUODENOSCOPY (EGD);  Surgeon: Wonda Horner, MD;  Location: [facility] ENDOSCOPY;  Service: Endoscopy;  Laterality: N/A;  .  NEPHRECTOMY Right    REPORTS AVG CREATININE 1.4 FOR THE LAST 20 YEARS   . TOTAL KNEE ARTHROPLASTY Right 08/26/2018   Procedure: TOTAL KNEE ARTHROPLASTY;  Surgeon: Paralee Cancel, MD;  Location: [facility] ORS;  Service: Orthopedics;  Laterality: Right;  70 mins       No family history on file.  Social History   Tobacco Use  . Smoking status: Current Every Day Smoker    Packs/day: 0.25    Types: Cigarettes  . Smokeless tobacco: Never Used  . Tobacco comment: 7 cigarettes a day fo the last 10 years   Substance Use Topics  . Alcohol use: Yes    Alcohol/week: 7.0 standard drinks    Types: 7 Glasses of wine per week    Comment: daily   . Drug use: No    Home Medications Prior to Admission medications   Medication Sig Start Date End Date Taking? Authorizing Provider   acetaminophen (TYLENOL) 500 MG tablet Take 1,000 mg by mouth every 8 (eight) hours as needed for moderate pain.   Yes [provider]  allopurinol (ZYLOPRIM) 300 MG tablet Take 300 mg by mouth daily.    Yes [provider]  ibuprofen (ADVIL) 200 MG tablet Take 600 mg by mouth every 8 (eight) hours as needed for moderate pain.   Yes [provider]  Multiple Vitamins-Minerals (MULTIVITAMIN ADULT PO) Take 1 tablet by mouth daily.   Yes [provider]  potassium chloride (KLOR-CON) 10 MEQ tablet Take 10 mEq by mouth 2 (two) times daily. 12/15/19  Yes [provider]  torsemide (DEMADEX) 10 MG tablet Take 5 mg by mouth daily as needed (fluid).   Yes [provider]  triamterene-hydrochlorothiazide (MAXZIDE-25) 37.5-25 MG tablet Take 1 tablet by mouth daily.    Yes [provider]    Allergies    Tetracyclines & related  Review of Systems   Review of Systems  Constitutional: Negative for fever.  Respiratory: Negative for shortness of breath.   Cardiovascular: Negative for chest pain.  Gastrointestinal: Negative for abdominal pain, nausea and vomiting.  Musculoskeletal:       Right shoulder pain  Neurological: Negative for seizures, syncope, weakness and headaches.  All other systems reviewed and are negative.   Physical Exam Updated Vital Signs BP (!) 152/73   Pulse 95   Temp 98.2 F (36.8 C) (Oral)   Resp 18   Ht 1.676 m (5\' 6" )   Wt 82.8 kg   SpO2 96%   BMI 29.46 kg/m   Physical Exam Vitals and nursing note reviewed.  Constitutional:      Appearance: He is well-developed. He is not ill-appearing.     Comments: ABCs intact  HENT:     Head: Normocephalic and atraumatic.     Nose: Nose normal.     Mouth/Throat:     Mouth: Mucous membranes are moist.  Eyes:     Pupils: Pupils are equal, round, and reactive to light.  Neck:     Comments: No midline tenderness palpation, step-off, deformity Cardiovascular:      Rate and Rhythm: Normal rate and regular rhythm.     Heart sounds: Normal heart sounds. No murmur heard.   Pulmonary:     Effort: Pulmonary effort is normal. No respiratory distress.     Breath sounds: Normal breath sounds. No wheezing.  Abdominal:     Palpations: Abdomen is soft.     Tenderness: There is no abdominal tenderness.  Musculoskeletal:  Cervical back: Neck supple.     Right lower leg: No edema.     Left lower leg: No edema.     Comments: Deformity noted to the right shoulder with anterior dislocation of the humeral head and absent glenoid fossa, 2+ distal radial pulse, no clavicular tenderness or deformity noted  Skin:    General: Skin is warm and dry.  Neurological:     Mental Status: He is alert and oriented to person, place, and time.  Psychiatric:        Mood and Affect: Mood normal.     ED Results / Procedures / Treatments   Labs (all labs ordered are listed, but only abnormal results are displayed) Labs Reviewed  CBC WITH DIFFERENTIAL/PLATELET - Abnormal; Notable for the following components:      Result Value   WBC 11.9 (*)    RBC 3.87 (*)    MCV 102.1 (*)    MCH 34.4 (*)    Neutro Abs 10.4 (*)    Lymphs Abs 0.6 (*)    All other components within normal limits  BASIC METABOLIC PANEL - Abnormal; Notable for the following components:   Glucose, Bld 144 (*)    BUN 42 (*)    Creatinine, Ser 1.63 (*)    GFR, Estimated 41 (*)    All other components within normal limits  ETHANOL - Abnormal; Notable for the following components:   Alcohol, Ethyl (B) 19 (*)    All other components within normal limits  RESPIRATORY PANEL BY RT PCR (FLU A&B, COVID)    EKG None  Radiology DG Shoulder Right  Result Date: 02/08/2020 CLINICAL DATA:  Pain, fall EXAM: RIGHT SHOULDER - 2+ VIEW COMPARISON:  None. FINDINGS: Perched anterior shoulder dislocation with associated impaction fracture of the posterolateral right humeral head (Hill-Sachs lesion). Additional  fragmentation posteriorly likely to reflect a displaced fracture fragment from the humerus. A discrete glenoid injury is not well visualized to suggest a bony Bankart. There is a background of moderate glenohumeral and acromioclavicular arthrosis. Diffuse soft tissue swelling of the shoulder included portions of the chest are unremarkable aside from some basilar atelectatic changes. Additional degenerative features noted in the incidentally included hand. Telemetry leads overlie the chest. IMPRESSION: 1. Perched anterior shoulder dislocation with associated impaction fracture of the posterolateral right humeral head (Hill-Sachs lesion). 2. Additional fragmentation posteriorly likely to reflect a displaced fracture fragment from the humerus. 3. A discrete glenoid injury is not well visualized to suggest a bony Bankart though should reimage following relocation. Electronically Signed   By: Lovena Le M.D.   On: 02/08/2020 02:48   CT Head Wo Contrast  Result Date: 02/08/2020 CLINICAL DATA:  Fall EXAM: CT HEAD WITHOUT CONTRAST TECHNIQUE: Contiguous axial images were obtained from the base of the skull through the vertex without intravenous contrast. COMPARISON:  12/21/2019 FINDINGS: Brain: There is no mass, hemorrhage or extra-axial collection. There is generalized atrophy without lobar predilection. Hypodensity of the white matter is most commonly associated with chronic microvascular disease. Vascular: No abnormal hyperdensity of the major intracranial arteries or dural venous sinuses. No intracranial atherosclerosis. Skull: The visualized skull base, calvarium and extracranial soft tissues are normal. Sinuses/Orbits: No fluid levels or advanced mucosal thickening of the visualized paranasal sinuses. No mastoid or middle ear effusion. The orbits are normal. IMPRESSION: Generalized atrophy and chronic microvascular ischemia without acute intracranial abnormality. Electronically Signed   By: Ulyses Jarred M.D.    On: 02/08/2020 03:44   DG Shoulder Right  Portable  Result Date: 02/08/2020 CLINICAL DATA:  Post reduction right shoulder EXAM: PORTABLE RIGHT SHOULDER COMPARISON:  Earlier today FINDINGS: Continued anterior glenohumeral dislocation with Hill-Sachs deformity. Degenerative spurring at the glenohumeral and acromioclavicular joints. Negative right ribs and apical lung. IMPRESSION: Persistent anterior glenohumeral dislocation. Electronically Signed   By: Monte Fantasia M.D.   On: 02/08/2020 05:31    Procedures .Sedation  Date/Time: 02/08/2020 4:11 AM Performed by: Merryl Hacker, MD Authorized by: Merryl Hacker, MD   Consent:    Consent obtained:  Written and verbal   Consent given by:  Patient and spouse   Risks discussed:  Allergic reaction, dysrhythmia, inadequate sedation and respiratory compromise necessitating ventilatory assistance and intubation   Alternatives discussed:  Analgesia without sedation and regional anesthesia Universal protocol:    Imaging studies available: yes     Immediately prior to procedure a time out was called: yes   Indications:    Procedure performed:  Dislocation reduction   Procedure necessitating sedation performed by:  Different physician Pre-sedation assessment:    Time since last food or drink:  1900   ASA classification: class 3 - patient with severe systemic disease     Neck mobility: normal     Mouth opening:  3 or more finger widths   Thyromental distance:  4 finger widths   Mallampati score:  II - soft palate, uvula, fauces visible   Pre-sedation assessments completed and reviewed: airway patency, cardiovascular function, hydration status, mental status, nausea/vomiting and pain level     Pre-sedation assessment completed:  02/08/2020 4:13 AM Immediate pre-procedure details:    Reassessment: Patient reassessed immediately prior to procedure     Reviewed: vital signs, relevant labs/tests and NPO status     Verified: emergency  equipment available, intubation equipment available, IV patency confirmed and oxygen available   Procedure details (see MAR for exact dosages):    Preoxygenation:  Nasal cannula   Sedation:  Propofol   Intended level of sedation: deep   Analgesia:  Morphine   Intra-procedure monitoring:  Continuous capnometry, blood pressure monitoring, frequent LOC assessments and continuous pulse oximetry   Intra-procedure events: none     Total Provider sedation time (minutes):  30 Post-procedure details:    Post-sedation assessment completed:  02/08/2020 7:35 AM   Attendance: Constant attendance by certified staff until patient recovered     Recovery: Patient returned to pre-procedure baseline     Patient is stable for discharge or admission: yes     Patient tolerance:  Tolerated well, no immediate complications   (including critical care time) CRITICAL CARE Performed by: Merryl Hacker   Total critical care time: 40 minutes  Critical care time was exclusive of separately billable procedures and treating other patients.  Critical care was necessary to treat or prevent imminent or life-threatening deterioration.  Critical care was time spent personally by me on the following activities: development of treatment plan with patient and/or surrogate as well as nursing, discussions with consultants, evaluation of patient's response to treatment, examination of patient, obtaining history from patient or surrogate, ordering and performing treatments and interventions, ordering and review of laboratory studies, ordering and review of radiographic studies, pulse oximetry and re-evaluation of patient's condition.  Medications Ordered in ED Medications  morphine 4 MG/ML injection 4 mg (4 mg Intravenous Given 02/08/20 0251)  ondansetron (ZOFRAN) injection 4 mg (4 mg Intravenous Given 02/08/20 0250)  lidocaine (PF) (XYLOCAINE) 1 % injection 30 mL (30 mLs Infiltration Given 02/08/20 0334)  propofol  (DIPRIVAN) 10 mg/mL bolus/IV push 82.8 mg (82.8 mg Intravenous Given by Other 02/08/20 0505)  propofol (DIPRIVAN) 10 mg/mL bolus/IV push (10 mg Intravenous Given 02/08/20 0519)  propofol (DIPRIVAN) 10 mg/mL bolus/IV push 41.4 mg (41.4 mg Intravenous Given 02/08/20 0608)  propofol (DIPRIVAN) 10 mg/mL bolus/IV push (20 mg Intravenous Given 02/08/20 6659)    ED Course  I have reviewed the triage vital signs and the nursing notes.  Pertinent labs & imaging results that were available during my care of the patient were reviewed by me and considered in my medical decision making (see chart for details).  Clinical Course as of Feb 07 734  Wed Feb 08, 2020  9357 Attempted reduction with analgesia and intra-articular lidocaine.  Unfortunately, patient with increased muscle contraction and at baseline has a resting tremor which inhibited relaxation.  He and his wife consented to sedation.   [CH]    Clinical Course User Index [CH] Norma Montemurro, Barbette Hair, MD   MDM Rules/Calculators/A&P                          Patient presented following a fall out of bed.  He is overall nontoxic and vital signs are reassuring.  He is only complaining of right shoulder pain.  He is not on any blood thinners and does not believe he hit his head but he is 86 years old.  For this reason will obtain head CT and a right shoulder film.  Basic lab work was also obtained.  He does report drinking alcohol which is likely why he does not remember falling out of bed.  Work-up notable for an alcohol level of 19.  No significant metabolic derangements.  X-ray shows an anterior shoulder dislocation with likely Hill-Sachs deformity and possible Bankart fracture.  Initially reduction was attempted with analgesia and intra-articular lidocaine; however, persistent muscle contraction inhibited reduction.  Patient was initially sedated and reduction was attempted by myself and my PA.  Patient was requiring escalating amounts of propofol.   Given his age, felt uncomfortable being both the sedating and reducing physician.  He received a total of 80 mg of propofol during the initial attempt.  He remained hemodynamically stable and talking during this time.  Spoke with Dr. Doran Durand, orthopedics.  Appreciate his consult.  He presented at the bedside.  Patient was resedated with attempts at reduction.  Unfortunately, Dr. Doran Durand was unable to reduce the patient at the bedside with appropriate sedation.  Patient was monitored closely and was back to his baseline post sedation.  Dr. Doran Durand recommending an abduction orthosis and a CT scan.  Patient will require surgery.  Unclear whether this will happen later today or he will follow-up as an outpatient.  Patient signed out to Dr. Sherry Ruffing. Final Clinical Impression(s) / ED Diagnoses Final diagnoses:  Dislocation of right shoulder joint, initial encounter    Rx / DC Orders ED Discharge Orders    None       Melessia Kaus, Barbette Hair, MD 02/08/20 [phone]

## 2020-02-08 NOTE — Sedation Documentation (Addendum)
10mg  propofol IVP given by Dr. Dina Rich. Total of 40mg  at this time.

## 2020-02-08 NOTE — ED Notes (Signed)
Off to xray

## 2020-02-08 NOTE — ED Notes (Signed)
Ortho tech made aware of need for shoulder abduction pillow.

## 2020-02-08 NOTE — ED Notes (Signed)
Off to CT

## 2020-02-08 NOTE — ED Notes (Signed)
Sling brought by ortho tech. Now awaiting for RT.

## 2020-02-08 NOTE — Sedation Documentation (Addendum)
10mg  propofol given IVP by Dr. Dina Rich at this time. Total of 70mg .

## 2020-02-08 NOTE — ED Notes (Signed)
Report given to [employer].

## 2020-02-08 NOTE — Sedation Documentation (Signed)
Dr. Doran Durand of ortho arrived at bedside.

## 2020-02-08 NOTE — ED Notes (Signed)
Back to room.

## 2020-02-08 NOTE — Sedation Documentation (Signed)
80mg  of propofol given IVP total by Dr. Dina Rich.

## 2020-02-08 NOTE — ED Notes (Signed)
Ortho tech and RT aware of pts need due to conscious sedation.

## 2020-02-13 ENCOUNTER — Other Ambulatory Visit: Payer: Self-pay

## 2020-02-13 ENCOUNTER — Other Ambulatory Visit (HOSPITAL_COMMUNITY)
Admission: RE | Admit: 2020-02-13 | Discharge: 2020-02-13 | Disposition: A | Discharge status: Home / Self Care | Payer: Medicare Other | Source: Ambulatory Visit | Attending: Orthopedic Surgery | Admitting: Orthopedic Surgery

## 2020-02-13 ENCOUNTER — Encounter (HOSPITAL_COMMUNITY): Payer: Self-pay

## 2020-02-13 ENCOUNTER — Encounter (HOSPITAL_COMMUNITY): Payer: Self-pay | Admitting: Orthopedic Surgery

## 2020-02-13 ENCOUNTER — Encounter (HOSPITAL_COMMUNITY)
Admission: RE | Admit: 2020-02-13 | Discharge: 2020-02-13 | Disposition: A | Discharge status: Home / Self Care | Payer: Medicare Other | Source: Ambulatory Visit | Attending: Orthopedic Surgery | Admitting: Orthopedic Surgery

## 2020-02-13 DIAGNOSIS — Z20822 Contact with and (suspected) exposure to covid-19: Secondary | ICD-10-CM | POA: Diagnosis not present

## 2020-02-13 DIAGNOSIS — Z01812 Encounter for preprocedural laboratory examination: Secondary | ICD-10-CM | POA: Diagnosis not present

## 2020-02-13 HISTORY — DX: Unspecified osteoarthritis, unspecified site: M19.90

## 2020-02-13 HISTORY — DX: Other specified abnormal findings of blood chemistry: R79.89

## 2020-02-13 HISTORY — DX: Essential (primary) hypertension: I10

## 2020-02-13 HISTORY — DX: Anemia, unspecified: D64.9

## 2020-02-13 HISTORY — DX: Reserved for concepts with insufficient information to code with codable children: IMO0002

## 2020-02-13 LAB — SARS CORONAVIRUS 2 (TAT 6-24 HRS): SARS Coronavirus 2: NEGATIVE

## 2020-02-13 LAB — SURGICAL PCR SCREEN
MRSA, PCR: NEGATIVE
Staphylococcus aureus: NEGATIVE

## 2020-02-13 NOTE — Patient Instructions (Signed)
DUE TO COVID-19 ONLY ONE VISITOR IS ALLOWED TO COME WITH YOU AND STAY IN THE WAITING ROOM ONLY DURING PRE OP AND PROCEDURE.   IF YOU WILL BE ADMITTED INTO THE HOSPITAL YOU ARE ALLOWED ONE SUPPORT PERSON DURING VISITATION HOURS ONLY (10AM -8PM)   . The support person may change daily. . The support person must pass our screening, gel in and out, and wear a mask at all times, including in the patient's room. . Patients must also wear a mask when staff or their support person are in the room.   COVID SWAB TESTING MUST BE COMPLETED ON:  Today, Nov. 15, 2021   [address] [place], [place] [address]  (Must self quarantine after testing. Follow instructions on handout.)       Your procedure is scheduled on:  Tuesday, Nov. 16, 2021    Report to [facility] Main  Entrance    Report to admitting at 12:15 PM   Call this number if you have problems the morning of surgery [phone]   Do not eat food :After Midnight.   May have liquids until 11:45 AM   day of surgery  CLEAR LIQUID DIET  Foods Allowed                                                                     Foods Excluded  Water, Black Coffee and tea, regular and decaf                             liquids that you cannot  Plain Jell-O in any flavor  (No red)                                           see through such as: Fruit ices (not with fruit pulp)                                     milk, soups, orange juice              Iced Popsicles (No red)                                    All solid food                                   Apple juices Sports drinks like Gatorade (No red) Lightly seasoned clear broth or consume(fat free) Sugar, honey syrup  Sample Menu Breakfast                                Lunch                                     Supper Cranberry  juice                    Beef broth                            Chicken broth Jell-O                                     Grape juice                            Apple juice Coffee or tea                        Jell-O                                      Popsicle                                                Coffee or tea                        Coffee or tea      Complete one Ensure drink the morning of surgery at       the day of surgery.   Oral Hygiene is also important to reduce your risk of infection.                                    Remember - BRUSH YOUR TEETH THE MORNING OF SURGERY WITH YOUR REGULAR TOOTHPASTE   Do NOT smoke after Midnight   Take these medicines the morning of surgery with A SIP OF WATER: Allopurinol                               You may not have any metal on your body including  jewelry, and body piercings             Do not wear lotions, powders, perfumes/cologne, or deodorant                           Men may shave face and neck.   Do not bring valuables to the hospital. [place].   Contacts, dentures or bridgework may not be worn into surgery.   Bring small overnight bag day of surgery.   Special Instructions: Bring a copy of your healthcare power of attorney and living will documents         the day of surgery if you haven't scanned them in before.              Please read over the following fact sheets you were given: IF YOU HAVE QUESTIONS ABOUT YOUR PRE OP INSTRUCTIONS PLEASE CALL [place]- Preparing for Total Shoulder Arthroplasty    Before surgery,  you can play an important role. Because skin is not sterile, your skin needs to be as free of germs as possible. You can reduce the number of germs on your skin by using the following products. . Benzoyl Peroxide Gel o Reduces the number of germs present on the skin o Applied twice a day to shoulder area starting two days before surgery    ==================================================================  Please follow these instructions carefully:  BENZOYL PEROXIDE 5% GEL  Please do  not use if you have an allergy to benzoyl peroxide.   If your skin becomes reddened/irritated stop using the benzoyl peroxide.  Starting two days before surgery, apply as follows:  (Today and repeat tonight) 1. Apply benzoyl peroxide in the morning and at night. Apply after taking a shower. If you are not taking a shower clean entire shoulder front, back, and side along with the armpit with a clean wet washcloth.  2. Place a quarter-sized dollop on your shoulder and rub in thoroughly, making sure to cover the front, back, and side of your shoulder, along with the armpit.   2 days before ____ AM   ____ PM              1 day before ____ AM   ____ PM                         3. Do this twice a day for two days.  (Last application is the night before surgery, AFTER using the CHG soap as described below).  4. Do NOT apply benzoyl peroxide gel on the day of surgery.   [place] - Preparing for Surgery Before surgery, you can play an important role.  Because skin is not sterile, your skin needs to be as free of germs as possible.  You can reduce the number of germs on your skin by washing with CHG (chlorahexidine gluconate) soap before surgery.  CHG is an antiseptic cleaner which kills germs and bonds with the skin to continue killing germs even after washing. Please DO NOT use if you have an allergy to CHG or antibacterial soaps.  If your skin becomes reddened/irritated stop using the CHG and inform your nurse when you arrive at Short Stay. Do not shave (including legs and underarms) for at least 48 hours prior to the first CHG shower.  You may shave your face/neck.  Please follow these instructions carefully:  1.  Shower with CHG Soap the night before surgery and the  morning of surgery.  2.  If you choose to wash your hair, wash your hair first as usual with your normal  shampoo.  3.  After you shampoo, rinse your hair and body thoroughly to remove the shampoo.                             4.  Use  CHG as you would any other liquid soap.  You can apply chg directly to the skin and wash.  Gently with a scrungie or clean washcloth.  5.  Apply the CHG Soap to your body ONLY FROM THE NECK DOWN.   Do   not use on face/ open                           Wound or open sores. Avoid contact with eyes, ears mouth and   genitals (private parts).  Wash face,  Genitals (private parts) with your normal soap.             6.  Wash thoroughly, paying special attention to the area where your    surgery  will be performed.  7.  Thoroughly rinse your body with warm water from the neck down.  8.  DO NOT shower/wash with your normal soap after using and rinsing off the CHG Soap.                9.  Pat yourself dry with a clean towel.            10.  Wear clean pajamas.            11.  Place clean sheets on your bed the night of your first shower and do not  sleep with pets. Day of Surgery : Do not apply any lotions/deodorants the morning of surgery.  Please wear clean clothes to the hospital/surgery center.  FAILURE TO FOLLOW THESE INSTRUCTIONS MAY RESULT IN THE CANCELLATION OF YOUR SURGERY  PATIENT SIGNATURE_________________________________  NURSE SIGNATURE__________________________________  ________________________________________________________________________

## 2020-02-13 NOTE — Progress Notes (Addendum)
COVID Vaccine Completed:Yes Date COVID Vaccine completed:05/06/19, 05/31/19, plus booster 9/21 COVID vaccine manufacturer: [employer]     PCP - Dr. Lavone Orn Cardiologist - has not seen in 25 years  Chest x-ray - 10/17/19 in epic EKG - 02/08/20 in epic Stress Test - greater than 2 years ECHO - unsure Cardiac Cath - greater than 2 years Pacemaker/ICD device last checked:N/A  Sleep Study - N/A CPAP -  N/A  Fasting Blood Sugar -  N/A Checks Blood Sugar __ N/A___ times a day  Blood Thinner Instructions: N/A Aspirin Instructions: N/A Last Dose: N/A  Anesthesia review: CAD  Patient denies shortness of breath, fever, cough and chest pain at PAT appointment   Patient verbalized understanding of instructions that were given to them at the PAT appointment. Patient was also instructed that they will need to review over the PAT instructions again at home

## 2020-02-13 NOTE — Patient Instructions (Signed)
DUE TO COVID-19 ONLY ONE VISITOR IS ALLOWED TO COME WITH YOU AND STAY IN THE WAITING ROOM ONLY DURING PRE OP AND PROCEDURE.   IF YOU WILL BE ADMITTED INTO THE HOSPITAL YOU ARE ALLOWED ONE SUPPORT PERSON DURING VISITATION HOURS ONLY (10AM -8PM)    The support person may change daily.  The support person must pass our screening, gel in and out, and wear a mask at all times, including in the patient's room.  Patients must also wear a mask when staff or their support person are in the room.              COVID SWAB TESTING MUST BE COMPLETED ON:  Today, Nov. 15, 2021 at 2:15 PM                         [address] [place], [place] [address]  (Must self quarantine after testing. Follow instructions on handout.)                  Your procedure is scheduled on:  Tuesday, Nov. 16, 2021               Report to [facility] Main  Entrance              Report to admitting at 12:15 PM              Call this number if you have problems the morning of surgery [phone]              Do not eat food :After Midnight.              May have liquids until 11:45 AM   day of surgery  CLEAR LIQUID DIET  Foods Allowed                                                                     Foods Excluded  Water, Black Coffee and tea, regular and decaf                             liquids that you cannot  Plain Jell-O in any flavor  (No red)                                           see through such as: Fruit ices (not with fruit pulp)                                     milk, soups, orange juice              Iced Popsicles (No red)                                           All solid food  Apple juices Sports drinks like Gatorade (No red) Lightly seasoned clear broth or consume(fat free) Sugar, honey syrup  Sample Menu Breakfast                                Lunch                                     Supper Cranberry juice                    Beef  broth                            Chicken broth Jell-O                                     Grape juice                           Apple juice Coffee or tea                        Jell-O                                      Popsicle                                                Coffee or tea                        Coffee or tea                            Complete one Ensure drink the morning of surgery at       the day of surgery.  Oral Hygieneis also important to reduce your risk of infection.                                   Remember - BRUSH YOUR TEETH THE MORNING OF SURGERY WITH YOUR REGULAR TOOTHPASTE              Do NOT smoke after Midnight              Take these medicines the morning of surgery with A SIP OF WATER: Allopurinol                               You may not have any metal on your body including  jewelry, and body piercings             Do not wear lotions, powders, perfumes/cologne, or deodorant                           Men may shave face and neck.  Do not bring valuables to the hospital. [place].              Contacts, dentures or bridgework may not be worn into surgery.              Bring small overnight bag day of surgery.              Special Instructions: Bring a copy of your healthcare power of attorney and living will documents         the day of surgery if you haven't scanned them in before.              Please read over the following fact sheets you were given: IF YOU HAVE QUESTIONS ABOUT YOUR PRE OP INSTRUCTIONS PLEASE CALL [place]- Preparing for Total Shoulder Arthroplasty   Before surgery, you can play an important role. Because skin is not sterile, your skin needs to be as free of germs as possible. You can reduce the number of germs on your skin by using the following products.  Benzoyl Peroxide Gel ? Reduces the number of germs present on the  skin ? Applied twice a day to shoulder area starting two days before surgery    ==================================================================  Please follow these instructions carefully:  BENZOYL PEROXIDE 5% GEL  Please do not use if you have an allergy to benzoyl peroxide.   If your skin becomes reddened/irritated stop using the benzoyl peroxide.  Starting two days before surgery, apply as follows:  (Today and repeat tonight) 1. Apply benzoyl peroxide in the morning and at night. Apply after taking a shower. If you are not taking a shower clean entire shoulder front, back, and side along with the armpit with a clean wet washcloth.  2. Place a quarter-sized dollop on your shoulder and rub in thoroughly, making sure to cover the front, back, and side of your shoulder, along with the armpit.   2 days before ____ AM   ____ PM              1 day before ____ AM   ____ PM                         3. Do this twice a day for two days.  (Last application is the night before surgery, AFTER using the CHG soap as described below).  4. Do NOT apply benzoyl peroxide gel on the day of surgery.   [place] - Preparing for Surgery Before surgery, you can play an important role.  Because skin is not sterile, your skin needs to be as free of germs as possible.  You can reduce the number of germs on your skin by washing with CHG (chlorahexidine gluconate) soap before surgery.  CHG is an antiseptic cleaner which kills germs and bonds with the skin to continue killing germs even after washing. Please DO NOT use if you have an allergy to CHG or antibacterial soaps.  If your skin becomes reddened/irritated stop using the CHG and inform your nurse when you arrive at Short Stay. Do not shave (including legs and underarms) for at least 48 hours prior to the first CHG shower.  You may shave your face/neck.  Please follow these instructions carefully:             1.  Shower  with CHG Soap the night  before surgery and the  morning of surgery.             2.  If you choose to wash your hair, wash your hair first as usual with your normal  shampoo.             3.  After you shampoo, rinse your hair and body thoroughly to remove the shampoo.                                                 4.  Use CHG as you would any other liquid soap.  You can apply chg directly to the skin and wash.  Gently with a scrungie or clean washcloth.             5.  Apply the CHG Soap to your body ONLY FROM THE NECK DOWN.   Do                not use on face/ open                           Wound or open sores. Avoid contact with eyes, ears mouth and                        genitals (private parts).                       Wash face,  Genitals (private parts) with your normal soap.             6.  Wash thoroughly, paying special attention to the area where your                                     surgery  will be performed.             7.  Thoroughly rinse your body with warm water from the neck down.             8.  DO NOT shower/wash with your normal soap after using and rinsing off the CHG Soap.                9.  Pat yourself dry with a clean towel.            10.  Wear clean pajamas.            11.  Place clean sheets on your bed the night of your first shower and do not  sleep with pets. Day of Surgery : Do not apply any lotions/deodorants the morning of surgery.  Please wear clean clothes to the hospital/surgery center.  FAILURE TO FOLLOW THESE INSTRUCTIONS MAY RESULT IN THE CANCELLATION OF YOUR SURGERY  PATIENT SIGNATURE_________________________________  NURSE SIGNATURE__________________________________  ________________________________________________________________________

## 2020-02-13 NOTE — Progress Notes (Signed)
COVID Vaccine Completed: Date COVID Vaccine completed: COVID vaccine manufacturer: [place]   PCP - Dr. Lavone Orn Cardiologist -   Chest x-ray - 10/17/19 in epic EKG - 02/08/20 in epic Stress Test -  ECHO -  Cardiac Cath -  Pacemaker/ICD device last checked:  Sleep Study -  CPAP -   Fasting Blood Sugar -  Checks Blood Sugar _____ times a day  Blood Thinner Instructions: Aspirin Instructions: Last Dose:  Anesthesia review: CAD  Patient denies shortness of breath, fever, cough and chest pain at PAT appointment   Patient verbalized understanding of instructions that were given to them at the PAT appointment. Patient was also instructed that they will need to review over the PAT instructions again at home before surgery.

## 2020-02-14 ENCOUNTER — Observation Stay (HOSPITAL_COMMUNITY): Payer: Medicare Other

## 2020-02-14 ENCOUNTER — Ambulatory Visit (HOSPITAL_COMMUNITY): Payer: Medicare Other | Admitting: Certified Registered Nurse Anesthetist

## 2020-02-14 ENCOUNTER — Encounter (HOSPITAL_COMMUNITY): Payer: Self-pay | Admitting: Orthopedic Surgery

## 2020-02-14 ENCOUNTER — Encounter (HOSPITAL_COMMUNITY)
Admission: RE | Disposition: A | Discharge status: Home / Self Care | Payer: Self-pay | Source: Home / Self Care | Attending: Orthopedic Surgery

## 2020-02-14 ENCOUNTER — Observation Stay (HOSPITAL_COMMUNITY)
Admission: RE | Admit: 2020-02-14 | Discharge: 2020-02-15 | Disposition: A | Discharge status: Home / Self Care | Payer: Medicare Other | Attending: Orthopedic Surgery | Admitting: Orthopedic Surgery

## 2020-02-14 ENCOUNTER — Other Ambulatory Visit: Payer: Self-pay

## 2020-02-14 DIAGNOSIS — Z96651 Presence of right artificial knee joint: Secondary | ICD-10-CM | POA: Insufficient documentation

## 2020-02-14 DIAGNOSIS — I2510 Atherosclerotic heart disease of native coronary artery without angina pectoris: Secondary | ICD-10-CM | POA: Diagnosis not present

## 2020-02-14 DIAGNOSIS — Z79899 Other long term (current) drug therapy: Secondary | ICD-10-CM | POA: Diagnosis not present

## 2020-02-14 DIAGNOSIS — W19XXXA Unspecified fall, initial encounter: Secondary | ICD-10-CM | POA: Insufficient documentation

## 2020-02-14 DIAGNOSIS — S42251A Displaced fracture of greater tuberosity of right humerus, initial encounter for closed fracture: Principal | ICD-10-CM | POA: Insufficient documentation

## 2020-02-14 DIAGNOSIS — F17210 Nicotine dependence, cigarettes, uncomplicated: Secondary | ICD-10-CM | POA: Insufficient documentation

## 2020-02-14 DIAGNOSIS — M25311 Other instability, right shoulder: Secondary | ICD-10-CM | POA: Diagnosis not present

## 2020-02-14 DIAGNOSIS — Z96611 Presence of right artificial shoulder joint: Secondary | ICD-10-CM

## 2020-02-14 DIAGNOSIS — Z85828 Personal history of other malignant neoplasm of skin: Secondary | ICD-10-CM | POA: Diagnosis not present

## 2020-02-14 DIAGNOSIS — I10 Essential (primary) hypertension: Secondary | ICD-10-CM | POA: Diagnosis not present

## 2020-02-14 DIAGNOSIS — S4991XA Unspecified injury of right shoulder and upper arm, initial encounter: Secondary | ICD-10-CM | POA: Diagnosis present

## 2020-02-14 HISTORY — DX: Malignant neoplasm of unspecified kidney, except renal pelvis: C64.9

## 2020-02-14 HISTORY — PX: REVERSE SHOULDER ARTHROPLASTY: SHX5054

## 2020-02-14 IMAGING — DX DG SHOULDER 2+V PORT*R*
1 series · 1 of 1 positions shown · non-contrast
Comparison: Radiographs [DATE]

CLINICAL DATA: Status post reversed shoulder arthroplasty.

EXAM:
PORTABLE RIGHT SHOULDER

[shoulder ap]
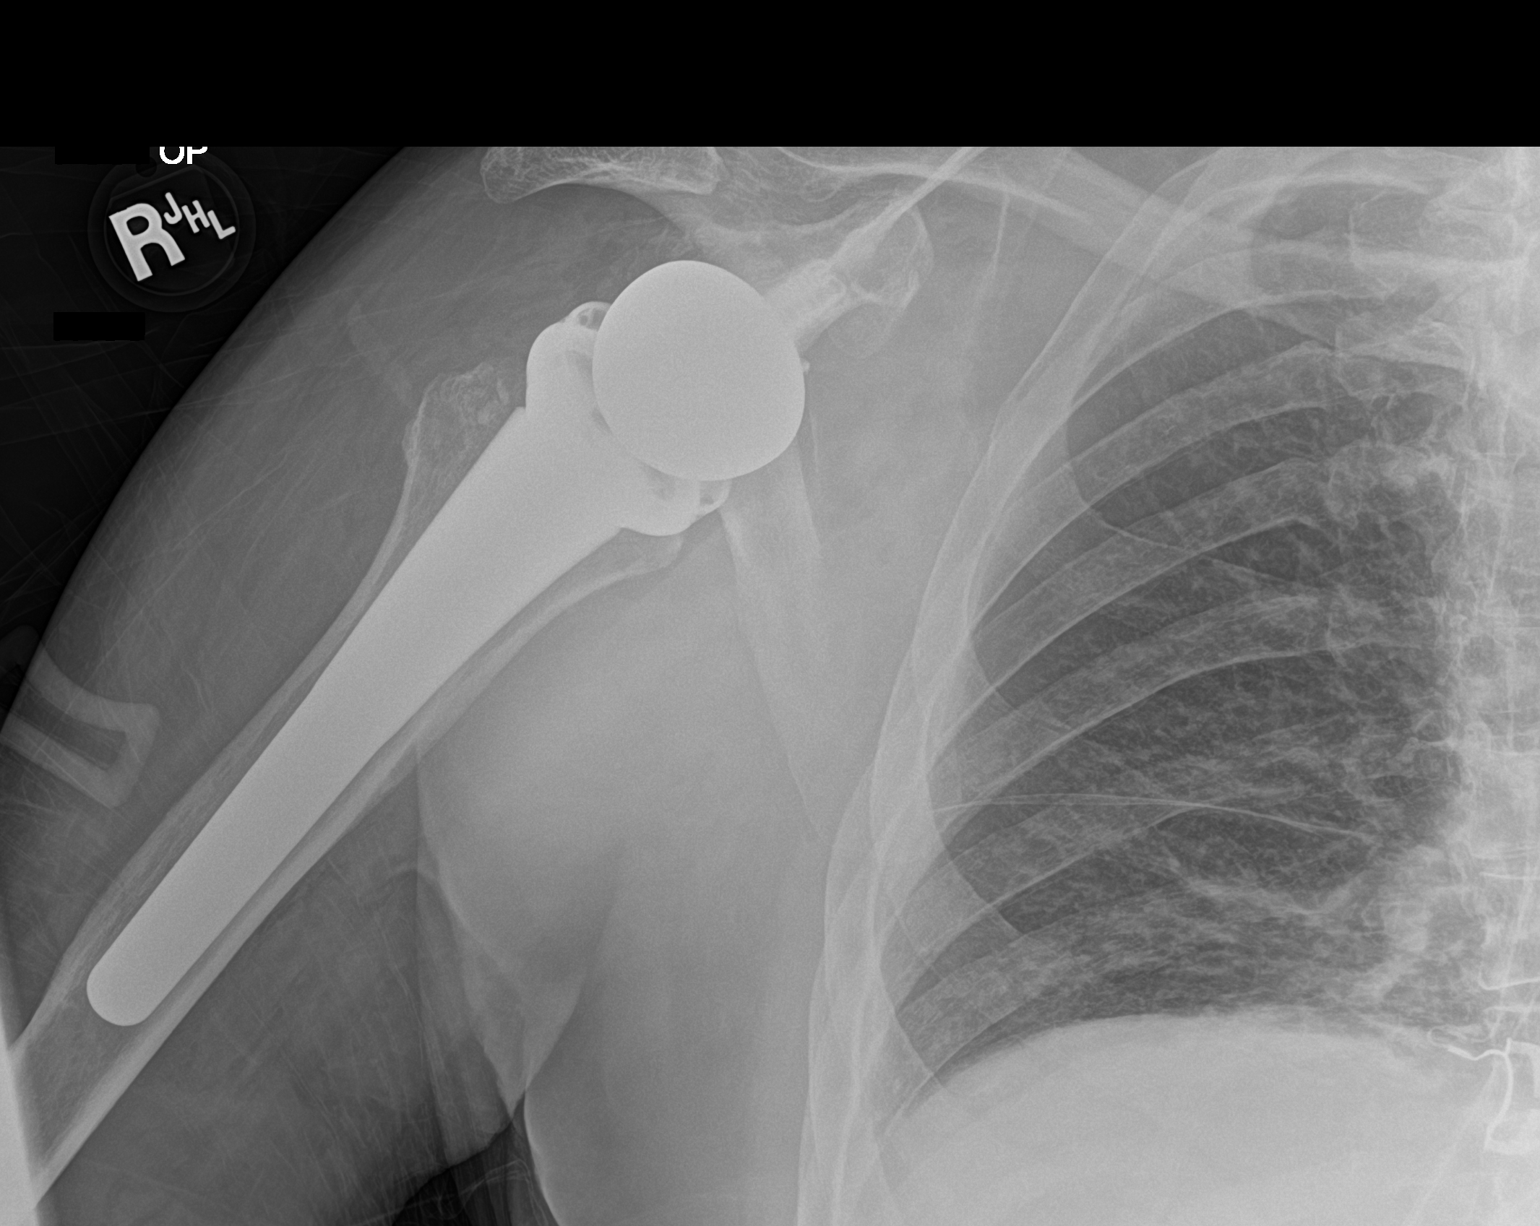

[1 of 1 positions shown; findings below may reference images not displayed]

FINDINGS: The humeral and glenoid components appear well seated. No
complicating features are identified.
IMPRESSION: Well seated components of a total shoulder arthroplasty.

## 2020-02-14 SURGERY — ARTHROPLASTY, SHOULDER, TOTAL, REVERSE
Anesthesia: General | Site: Shoulder | Laterality: Right

## 2020-02-14 MED ORDER — OXYCODONE HCL 5 MG PO TABS
5.0000 mg | ORAL_TABLET | ORAL | 0 refills | Status: AC | PRN
Start: 2020-02-14 — End: 2020-02-21

## 2020-02-14 MED ORDER — PHENYLEPHRINE 40 MCG/ML (10ML) SYRINGE FOR IV PUSH (FOR BLOOD PRESSURE SUPPORT)
PREFILLED_SYRINGE | INTRAVENOUS | Status: AC
  Filled 2020-02-14: qty 10

## 2020-02-14 MED ORDER — PHENYLEPHRINE HCL-NACL 10-0.9 MG/250ML-% IV SOLN
INTRAVENOUS | Status: AC
  Filled 2020-02-14: qty 250

## 2020-02-14 MED ORDER — ROCURONIUM BROMIDE 10 MG/ML (PF) SYRINGE
PREFILLED_SYRINGE | INTRAVENOUS | Status: AC
  Filled 2020-02-14: qty 10

## 2020-02-14 MED ORDER — MEPERIDINE HCL 50 MG/ML IJ SOLN: 6.2500 mg | INTRAMUSCULAR | Status: DC | PRN

## 2020-02-14 MED ORDER — ONDANSETRON HCL 4 MG/2ML IJ SOLN
INTRAMUSCULAR | Status: DC | PRN
  Administered 2020-02-14: 4 mg via INTRAVENOUS

## 2020-02-14 MED ORDER — CEFAZOLIN SODIUM-DEXTROSE 2-4 GM/100ML-% IV SOLN
INTRAVENOUS | Status: AC
  Filled 2020-02-14: qty 100

## 2020-02-14 MED ORDER — DEXAMETHASONE SODIUM PHOSPHATE 10 MG/ML IJ SOLN
INTRAMUSCULAR | Status: AC
  Filled 2020-02-14: qty 1

## 2020-02-14 MED ORDER — BUPIVACAINE-EPINEPHRINE (PF) 0.5% -1:200000 IJ SOLN
INTRAMUSCULAR | Status: DC | PRN
  Administered 2020-02-14: 15 mL via PERINEURAL

## 2020-02-14 MED ORDER — TORSEMIDE 10 MG PO TABS
5.0000 mg | ORAL_TABLET | Freq: Every day | ORAL | Status: DC | PRN
  Filled 2020-02-14: qty 0.5

## 2020-02-14 MED ORDER — ONDANSETRON HCL 4 MG/2ML IJ SOLN
INTRAMUSCULAR | Status: AC
  Filled 2020-02-14: qty 2

## 2020-02-14 MED ORDER — LIDOCAINE 2% (20 MG/ML) 5 ML SYRINGE
INTRAMUSCULAR | Status: AC
  Filled 2020-02-14: qty 5

## 2020-02-14 MED ORDER — MORPHINE SULFATE (PF) 4 MG/ML IV SOLN: 0.5000 mg | INTRAVENOUS | Status: DC | PRN

## 2020-02-14 MED ORDER — TRANEXAMIC ACID-NACL 1000-0.7 MG/100ML-% IV SOLN
1000.0000 mg | Freq: Once | INTRAVENOUS | Status: AC
  Administered 2020-02-14: 1000 mg via INTRAVENOUS
  Filled 2020-02-14: qty 100

## 2020-02-14 MED ORDER — FENTANYL CITRATE (PF) 100 MCG/2ML IJ SOLN
INTRAMUSCULAR | Status: AC
  Administered 2020-02-14: 100 ug via INTRAVENOUS
  Filled 2020-02-14: qty 2

## 2020-02-14 MED ORDER — ORAL CARE MOUTH RINSE: 15.0000 mL | Freq: Once | OROMUCOSAL | Status: AC

## 2020-02-14 MED ORDER — ONDANSETRON 4 MG PO TBDP: 4.0000 mg | ORAL_TABLET | Freq: Three times a day (TID) | ORAL | 0 refills | Status: AC | PRN

## 2020-02-14 MED ORDER — OXYCODONE HCL 5 MG/5ML PO SOLN: 5.0000 mg | Freq: Once | ORAL | Status: DC | PRN

## 2020-02-14 MED ORDER — ACETAMINOPHEN 325 MG PO TABS: 325.0000 mg | ORAL_TABLET | Freq: Four times a day (QID) | ORAL | Status: DC | PRN

## 2020-02-14 MED ORDER — LIDOCAINE 2% (20 MG/ML) 5 ML SYRINGE
INTRAMUSCULAR | Status: DC | PRN
  Administered 2020-02-14: 100 mg via INTRAVENOUS

## 2020-02-14 MED ORDER — CEFAZOLIN SODIUM-DEXTROSE 2-4 GM/100ML-% IV SOLN
2.0000 g | INTRAVENOUS | Status: AC
  Administered 2020-02-14: 2 g via INTRAVENOUS

## 2020-02-14 MED ORDER — ALLOPURINOL 300 MG PO TABS
300.0000 mg | ORAL_TABLET | Freq: Every day | ORAL | Status: DC
  Administered 2020-02-15: 300 mg via ORAL
  Filled 2020-02-14: qty 1

## 2020-02-14 MED ORDER — HYDROCODONE-ACETAMINOPHEN 7.5-325 MG PO TABS: 1.0000 | ORAL_TABLET | ORAL | Status: DC | PRN

## 2020-02-14 MED ORDER — PHENYLEPHRINE 40 MCG/ML (10ML) SYRINGE FOR IV PUSH (FOR BLOOD PRESSURE SUPPORT)
PREFILLED_SYRINGE | INTRAVENOUS | Status: DC | PRN
  Administered 2020-02-14: 80 ug via INTRAVENOUS

## 2020-02-14 MED ORDER — TRANEXAMIC ACID-NACL 1000-0.7 MG/100ML-% IV SOLN
INTRAVENOUS | Status: AC
  Filled 2020-02-14: qty 100

## 2020-02-14 MED ORDER — 0.9 % SODIUM CHLORIDE (POUR BTL) OPTIME
TOPICAL | Status: DC | PRN
  Administered 2020-02-14: 1000 mL

## 2020-02-14 MED ORDER — ONDANSETRON HCL 4 MG PO TABS: 4.0000 mg | ORAL_TABLET | Freq: Four times a day (QID) | ORAL | Status: DC | PRN

## 2020-02-14 MED ORDER — METOCLOPRAMIDE HCL 5 MG PO TABS: 5.0000 mg | ORAL_TABLET | Freq: Three times a day (TID) | ORAL | Status: DC | PRN

## 2020-02-14 MED ORDER — ONDANSETRON HCL 4 MG/2ML IJ SOLN: 4.0000 mg | Freq: Once | INTRAMUSCULAR | Status: DC | PRN

## 2020-02-14 MED ORDER — BUPIVACAINE LIPOSOME 1.3 % IJ SUSP
INTRAMUSCULAR | Status: DC | PRN
  Administered 2020-02-14: 10 mL via PERINEURAL

## 2020-02-14 MED ORDER — TRIAMTERENE-HCTZ 37.5-25 MG PO TABS
1.0000 | ORAL_TABLET | Freq: Every day | ORAL | Status: DC
  Administered 2020-02-15: 1 via ORAL
  Filled 2020-02-14: qty 1

## 2020-02-14 MED ORDER — OXYCODONE HCL 5 MG PO TABS: 5.0000 mg | ORAL_TABLET | Freq: Once | ORAL | Status: DC | PRN

## 2020-02-14 MED ORDER — ROCURONIUM BROMIDE 10 MG/ML (PF) SYRINGE
PREFILLED_SYRINGE | INTRAVENOUS | Status: DC | PRN
  Administered 2020-02-14: 70 mg via INTRAVENOUS
  Administered 2020-02-14: 20 mg via INTRAVENOUS

## 2020-02-14 MED ORDER — ACETAMINOPHEN 160 MG/5ML PO SOLN: 325.0000 mg | ORAL | Status: DC | PRN

## 2020-02-14 MED ORDER — DOCUSATE SODIUM 100 MG PO CAPS
100.0000 mg | ORAL_CAPSULE | Freq: Two times a day (BID) | ORAL | Status: DC
  Administered 2020-02-14 – 2020-02-15 (×2): 100 mg via ORAL
  Filled 2020-02-14 (×2): qty 1

## 2020-02-14 MED ORDER — POTASSIUM CHLORIDE CRYS ER 10 MEQ PO TBCR
10.0000 meq | EXTENDED_RELEASE_TABLET | Freq: Two times a day (BID) | ORAL | Status: DC
  Administered 2020-02-14 – 2020-02-15 (×2): 10 meq via ORAL
  Filled 2020-02-14 (×2): qty 1

## 2020-02-14 MED ORDER — CELECOXIB 200 MG PO CAPS
200.0000 mg | ORAL_CAPSULE | Freq: Two times a day (BID) | ORAL | Status: DC
  Administered 2020-02-14 – 2020-02-15 (×2): 200 mg via ORAL
  Filled 2020-02-14 (×2): qty 1

## 2020-02-14 MED ORDER — CHLORHEXIDINE GLUCONATE 0.12 % MT SOLN
15.0000 mL | Freq: Once | OROMUCOSAL | Status: AC
  Administered 2020-02-14: 15 mL via OROMUCOSAL

## 2020-02-14 MED ORDER — TRANEXAMIC ACID-NACL 1000-0.7 MG/100ML-% IV SOLN
1000.0000 mg | INTRAVENOUS | Status: AC
  Administered 2020-02-14: 1000 mg via INTRAVENOUS

## 2020-02-14 MED ORDER — PHENOL 1.4 % MT LIQD: 1.0000 | OROMUCOSAL | Status: DC | PRN

## 2020-02-14 MED ORDER — CEFAZOLIN SODIUM-DEXTROSE 1-4 GM/50ML-% IV SOLN
1.0000 g | Freq: Four times a day (QID) | INTRAVENOUS | Status: AC
  Administered 2020-02-14 – 2020-02-15 (×3): 1 g via INTRAVENOUS
  Filled 2020-02-14 (×3): qty 50

## 2020-02-14 MED ORDER — ONDANSETRON HCL 4 MG/2ML IJ SOLN: 4.0000 mg | Freq: Four times a day (QID) | INTRAMUSCULAR | Status: DC | PRN

## 2020-02-14 MED ORDER — ACETAMINOPHEN 500 MG PO TABS
500.0000 mg | ORAL_TABLET | Freq: Four times a day (QID) | ORAL | Status: DC
  Administered 2020-02-14 – 2020-02-15 (×3): 500 mg via ORAL
  Filled 2020-02-14 (×3): qty 1

## 2020-02-14 MED ORDER — SUGAMMADEX SODIUM 200 MG/2ML IV SOLN
INTRAVENOUS | Status: DC | PRN
  Administered 2020-02-14: 200 mg via INTRAVENOUS

## 2020-02-14 MED ORDER — VANCOMYCIN HCL 1000 MG IV SOLR
INTRAVENOUS | Status: DC | PRN
  Administered 2020-02-14: 1000 mg via TOPICAL

## 2020-02-14 MED ORDER — LACTATED RINGERS IV SOLN: INTRAVENOUS | Status: DC

## 2020-02-14 MED ORDER — STERILE WATER FOR IRRIGATION IR SOLN
Status: DC | PRN
  Administered 2020-02-14: 2000 mL

## 2020-02-14 MED ORDER — HYDROCODONE-ACETAMINOPHEN 5-325 MG PO TABS
1.0000 | ORAL_TABLET | ORAL | Status: DC | PRN
  Administered 2020-02-15: 2 via ORAL
  Administered 2020-02-15: 1 via ORAL
  Filled 2020-02-14: qty 2
  Filled 2020-02-14: qty 1

## 2020-02-14 MED ORDER — MENTHOL 3 MG MT LOZG: 1.0000 | LOZENGE | OROMUCOSAL | Status: DC | PRN

## 2020-02-14 MED ORDER — FENTANYL CITRATE (PF) 100 MCG/2ML IJ SOLN: 50.0000 ug | Freq: Once | INTRAMUSCULAR | Status: AC

## 2020-02-14 MED ORDER — PHENYLEPHRINE HCL-NACL 10-0.9 MG/250ML-% IV SOLN
INTRAVENOUS | Status: DC | PRN
  Administered 2020-02-14: 50 ug/min via INTRAVENOUS

## 2020-02-14 MED ORDER — ACETAMINOPHEN 325 MG PO TABS: 325.0000 mg | ORAL_TABLET | ORAL | Status: DC | PRN

## 2020-02-14 MED ORDER — METOCLOPRAMIDE HCL 5 MG/ML IJ SOLN: 5.0000 mg | Freq: Three times a day (TID) | INTRAMUSCULAR | Status: DC | PRN

## 2020-02-14 MED ORDER — PROPOFOL 10 MG/ML IV BOLUS
INTRAVENOUS | Status: DC | PRN
  Administered 2020-02-14: 150 mg via INTRAVENOUS

## 2020-02-14 MED ORDER — VANCOMYCIN HCL 1000 MG IV SOLR
INTRAVENOUS | Status: AC
  Filled 2020-02-14: qty 1000

## 2020-02-14 MED ORDER — DEXAMETHASONE SODIUM PHOSPHATE 4 MG/ML IJ SOLN
INTRAMUSCULAR | Status: DC | PRN
  Administered 2020-02-14: 5 mg via INTRAVENOUS

## 2020-02-14 MED ORDER — ASPIRIN EC 325 MG PO TBEC
325.0000 mg | DELAYED_RELEASE_TABLET | Freq: Every day | ORAL | Status: DC
  Administered 2020-02-15: 325 mg via ORAL
  Filled 2020-02-14: qty 1

## 2020-02-14 MED ORDER — FENTANYL CITRATE (PF) 100 MCG/2ML IJ SOLN: 25.0000 ug | INTRAMUSCULAR | Status: DC | PRN

## 2020-02-14 SURGICAL SUPPLY — 58 items
BAG ZIPLOCK 12X15 (MISCELLANEOUS) ×3 IMPLANT
BIT DRILL FLUTED 3.0 STRL (BIT) ×3 IMPLANT
BLADE SAG 18X100X1.27 (BLADE) ×3 IMPLANT
CHLORAPREP W/TINT 26 (MISCELLANEOUS) IMPLANT
CLOSURE WOUND 1/2 X4 (GAUZE/BANDAGES/DRESSINGS) ×1
COVER BACK TABLE 60X90IN (DRAPES) ×3 IMPLANT
COVER SURGICAL LIGHT HANDLE (MISCELLANEOUS) ×3 IMPLANT
COVER WAND RF STERILE (DRAPES) IMPLANT
CUP SUT UNIV REVERS 39 NEU (Shoulder) ×3 IMPLANT
DRAPE ORTHO SPLIT 77X108 STRL (DRAPES) ×6
DRAPE SURG 17X11 SM STRL (DRAPES) ×3 IMPLANT
DRAPE SURG ORHT 6 SPLT 77X108 (DRAPES) ×2 IMPLANT
DRAPE U-SHAPE 47X51 STRL (DRAPES) ×3 IMPLANT
DRSG AQUACEL AG ADV 3.5X 6 (GAUZE/BANDAGES/DRESSINGS) IMPLANT
DRSG AQUACEL AG ADV 3.5X10 (GAUZE/BANDAGES/DRESSINGS) ×3 IMPLANT
ELECT REM PT RETURN 15FT ADLT (MISCELLANEOUS) ×3 IMPLANT
FACESHIELD WRAPAROUND (MASK) ×3 IMPLANT
GLENOID UNI REV MOD 24 +2 LAT (Joint) ×3 IMPLANT
GLENOSPHERE 39+4 LAT/24 UNI RV (Joint) ×3 IMPLANT
GLOVE BIO SURGEON STRL SZ7.5 (GLOVE) ×12 IMPLANT
GLOVE BIOGEL PI IND STRL 8 (GLOVE) ×2 IMPLANT
GLOVE BIOGEL PI INDICATOR 8 (GLOVE) ×4
GOWN STRL REUS W/ TWL XL LVL3 (GOWN DISPOSABLE) ×2 IMPLANT
GOWN STRL REUS W/TWL XL LVL3 (GOWN DISPOSABLE) ×6
INSERT HUMERAL MED 39/ +3 (Shoulder) ×1 IMPLANT
INSERT MEDIUM HUMERAL 39/ +3 (Shoulder) ×3 IMPLANT
KIT BASIN OR (CUSTOM PROCEDURE TRAY) ×3 IMPLANT
KIT TURNOVER KIT A (KITS) IMPLANT
MANIFOLD NEPTUNE II (INSTRUMENTS) ×3 IMPLANT
NEEDLE TAPERED W/ NITINOL LOOP (MISCELLANEOUS) IMPLANT
NS IRRIG 1000ML POUR BTL (IV SOLUTION) ×3 IMPLANT
PACK SHOULDER (CUSTOM PROCEDURE TRAY) ×3 IMPLANT
PENCIL SMOKE EVACUATOR (MISCELLANEOUS) IMPLANT
PIN SET MODULAR GLENOID SYSTEM (PIN) ×3 IMPLANT
PROTECTOR NERVE ULNAR (MISCELLANEOUS) ×3 IMPLANT
RESTRAINT HEAD UNIVERSAL NS (MISCELLANEOUS) IMPLANT
SCREW CENTRAL MOD 30MM (Screw) ×3 IMPLANT
SCREW PERI LOCK 5.5X16 (Screw) ×3 IMPLANT
SCREW PERIPHERAL 5.5X20 LOCK (Screw) ×3 IMPLANT
SCREW PERIPHERAL 5.5X28 LOCK (Screw) ×6 IMPLANT
SLING ARM FOAM STRAP MED (SOFTGOODS) ×3 IMPLANT
SMARTMIX MINI TOWER (MISCELLANEOUS)
SPONGE LAP 4X18 RFD (DISPOSABLE) IMPLANT
STEM HUM UNIV REV SZ13 (Stem) ×3 IMPLANT
STRIP CLOSURE SKIN 1/2X4 (GAUZE/BANDAGES/DRESSINGS) ×2 IMPLANT
SUCTION FRAZIER HANDLE 10FR (MISCELLANEOUS) ×3
SUCTION TUBE FRAZIER 10FR DISP (MISCELLANEOUS) ×1 IMPLANT
SUT FIBERWIRE #2 38 T-5 BLUE (SUTURE)
SUT MON AB 3-0 SH 27 (SUTURE) ×3
SUT MON AB 3-0 SH27 (SUTURE) ×1 IMPLANT
SUT VIC AB 0 CT1 36 (SUTURE) ×3 IMPLANT
SUT VIC AB 2-0 CT1 27 (SUTURE) ×3
SUT VIC AB 2-0 CT1 TAPERPNT 27 (SUTURE) ×1 IMPLANT
SUTURE FIBERWR #2 38 T-5 BLUE (SUTURE) IMPLANT
SUTURE TAPE 1.3 40 TPR END (SUTURE) ×2 IMPLANT
SUTURETAPE 1.3 40 TPR END (SUTURE) ×6
TOWEL OR 17X26 10 PK STRL BLUE (TOWEL DISPOSABLE) ×3 IMPLANT
TOWER SMARTMIX MINI (MISCELLANEOUS) IMPLANT

## 2020-02-14 NOTE — Anesthesia Postprocedure Evaluation (Signed)
Anesthesia Post Note  Patient: Glenn Hunt.  Procedure(s) Performed: REVERSE SHOULDER ARTHROPLASTY (Right Shoulder)     Patient location during evaluation: PACU Anesthesia Type: General and Regional Level of consciousness: sedated Pain management: pain level controlled Vital Signs Assessment: post-procedure vital signs reviewed and stable Respiratory status: spontaneous breathing and respiratory function stable Cardiovascular status: stable Postop Assessment: no apparent nausea or vomiting Anesthetic complications: no   No complications documented.  Last Vitals:  Vitals:   02/14/20 1815 02/14/20 1837  BP: (!) 147/71 (!) 162/77  Pulse: 84 87  Resp: 16 15  Temp: 37.1 C   SpO2: 93% 94%    Last Pain:  Vitals:   02/14/20 1844  TempSrc:   PainSc: 0-No pain                 Merlinda Frederick

## 2020-02-14 NOTE — Anesthesia Preprocedure Evaluation (Signed)
Anesthesia Evaluation  Patient identified by MRN, date of birth, ID band Patient awake    Reviewed: Allergy & Precautions, NPO status , Patient's Chart, lab work & pertinent test results  Airway Mallampati: II  TM Distance: >3 FB Neck ROM: Full    Dental  (+) Caps, Dental Advisory Given   Pulmonary Current Smoker and Patient abstained from smoking.,    breath sounds clear to auscultation       Cardiovascular hypertension, Pt. on medications (-) angina+ CAD and + Cardiac Stents   Rhythm:Regular Rate:Normal  Very active: walks, bikes   Neuro/Psych negative neurological ROS     GI/Hepatic Neg liver ROS, GERD  Medicated and Controlled,  Endo/Other  negative endocrine ROS  Renal/GU Renal InsufficiencyRenal disease (creat 1.26)H/o renal cancer: nephrectomy     Musculoskeletal  (+) Arthritis , Osteoarthritis,    Abdominal   Peds  Hematology negative hematology ROS (+)   Anesthesia Other Findings   Reproductive/Obstetrics                             Anesthesia Physical  Anesthesia Plan  ASA: III  Anesthesia Plan: General   Post-op Pain Management: GA combined w/ Regional for post-op pain   Induction: Intravenous  PONV Risk Score and Plan: 1 and Treatment may vary due to age or medical condition, Ondansetron and Dexamethasone  Airway Management Planned: Oral ETT and LMA  Additional Equipment:   Intra-op Plan:   Post-operative Plan: Extubation in OR  Informed Consent: I have reviewed the patients History and Physical, chart, labs and discussed the procedure including the risks, benefits and alternatives for the proposed anesthesia with the patient or authorized representative who has indicated his/her understanding and acceptance.   Patient has DNR.  Discussed DNR with patient and Suspend DNR.   Dental advisory given  Plan Discussed with: CRNA, Surgeon and  Anesthesiologist  Anesthesia Plan Comments: (See PAT note 08/19/2018, Konrad Felix, PA-C )        Anesthesia Quick Evaluation

## 2020-02-14 NOTE — Op Note (Signed)
Date: 02/14/2020   PRE-OPERATIVE DIAGNOSIS:  1.  Right proximal humerus fracture, greater tuberosity; 2.  Right shoulder anterior instability   POST-OPERATIVE DIAGNOSIS:  Same   PROCEDURE:  1. REVERSE SHOULDER ARTHROPLASTY    SURGEON:  [name], MD   ASSISTANT: Jonelle Sidle, PA-C  Assistant attestation: PA Mcclung was utilized throughout the procedure for positioning the patient, placement of deep retractors for the approach, reduction of the dislocated humerus, implantation of implants as well as trialing.  He was also utilized for closure and placement of dressing and sling postoperatively.   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  Due to the comminution and displaced nature of the fracture as well as being locked in a dislocated position, We discussed moving forward with reverse shoulder arthroplasty for his injury. Thus we elected to proceed with reverse shoulder arthroplasty for the plan.  The risks benefits and alternatives were discussed with the patient preoperatively including but not limited to the risks of infection, bleeding, nerve injury, cardiopulmonary complications, the need for revision surgery, dislocation, brachial plexus palsy, incomplete relief of pain, among others, and the patient was willing to proceed. The patient did provided informed consent.   OPERATIVE IMPLANTS:  Arthrex reverse arthroplasty with size 13 stem 24 mm +2 baseplate with 30 mm central compression screw 39 mm +4 lateralized glenosphere Standard tray and +3 polyethylene on the humeral side.   OPERATIVE FINDINGS:   There was moderate bone loss along the large proximal and posterior humeral sided Hill-Sachs lesion with fracture propagation into the posterior  aspect of the greater tuberosity.  The humeral head was locked in an inferior anterior position.  There was no undue tension noted on the axillary nerve.  The glenoid was without fracture.  Also of note, there was complete absence of the superior rotator cuff as well as subscapularis.  There was some scar tissue noted anteriorly from previous subscapularis repair.  There was just a small amount of infraspinatus as well as teres minor intact.  There was concentric wear as well as superior wear noted of the glenoid.   OPERATIVE PROCEDURE: The patient was brought to the operating room and placed in the supine position. General anesthesia was administered. IV antibiotics were given. Time out was performed. The upper extremity was prepped and draped in usual sterile fashion. The patient was in a beachchair position. Deltopectoral approach was carried out. After dissection through skin and subcutaneous fat, the cephalic vein was identified with the deltopectoral interval.  This was mobilized and taken medially.  Deep retractors were placed.  We then entered the clavipectoral fascia.  At this time we noted that there was evidence of previous biceps tenodesis as well as superior rotator cuff repair.  The biceps was released due to tethering.  We then noted the Hill-Sachs lesion had eroded such that the bulk of the anterior inferior glenoid rim was incarcerated within the posterior superior humeral head and greater tuberosity fragments.  We then released to the residual subscapularis.  This was tagged and retracted medially.  Deep retractors were placed and we were able to then place a bone hook around the humeral neck.  A Cobb elevator was then placed medial to that along the articular surface and we were able to gently reduce the humeral head with longitudinal  traction.  At that time we were able to assess that there was significant fragmentation of the posterior aspect of the greater tuberosity, a large posterior  Hill-Sachs lesion.  The glenoid was intact without fracture.  We then made our neck cut at 135 degrees.   A humeral neck protector was placed in deep retractors were placed and attention was then turned to the glenoid.    We next turned to the glenoid.  Deep retractors were placed, and I resected the labrum as well as the residual long head of biceps, and then placed a guidepin into the center position on the glenoid, with slight inferior declination. I then reamed over the guidepin, and this created a small metaphyseal cancellus blush inferiorly, removing just the cartilage to the subchondral bone superiorly. The base plate was selected and impacted place, and then I secured it centrally with a nonlocking screw, and I had excellent purchase both inferiorly and superiorly. I placed a short locking screws on anterior aspect,  And posterior aspect.   I then turned my attention to the glenosphere, and impacted this into place, placing slight inferior offset.    The glenoid sphere was completely seated, and had engagement of the [facility] taper. I then turned my attention back to the humerus.    The 13 mm stem was seated to the appropriate height to allow approximate 5.6 cm from the top of the Pectoralis major tendon to the top of the glenosphere.  The stem was placed in 30 degrees of retroversion.   Once the stem was impacted into place we trialed poly liners.  Using the standard humeral metaglen,  The shoulder had excellent motion, and was stable.  The final poly was impacted and again showed good motion and stability.  Next, I irrigated the wounds copiously.    The axillary nerve was palpated at the end of implanting, and found to be in continuity and not under undo tension.   I then irrigated the shoulder copiously once more, repaired the deltopectoral interval with Vicryl followed by subcutaneous monocryl and then subcuticular monocryl with Steri-Strips and sterile gauze for the skin. The patient was  awakened and returned back in stable and satisfactory condition. There no complications and he tolerated the procedure well.  All counts were correct.  The patient awakened from general anesthesia with no complications and transferred to PACU in stable condition.   Postoperative Plan: Glenn Hunt. will remain in his sling until her regional block has worn off, but is ok to remove it for use with his walker and He may move the arm as tolerated with PROM and AROM without limitation and should not lift over 5 pounds.  The sling will be worn for sleep for 6 weeks. He will begin shoulder PT in 1 week from discharge.  He will be admitted for pain control and we will see him back in 2 weeks for a wound check.  He will take bid ASA for DVT ppx for 6 weeks

## 2020-02-14 NOTE — Progress Notes (Signed)
AssistedDr. Ambrose Pancoast with right, ultrasound guided, interscalene  block. Side rails up, monitors on throughout procedure. See vital signs in flow sheet. Tolerated Procedure well.

## 2020-02-14 NOTE — Brief Op Note (Signed)
02/14/2020  4:35 PM  PATIENT:  Glenn Hunt.  86 y.o. male  PRE-OPERATIVE DIAGNOSIS:  Right shoulder instability, dislocation  POST-OPERATIVE DIAGNOSIS:  Same  PROCEDURE:  REVERSE SHOULDER ARTHROPLASTY  Assistant:  Jonelle Sidle, PA-C.  SURGEON:  [name], MD  ANESTHESIA:   General  COMPLICATIONS: None

## 2020-02-14 NOTE — H&P (Signed)
ORTHOPAEDIC H and P  REQUESTING PHYSICIAN: [name], MD  PCP:  Lavone Orn, MD  Chief Complaint: Right shoulder pain  HPI: Glenn Hunt. is a 86 y.o. male who complains of right shoulder pain and dysfunction following a fall last week.  He sustained an anterior inferior shoulder dislocation.  This was locked in a large Hill-Sachs lesion.  He was evaluated by myself in the office and found to have the above injury.  He is indicated for reverse arthroplasty for definitive management.  He continues to moderate pain but no numbness or tingling.  Past Medical History:  Diagnosis Date  . Anemia   . Arthritis   . CAD (coronary artery disease)    2007, inferior ischemia on nuclear scan, catheterization showed  60-70% ostial PDA, 90% distal RCA and a very large right coronary artery ( vision stent was placed 3.5 x 23 mm), 80-90% diagonal ( small vessel, wire could not be passed patient followed medically)  . 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 Cancer of kidney ([place])   . Creatinine elevation   . Dieulafoy lesion of stomach    incidence of bleeds in 2015 and 2017 , required blood transfusion as bleed lef to a 4.5 hemoglobin  . Gout   . H/O right nephrectomy   . History of blood transfusion   . Hypertension   . Skin cancer    skin , squamous cell   . Tuberculosis 1962  . Varicose veins of legs    bialtera; "ive had them about 5 years now and theyve never been a problem"    Past Surgical History:  Procedure Laterality Date  . CATARACT EXTRACTION, BILATERAL  2017  . COLONOSCOPY    . CORONARY ANGIOPLASTY WITH STENT PLACEMENT  2006  . ESOPHAGOGASTRODUODENOSCOPY   04/20/2012   Procedure: ESOPHAGOGASTRODUODENOSCOPY (EGD);  Surgeon: Wonda Horner, MD;  Location: [facility];  Service: Endoscopy;  Laterality: N/A;  . NEPHRECTOMY Right    REPORTS AVG CREATININE 1.4 FOR THE LAST 20 YEARS   . TOTAL KNEE ARTHROPLASTY Right 08/26/2018   Procedure: TOTAL KNEE ARTHROPLASTY;  Surgeon: Paralee Cancel, MD;  Location: [facility] ORS;  Service: Orthopedics;  Laterality: Right;  70 mins   Social History   Socioeconomic History  . Marital status: Married    Spouse name: Not on file  . Number of children: Not on file  . Years of education: Not on file  . Highest education level: Not on file  Occupational History  . Not on file  Tobacco Use  . Smoking status: Current Every Day Smoker    Packs/day: 0.25    Types: Cigarettes  . Smokeless tobacco: Never Used  . Tobacco comment: 7 cigarettes a day fo the last 10 years   Vaping Use  . Vaping Use: Never used  Substance and Sexual Activity  . Alcohol use: Yes    Alcohol/week: 7.0 standard drinks    Types: 7 Glasses of wine per week    Comment: daily   . Drug use: No  . Sexual activity: Yes  Other Topics Concern  . Not  on file  Social History Narrative  . Not on file   Social Determinants of Health   Financial Resource Strain:   . Difficulty of Paying Living Expenses: Not on file  Food Insecurity:   . Worried About Charity fundraiser in the Last Year: Not on file  . Ran Out of Food in the Last Year: Not on file  Transportation Needs:   . Lack of Transportation (Medical): Not on file  . Lack of Transportation (Non-Medical): Not on file  Physical Activity:   . Days of Exercise per Week: Not on file  . Minutes of Exercise per Session: Not on file  Stress:   . Feeling of Stress : Not on file  Social Connections:   . Frequency of Communication with Friends and Family: Not on file  . Frequency of Social Gatherings with Friends and Family: Not on file  . Attends Religious Services: Not on file  . Active Member of  Clubs or Organizations: Not on file  . Attends Archivist Meetings: Not on file  . Marital Status: Not on file   No family history on file. Allergies  Allergen Reactions  . Tetracyclines & Related Other (See Comments)    Infection of penis   Prior to Admission medications   Medication Sig Start Date End Date Taking? Authorizing Provider  acetaminophen (TYLENOL) 500 MG tablet Take 1,000 mg by mouth every 8 (eight) hours as needed for moderate pain.    [provider]  allopurinol (ZYLOPRIM) 300 MG tablet Take 300 mg by mouth daily.     [provider]  HYDROcodone-acetaminophen (NORCO/VICODIN) 5-325 MG tablet Take 1 tablet by mouth every 4 (four) hours as needed. 02/08/20   Tegeler, Gwenyth Allegra, MD  ibuprofen (ADVIL) 200 MG tablet Take 600 mg by mouth every 8 (eight) hours as needed for moderate pain.    [provider]  Multiple Vitamins-Minerals (MULTIVITAMIN ADULT PO) Take 1 tablet by mouth daily.    [provider]  potassium chloride (KLOR-CON) 10 MEQ tablet Take 10 mEq by mouth 2 (two) times daily. 12/15/19   [provider]  torsemide (DEMADEX) 10 MG tablet Take 5 mg by mouth daily as needed (fluid).    [provider]  triamterene-hydrochlorothiazide (MAXZIDE-25) 37.5-25 MG tablet Take 1 tablet by mouth daily.     [provider]   No results found.  Positive ROS: All other systems have been reviewed and were otherwise negative with the exception of those mentioned in the HPI and as above.  Physical Exam: General: Alert, no acute distress Cardiovascular: No pedal edema Respiratory: No cyanosis, no use of accessory musculature GI: No organomegaly, abdomen is soft and non-tender Skin: No lesions in the area of chief complaint Neurologic: Sensation intact distally Psychiatric: Patient is competent for consent with normal mood and affect Lymphatic: No axillary or cervical lymphadenopathy  MUSCULOSKELETAL:   Right upper extremity is warm and well-perfused.  He has moderate pain about the shoulder girdle but is neurovascular intact throughout with no neurovascular deficits.  Assessment: Right shoulder anterior inferior instability Right shoulder rotator cuff arthropathy  Plan: -Our plan will be to proceed today with reverse shoulder arthroplasty to address both his engaging Hill-Sachs lesion with instability and concomitant rotator cuff pathology.  We again reviewed that in detail.  We have previously discussed that in the office.  -Risk and benefits detailed with Dr. Drue Flirt, all questions solicited and answered to his satisfaction.  He has provided informed consent.  -  We will plan for admission overnight for monitoring and occupational therapy.  Likely discharge home tomorrow.    [name], MD Cell [phone]    02/14/2020 12:01 PM

## 2020-02-14 NOTE — Transfer of Care (Signed)
Immediate Anesthesia Transfer of Care Note  Patient: Glenn Hunt.  Procedure(s) Performed: REVERSE SHOULDER ARTHROPLASTY (Right Shoulder)  Patient Location: PACU  Anesthesia Type:GA combined with regional for post-op pain  Level of Consciousness: awake, alert , oriented and patient cooperative  Airway & Oxygen Therapy: Patient Spontanous Breathing and Patient connected to face mask  Post-op Assessment: Report given to RN and Post -op Vital signs reviewed and stable  Post vital signs: Reviewed and stable  Last Vitals:  Vitals Value Taken Time  BP 153/79 02/14/20 1706  Temp    Pulse 97 02/14/20 1708  Resp 16 02/14/20 1708  SpO2 93 % 02/14/20 1708  Vitals shown include unvalidated device data.  Last Pain:  Vitals:   02/14/20 1318  TempSrc:   PainSc: 0-No pain         Complications: No complications documented.

## 2020-02-14 NOTE — Anesthesia Procedure Notes (Signed)
Anesthesia Regional Block: Femoral nerve block   Pre-Anesthetic Checklist: ,, timeout performed, Correct Patient, Correct Site, Correct Laterality, Correct Procedure, Correct Position, site marked, Risks and benefits discussed,  Surgical consent,  Pre-op evaluation,  At surgeon's request and post-op pain management  Laterality: Right  Prep: chloraprep       Needles:  Injection technique: Single-shot  Needle Type: Echogenic Stimulator Needle     Needle Length: 5cm  Needle Gauge: 22     Additional Needles:   Procedures:, nerve stimulator,,, ultrasound used (permanent image in chart),,,,   Nerve Stimulator or Paresthesia:  Response: hand, 0.45 mA,   Additional Responses:   Narrative:  Start time: 02/14/2020 1:10 PM End time: 02/14/2020 1:20 PM Injection made incrementally with aspirations every 5 mL.  Performed by: Personally  Anesthesiologist: Janeece Riggers, MD  Additional Notes: Functioning IV was confirmed and monitors were applied.  A 66mm 22ga Arrow echogenic stimulator needle was used. Sterile prep and drape,hand hygiene and sterile gloves were used. Ultrasound guidance: relevant anatomy identified, needle position confirmed, local anesthetic spread visualized around nerve(s)., vascular puncture avoided.  Image printed for medical record. Negative aspiration and negative test dose prior to incremental administration of local anesthetic. The patient tolerated the procedure well.

## 2020-02-14 NOTE — Anesthesia Procedure Notes (Signed)
Procedure Name: Intubation Date/Time: 02/14/2020 2:43 PM Performed by: Claudia Desanctis, CRNA Pre-anesthesia Checklist: Patient identified, Emergency Drugs available, Suction available and Patient being monitored Patient Re-evaluated:Patient Re-evaluated prior to induction Oxygen Delivery Method: Circle system utilized Preoxygenation: Pre-oxygenation with 100% oxygen Induction Type: IV induction Ventilation: Mask ventilation without difficulty Laryngoscope Size: 2 and Miller Grade View: Grade I Tube type: Oral Tube size: 7.5 mm Number of attempts: 1 Airway Equipment and Method: Stylet Placement Confirmation: ETT inserted through vocal cords under direct vision,  positive ETCO2 and breath sounds checked- equal and bilateral Secured at: 21 cm Tube secured with: Tape Dental Injury: Teeth and Oropharynx as per pre-operative assessment

## 2020-02-15 DIAGNOSIS — S42251A Displaced fracture of greater tuberosity of right humerus, initial encounter for closed fracture: Secondary | ICD-10-CM | POA: Diagnosis not present

## 2020-02-15 LAB — BASIC METABOLIC PANEL
Anion gap: 9 (ref 5–15)
BUN: 28 mg/dL — ABNORMAL HIGH (ref 8–23)
CO2: 24 mmol/L (ref 22–32)
Calcium: 9.4 mg/dL (ref 8.9–10.3)
Chloride: 102 mmol/L (ref 98–111)
Creatinine, Ser: 1.51 mg/dL — ABNORMAL HIGH (ref 0.61–1.24)
GFR, Estimated: 45 mL/min — ABNORMAL LOW (ref >60)
Glucose, Bld: 167 mg/dL — ABNORMAL HIGH (ref 70–99)
Potassium: 4 mmol/L (ref 3.5–5.1)
Sodium: 135 mmol/L (ref 135–145)

## 2020-02-15 LAB — HEMOGLOBIN AND HEMATOCRIT, BLOOD
HCT: 33.4 % — ABNORMAL LOW (ref 39.0–52.0)
Hemoglobin: 11.3 g/dL — ABNORMAL LOW (ref 13.0–17.0)

## 2020-02-15 NOTE — Discharge Instructions (Signed)
-  Maintain your sling at nighttime and for comfort.  You should also sleep, in your sling for the first 6 weeks following surgery.  Otherwise you can begin moving the arm as tolerated.  No lifting over 5 pounds.  -Maintain postoperative bandage until your follow-up appointment.  This is waterproof and you may leave this in place until that follow-up appointment.  You may also begin showering on postoperative day #1.  Do not submerge underwater.  -For pain relief as well as swelling control you should apply ice to the right shoulder for 20 to 30 minutes out of each hour that you are awake.  You should do this as often as possible around-the-clock.  -For mild to moderate pain use Tylenol and Advil around-the-clock.  For any breakthrough pain use oxycodone as necessary.  -You will return to see Dr. Stann Mainland in the office in 2 weeks for routine postoperative care. Call [facility] [phone] for appointments, questions, medication refills, or concerns.

## 2020-02-15 NOTE — Plan of Care (Signed)
Pt discharged home in stable condition 

## 2020-02-15 NOTE — Evaluation (Signed)
Occupational Therapy Evaluation Patient Details Name: Glenn Hunt. MRN: [id number] DOB: 03-02-1932 Today's Date: 02/15/2020    History of Present Illness Patient is a 86 year old male admitted 11/16 for R reverse total shoulder arthroplasty due to fall resulting in proximal humerus fracture and locked anterior inferior dislocation on 11/9. PMH includes R TKA, HTN, CAD   Clinical Impression   s/p shoulder replacement without functional use of right dominant upper extremity secondary to effects of surgery and interscalene block and shoulder precautions. Therapist provided education and instruction to patient in regards to exercises, precautions, positioning, donning upper extremity clothing and bathing while maintaining shoulder precautions, ice and edema management and donning/doffing sling. Patient verbalized understanding and demonstrated as needed. Patient needed assistance to donn shirt, underwear, pants, socks and shoes and provided with instruction on compensatory strategies to perform ADLs. Patient to follow up with MD for further therapy needs.      Follow Up Recommendations  Follow surgeons recommendation for DC plan and follow-up therapies    Equipment Recommendations  None recommended by OT       Precautions / Restrictions Precautions Precautions: Shoulder Type of Shoulder Precautions: AROM elbow wrist and hand ok, P/AROM sh FF 90, ABD 60, ER 30 Shoulder Interventions: Shoulder sling/immobilizer;For comfort (and while sleeping) Precaution Booklet Issued: Yes (comment) Required Braces or Orthoses: Sling Restrictions Weight Bearing Restrictions: Yes RUE Weight Bearing: Non weight bearing      Mobility Bed Mobility Overal bed mobility: Needs Assistance Bed Mobility: Supine to Sit     Supine to sit: Min assist     General bed mobility comments: for trunk control     Transfers Overall transfer level: Needs assistance Equipment used: None Transfers: Sit  to/from Stand Sit to Stand: Supervision         General transfer comment: min cues not to use R UE in transfers    Balance Overall balance assessment: Mild deficits observed, not formally tested                                         ADL either performed or assessed with clinical judgement   ADL Overall ADL's : Needs assistance/impaired Eating/Feeding: Set up;Sitting   Grooming: Set up;Sitting   Upper Body Bathing: Minimal assistance;Sitting   Lower Body Bathing: Minimal assistance;Sitting/lateral leans;Sit to/from stand   Upper Body Dressing : Minimal assistance;Cueing for compensatory techniques;Cueing for sequencing Upper Body Dressing Details (indicate cue type and reason): cues to thread R UE first, min A to pull sweater down in back Lower Body Dressing: Sit to/from stand;Sitting/lateral leans;Moderate assistance Lower Body Dressing Details (indicate cue type and reason): to pull up pants over R hip and assist with socks/shoes Toilet Transfer: Supervision/safety;Ambulation Toilet Transfer Details (indicate cue type and reason): supervision as patient reports feeling slightly light headed, BP taken in sitting 118/64 Toileting- Clothing Manipulation and Hygiene: Minimal assistance;Sitting/lateral lean;Sit to/from stand       Functional mobility during ADLs: Supervision/safety General ADL Comments: patient educated in compensatory strategies for ADLs in order to maintain shoulder precautions      Vision Baseline Vision/History: Wears glasses              Pertinent Vitals/Pain Pain Assessment: 0-10 Pain Score: 7  Pain Location: R shoulder Pain Descriptors / Indicators: Sore;Aching Pain Intervention(s): Patient requesting pain meds-RN notified     Hand Dominance Right   Extremity/Trunk  Assessment Upper Extremity Assessment Upper Extremity Assessment: RUE deficits/detail RUE Deficits / Details: + nerve block   Lower Extremity  Assessment Lower Extremity Assessment: Overall WFL for tasks assessed       Communication Communication Communication: HOH;Other (comment) (hearing aides)   Cognition Arousal/Alertness: Awake/alert Behavior During Therapy: WFL for tasks assessed/performed Overall Cognitive Status: Within Functional Limits for tasks assessed                                        Exercises Exercises: Shoulder   Shoulder Instructions Shoulder Instructions Donning/doffing shirt without moving shoulder: Minimal assistance;Patient able to independently direct caregiver Method for sponge bathing under operated UE: Minimal assistance;Patient able to independently direct caregiver Donning/doffing sling/immobilizer: Moderate assistance;Patient able to independently direct caregiver Correct positioning of sling/immobilizer: Supervision/safety;Patient able to independently direct caregiver Pendulum exercises (written home exercise program):  (N/A) ROM for elbow, wrist and digits of operated UE: Supervision/safety;Patient able to independently direct caregiver Sling wearing schedule (on at all times/off for ADL's): Independent;Patient able to independently direct caregiver Proper positioning of operated UE when showering: Minimal assistance;Patient able to independently direct caregiver Positioning of UE while sleeping: Patient able to independently direct caregiver;Moderate assistance    Home Living Family/patient expects to be discharged to:: Private residence Living Arrangements: Spouse/significant other Available Help at Discharge: Family Type of Home: House (townhouse) Home Access: Stairs to enter [employer] of Steps: 2 steps down in garage then 3 steps into house   Home Layout: Two level;Able to live on main level with bedroom/bathroom     Bathroom Shower/Tub: Occupational psychologist: Handicapped height     Home Equipment: Shower seat - built in;Grab bars -  tub/shower          Prior Functioning/Environment Level of Independence: Independent                 OT Problem List: Pain;Impaired UE functional use         OT Goals(Current goals can be found in the care plan section) Acute Rehab OT Goals Patient Stated Goal: go home OT Goal Formulation: With patient   AM-PAC OT "6 Clicks" Daily Activity     Outcome Measure Help from another person eating meals?: A Little Help from another person taking care of personal grooming?: A Little Help from another person toileting, which includes using toliet, bedpan, or urinal?: A Little Help from another person bathing (including washing, rinsing, drying)?: A Little Help from another person to put on and taking off regular upper body clothing?: A Little Help from another person to put on and taking off regular lower body clothing?: A Lot 6 Click Score: 17   End of Session Equipment Utilized During Treatment: Other (comment) (sling) Nurse Communication: Mobility status  Activity Tolerance: Patient tolerated treatment well Patient left: in chair;with call bell/phone within reach;with chair alarm set;with family/visitor present  OT Visit Diagnosis: Pain Pain - Right/Left: Right Pain - part of body: Shoulder                Time: 7106-2694 OT Time Calculation (min): 45 min Charges:  OT General Charges $OT Visit: 1 Visit OT Evaluation $OT Eval Low Complexity: 1 Low OT Treatments $Self Care/Home Management : 23-37 mins  Glenn Hunt OT OT pager: [place] 02/15/2020, 8:47 AM

## 2020-02-15 NOTE — Discharge Summary (Signed)
Patient ID: Glenn Hunt. MRN: [id number] DOB/AGE: [birth year] 84 y.o.  Admit date: 02/14/2020 Discharge date:   Primary Diagnosis: Right shoulder dislocation, rotator cuff arthropathy Admission Diagnoses: Right shoulder dislocation, rotator cuff arthropathy , s/p right reverse total shoulder arthoplasty. Past Medical History:  Diagnosis Date  . Anemia   . Arthritis   . CAD (coronary artery disease)    2007, inferior ischemia on nuclear scan, catheterization showed  60-70% ostial PDA, 90% distal RCA and a very large right coronary artery ( vision stent was placed 3.5 x 23 mm), 80-90% diagonal ( small vessel, wire could not be passed patient followed medically)  . 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 Cancer of kidney ([place])   . Creatinine elevation   . Dieulafoy lesion of stomach    incidence of bleeds in 2015 and 2017 , required blood transfusion as bleed lef to a 4.5 hemoglobin  . Gout   . H/O right nephrectomy   . History of blood transfusion   . Hypertension   . Skin cancer    skin , squamous cell   . Tuberculosis 1962  . Varicose veins of legs    bialtera; "ive had them about 5 years now and theyve never been a problem"    Discharge Diagnoses:   Active Problems:   S/P reverse total shoulder arthroplasty, right  Estimated body mass index is 28.25 kg/m as calculated from the following:   Height as of this encounter: 5\' 6"  (1.676 m).   Weight as of this encounter: 79.4 kg.  Procedure:  Procedure(s) (LRB): REVERSE SHOULDER ARTHROPLASTY (Right)   Consults: None  HPI: Glenn Hunt is a 86 year old male presenting to the [facility] for a right reverse shoulder  arthroplasty following persistent right shoulder dislocation and rotator cuff arthropathy. He was seen in the ER on 02/08/20 for an irreducible right shoulder dislocation and followed up outpatient with Dr. Stann Mainland at [facility] for consultation and planning of right shoulder reverse total shoulder arthoplasty. He under went a right RTSA on 02/14/20 and was admitted overnight for observation.  Laboratory Data: Admission on 02/14/2020  Component Date Value Ref Range Status  . Sodium 02/15/2020 135  135 - 145 mmol/L Final  . Potassium 02/15/2020 4.0  3.5 - 5.1 mmol/L Final  . Chloride 02/15/2020 102  98 - 111 mmol/L Final  . CO2 02/15/2020 24  22 - 32 mmol/L Final  . Glucose, Bld 02/15/2020 167* 70 - 99 mg/dL Final   Glucose reference range applies only to samples taken after fasting for at least 8 hours.  . BUN 02/15/2020 28* 8 - 23 mg/dL Final  . Creatinine, Ser 02/15/2020 1.51* 0.61 - 1.24 mg/dL Final  . Calcium 02/15/2020 9.4  8.9 - 10.3 mg/dL Final  . GFR, Estimated 02/15/2020 45* >60 mL/min Final   Comment: (NOTE) Calculated using the CKD-EPI Creatinine Equation (2021)   . Anion gap 02/15/2020 9  5 - 15 Final   Performed at [facility], [place] [address], [place], [place] [address]  . Hemoglobin 02/15/2020 11.3* 13.0 - 17.0 g/dL Final  . HCT  02/15/2020 33.4* 39 - 52 % Final   Performed at [facility], [place] [address] [place], [place] [address]  Hospital Outpatient Visit on 02/13/2020  Component Date Value Ref Range Status  . SARS Coronavirus 2 02/13/2020 NEGATIVE  NEGATIVE Final   Comment: (NOTE) SARS-CoV-2 target nucleic acids are NOT DETECTED.  The SARS-CoV-2 RNA is generally detectable in upper and lower respiratory specimens during the acute phase of infection. Negative results do not preclude SARS-CoV-2 infection, do not rule out co-infections with other pathogens, and should not be used as the sole basis for treatment or other patient  management decisions. Negative results must be combined with clinical observations, patient history, and epidemiological information. The expected result is Negative.  Fact Sheet for Patients: SugarRoll.be  Fact Sheet for Healthcare Providers: https://www.woods-mathews.com/  This test is not yet approved or cleared by the [place] FDA and  has been authorized for detection and/or diagnosis of SARS-CoV-2 by FDA under an Emergency Use Authorization (EUA). This EUA will remain  in effect (meaning this test can be used) for the duration of the COVID-19 declaration under Se                          ction 564(b)(1) of the Act, 21 U.S.C. section 360bbb-3(b)(1), unless the authorization is terminated or revoked sooner.  Performed at [facility], [place] [address] [place], [place] [address]   Hospital Outpatient Visit on 02/13/2020  Component Date Value Ref Range Status  . MRSA, PCR 02/13/2020 NEGATIVE  NEGATIVE Final  . Staphylococcus aureus 02/13/2020 NEGATIVE  NEGATIVE Final   Comment: (NOTE) The Xpert SA Assay (FDA approved for NASAL specimens in patients 20 years of age and older), is one component of a comprehensive surveillance program. It is not intended to diagnose infection nor to guide or monitor treatment. Performed at [facility], [place] [address], [place], [place] [address]   Admission on 02/08/2020, Discharged on 02/08/2020  Component Date Value Ref Range Status  . WBC 02/08/2020 11.9* 4.0 - 10.5 K/uL Final  . RBC 02/08/2020 3.87* 4.22 - 5.81 MIL/uL Final  . Hemoglobin 02/08/2020 13.3  13.0 - 17.0 g/dL Final  . HCT 02/08/2020 39.5  39 - 52 % Final  . MCV 02/08/2020 102.1* 80.0 - 100.0 fL Final  . MCH 02/08/2020 34.4* 26.0 - 34.0 pg Final  . MCHC 02/08/2020 33.7  30.0 - 36.0 g/dL Final  . RDW 02/08/2020 14.8  11.5 - 15.5 % Final  . Platelets 02/08/2020 204  150 - 400 K/uL Final  . nRBC 02/08/2020  0.0  0.0 - 0.2 % Final  . Neutrophils Relative % 02/08/2020 87  % Final  . Neutro Abs 02/08/2020 10.4* 1.7 - 7.7 K/uL Final  . Lymphocytes Relative 02/08/2020 5  % Final  . Lymphs Abs 02/08/2020 0.6* 0.7 - 4.0 K/uL Final  . Monocytes Relative 02/08/2020 7  % Final  . Monocytes Absolute 02/08/2020 0.9  0.1 - 1.0 K/uL Final  . Eosinophils Relative 02/08/2020 0  % Final  . Eosinophils Absolute 02/08/2020 0.0  0.0 - 0.5 K/uL Final  . Basophils Relative 02/08/2020 0  % Final  . Basophils Absolute 02/08/2020 0.0  0.0 - 0.1 K/uL Final  . Immature Granulocytes 02/08/2020 1  % Final  . Abs Immature Granulocytes 02/08/2020 0.06  0.00 - 0.07 K/uL Final   Performed at [facility], [place] [address] [place], [place] [address]  .  Sodium 02/08/2020 137  135 - 145 mmol/L Final  . Potassium 02/08/2020 3.6  3.5 - 5.1 mmol/L Final  . Chloride 02/08/2020 100  98 - 111 mmol/L Final  . CO2 02/08/2020 22  22 - 32 mmol/L Final  . Glucose, Bld 02/08/2020 144* 70 - 99 mg/dL Final   Glucose reference range applies only to samples taken after fasting for at least 8 hours.  . BUN 02/08/2020 42* 8 - 23 mg/dL Final  . Creatinine, Ser 02/08/2020 1.63* 0.61 - 1.24 mg/dL Final  . Calcium 02/08/2020 10.2  8.9 - 10.3 mg/dL Final  . GFR, Estimated 02/08/2020 41* >60 mL/min Final   Comment: (NOTE) Calculated using the CKD-EPI Creatinine Equation (2021)   . Anion gap 02/08/2020 15  5 - 15 Final   Performed at [facility], [place] [address], [place], [place] [address]  . Alcohol, Ethyl (B) 02/08/2020 19* <10 mg/dL Final   Comment: (NOTE) Lowest detectable limit for serum alcohol is 10 mg/dL.  For medical purposes only. Performed at [facility], [place] [address] [place], [place] [address]   . SARS Coronavirus 2 by RT PCR 02/08/2020 NEGATIVE  NEGATIVE Final   Comment: (NOTE) SARS-CoV-2 target nucleic acids are NOT DETECTED.  The SARS-CoV-2 RNA is generally detectable in upper respiratoy specimens  during the acute phase of infection. The lowest concentration of SARS-CoV-2 viral copies this assay can detect is 131 copies/mL. A negative result does not preclude SARS-Cov-2 infection and should not be used as the sole basis for treatment or other patient management decisions. A negative result may occur with  improper specimen collection/handling, submission of specimen other than nasopharyngeal swab, presence of viral mutation(s) within the areas targeted by this assay, and inadequate number of viral copies (<131 copies/mL). A negative result must be combined with clinical observations, patient history, and epidemiological information. The expected result is Negative.  Fact Sheet for Patients:  PinkCheek.be  Fact Sheet for Healthcare Providers:  GravelBags.it  This test is no                          t yet approved or cleared by the [place] FDA and  has been authorized for detection and/or diagnosis of SARS-CoV-2 by FDA under an Emergency Use Authorization (EUA). This EUA will remain  in effect (meaning this test can be used) for the duration of the COVID-19 declaration under Section 564(b)(1) of the Act, 21 U.S.C. section 360bbb-3(b)(1), unless the authorization is terminated or revoked sooner.    . Influenza A by PCR 02/08/2020 NEGATIVE  NEGATIVE Final  . Influenza B by PCR 02/08/2020 NEGATIVE  NEGATIVE Final   Comment: (NOTE) The Xpert Xpress SARS-CoV-2/FLU/RSV assay is intended as an aid in  the diagnosis of influenza from Nasopharyngeal swab specimens and  should not be used as a sole basis for treatment. Nasal washings and  aspirates are unacceptable for Xpert Xpress SARS-CoV-2/FLU/RSV  testing.  Fact Sheet for Patients: PinkCheek.be  Fact Sheet for Healthcare Providers: GravelBags.it  This test is not yet approved or cleared by the [place]  FDA and  has been authorized for detection and/or diagnosis of SARS-CoV-2 by  FDA under an Emergency Use Authorization (EUA). This EUA will remain  in effect (meaning this test can be used) for the duration of the  Covid-19 declaration under Section 564(b)(1) of the Act, 21  U.S.C. section 360bbb-3(b)(1), unless the authorization is  terminated or revoked. Performed  at [facility], [place] [address] [place],  [address]      X-Rays:DG Shoulder Right  Result Date: 02/08/2020 CLINICAL DATA:  Pain, fall EXAM: RIGHT SHOULDER - 2+ VIEW COMPARISON:  None. FINDINGS: Perched anterior shoulder dislocation with associated impaction fracture of the posterolateral right humeral head (Hill-Sachs lesion). Additional fragmentation posteriorly likely to reflect a displaced fracture fragment from the humerus. A discrete glenoid injury is not well visualized to suggest a bony Bankart. There is a background of moderate glenohumeral and acromioclavicular arthrosis. Diffuse soft tissue swelling of the shoulder included portions of the chest are unremarkable aside from some basilar atelectatic changes. Additional degenerative features noted in the incidentally included hand. Telemetry leads overlie the chest. IMPRESSION: 1. Perched anterior shoulder dislocation with associated impaction fracture of the posterolateral right humeral head (Hill-Sachs lesion). 2. Additional fragmentation posteriorly likely to reflect a displaced fracture fragment from the humerus. 3. A discrete glenoid injury is not well visualized to suggest a bony Bankart though should reimage following relocation. Electronically Signed   By: Lovena Le M.D.   On: 02/08/2020 02:48   CT Head Wo Contrast  Result Date: 02/08/2020 CLINICAL DATA:  Fall EXAM: CT HEAD WITHOUT CONTRAST TECHNIQUE: Contiguous axial images were obtained from the base of the skull through the vertex without intravenous contrast. COMPARISON:  12/21/2019 FINDINGS: Brain:  There is no mass, hemorrhage or extra-axial collection. There is generalized atrophy without lobar predilection. Hypodensity of the white matter is most commonly associated with chronic microvascular disease. Vascular: No abnormal hyperdensity of the major intracranial arteries or dural venous sinuses. No intracranial atherosclerosis. Skull: The visualized skull base, calvarium and extracranial soft tissues are normal. Sinuses/Orbits: No fluid levels or advanced mucosal thickening of the visualized paranasal sinuses. No mastoid or middle ear effusion. The orbits are normal. IMPRESSION: Generalized atrophy and chronic microvascular ischemia without acute intracranial abnormality. Electronically Signed   By: Ulyses Jarred M.D.   On: 02/08/2020 03:44   CT Shoulder Right Wo Contrast  Result Date: 02/08/2020 CLINICAL DATA:  Right shoulder dislocation.  Unsuccessful reduction. EXAM: CT OF THE UPPER RIGHT EXTREMITY WITHOUT CONTRAST TECHNIQUE: Multidetector CT imaging of the right shoulder was performed according to the standard protocol. COMPARISON:  Right shoulder x-rays from same day. FINDINGS: Bones/Joint/Cartilage Continued anterior dislocation of the humeral head with respect to the glenoid. Large impaction fracture of the posterosuperior humeral head again noted. No glenoid fracture. Mild glenohumeral and acromioclavicular osteoarthritis. Small hemarthrosis. Advanced degenerative changes of the cervical spine. Ligaments Ligaments are suboptimally evaluated by CT. Muscles and Tendons Mild supraspinatus and moderate infraspinatus muscle atrophy. Soft tissue Soft tissue swelling in the right axilla. No fluid collection or hematoma. No soft tissue mass. Mild centrilobular emphysema in the lungs. IMPRESSION: 1. Continued anterior shoulder dislocation with large Hill-Sachs deformity. 2. Mild glenohumeral and acromioclavicular osteoarthritis. Electronically Signed   By: Titus Dubin M.D.   On: 02/08/2020 09:56    DG Shoulder Right Port  Result Date: 02/14/2020 CLINICAL DATA:  Status post reversed shoulder arthroplasty. EXAM: PORTABLE RIGHT SHOULDER COMPARISON:  Radiographs 02/08/2020 FINDINGS: The humeral and glenoid components appear well seated. No complicating features are identified. IMPRESSION: Well seated components of a total shoulder arthroplasty. Electronically Signed   By: Marijo Sanes M.D.   On: 02/14/2020 19:17   DG Shoulder Right Portable  Result Date: 02/08/2020 CLINICAL DATA:  Post reduction right shoulder EXAM: PORTABLE RIGHT SHOULDER COMPARISON:  Earlier today FINDINGS: Continued anterior glenohumeral dislocation with Hill-Sachs deformity. Degenerative spurring  at the glenohumeral and acromioclavicular joints. Negative right ribs and apical lung. IMPRESSION: Persistent anterior glenohumeral dislocation. Electronically Signed   By: Monte Fantasia M.D.   On: 02/08/2020 05:31    EKG: Orders placed or performed during the hospital encounter of 02/08/20  . ED EKG  . ED EKG  . EKG 12-Lead  . EKG 12-Lead     Hospital Course: Glenn Mczeal. is a 86 y.o. who was admitted to Hospital. They were brought to the operating room on 02/14/2020 and underwent Procedure(s): [place].  Patient tolerated the procedure well and was later transferred to the recovery room and then to the orthopaedic floor for postoperative care.  They were given PO and IV analgesics for pain control following their surgery.  They were given 24 hours of postoperative antibiotics of  Anti-infectives (From admission, onward)   Start     Dose/Rate Route Frequency Ordered Stop   02/14/20 2100  ceFAZolin (ANCEF) IVPB 1 g/50 mL premix        1 g 100 mL/hr over 30 Minutes Intravenous Every 6 hours 02/14/20 1833 02/15/20 0904   02/14/20 1558  vancomycin (VANCOCIN) powder  Status:  Discontinued          As needed 02/14/20 1558 02/14/20 1833   02/14/20 1151  ceFAZolin (ANCEF) 2-4 GM/100ML-% IVPB        Note to Pharmacy: Marchia Meiers   : cabinet override      02/14/20 1151 02/14/20 1457   02/14/20 1145  ceFAZolin (ANCEF) IVPB 2g/100 mL premix        2 g 200 mL/hr over 30 Minutes Intravenous On call to O.R. 02/14/20 1141 02/14/20 1456     and started on DVT prophylaxis in the form of Aspirin.   PT and OT were ordered for total joint protocol.  Discharge planning consulted to help with postop disposition and equipment needs.  Patient had an uneventful night on the evening of surgery. His block wore off around 5am.  They started to get up OOB with therapy on day one. His aquacell bandage was maintained without drainage Patient was seen in rounds and was ready to go home. He noted his block wore off around 5am and is able to move his shoulder, elbow, and hand. He denied numbness or tingling. Axillary nerve was intact to sensation and + deltoid firing. He can discontinue his sling and move the shoulder as tolerated. He will begin 81mg  of aspirin for DVT prophylaxis, pain medications have been sent to his pharmacy.He will maintain his bandage for the next 2 weeks. Refer to discharge instructions for further detail on post-operative instructions. Advised to drink plenty of fluids at home as creatine is elevated. His hemoglobin was 11.3 and did not require transfusion.    Diet: Regular diet Activity:No lifting more than 5 pounds with the right upper extremity. PROM and AROM as tolerated.  Follow-up:in 2 weeks with Dr. Stann Mainland at [facility] Disposition - Home Discharged Condition: good    Allergies as of 02/15/2020      Reactions   Tetracyclines & Related Other (See Comments)   Infection of penis      Medication List    STOP taking these medications   HYDROcodone-acetaminophen 5-325 MG tablet Commonly known as: NORCO/VICODIN     TAKE these medications   acetaminophen 500 MG tablet Commonly known as: TYLENOL Take 1,000 mg by mouth every 8 (eight) hours as needed for moderate pain.    allopurinol 300 MG  tablet Commonly known as: ZYLOPRIM Take 300 mg by mouth daily.   ibuprofen 200 MG tablet Commonly known as: ADVIL Take 600 mg by mouth every 8 (eight) hours as needed for moderate pain.   MULTIVITAMIN ADULT PO Take 1 tablet by mouth daily.   ondansetron 4 MG disintegrating tablet Commonly known as: Zofran ODT Take 1 tablet (4 mg total) by mouth every 8 (eight) hours as needed for nausea or vomiting.   oxyCODONE 5 MG immediate release tablet Commonly known as: Roxicodone Take 1 tablet (5 mg total) by mouth every 4 (four) hours as needed for up to 7 days for moderate pain or severe pain.   potassium chloride 10 MEQ tablet Commonly known as: KLOR-CON Take 10 mEq by mouth 2 (two) times daily.   torsemide 10 MG tablet Commonly known as: DEMADEX Take 5 mg by mouth daily as needed (fluid).   triamterene-hydrochlorothiazide 37.5-25 MG tablet Commonly known as: MAXZIDE-25 Take 1 tablet by mouth daily.       Follow-up Information    [name], MD In 2 weeks.   Specialty: Orthopedic Surgery Why: For wound re-check Contact information: [address] [place] [address] [phone]               Signed: Jonelle Sidle PA-C Orthopaedic Surgery 02/15/2020, 11:48 AM

## 2020-02-22 ENCOUNTER — Encounter (HOSPITAL_COMMUNITY): Payer: Self-pay | Admitting: Orthopedic Surgery

## 2020-04-02 DIAGNOSIS — Z9181 History of falling: Secondary | ICD-10-CM | POA: Diagnosis not present

## 2020-04-02 DIAGNOSIS — I10 Essential (primary) hypertension: Secondary | ICD-10-CM | POA: Diagnosis not present

## 2020-04-02 DIAGNOSIS — Z96612 Presence of left artificial shoulder joint: Secondary | ICD-10-CM | POA: Diagnosis not present

## 2020-04-02 DIAGNOSIS — H9193 Unspecified hearing loss, bilateral: Secondary | ICD-10-CM | POA: Diagnosis not present

## 2020-04-02 DIAGNOSIS — Z471 Aftercare following joint replacement surgery: Secondary | ICD-10-CM | POA: Diagnosis not present

## 2020-04-02 DIAGNOSIS — M170 Bilateral primary osteoarthritis of knee: Secondary | ICD-10-CM | POA: Diagnosis not present

## 2020-04-03 DIAGNOSIS — Z4789 Encounter for other orthopedic aftercare: Secondary | ICD-10-CM | POA: Diagnosis not present

## 2020-04-04 DIAGNOSIS — Z85828 Personal history of other malignant neoplasm of skin: Secondary | ICD-10-CM | POA: Diagnosis not present

## 2020-04-04 DIAGNOSIS — L570 Actinic keratosis: Secondary | ICD-10-CM | POA: Diagnosis not present

## 2020-04-04 DIAGNOSIS — L821 Other seborrheic keratosis: Secondary | ICD-10-CM | POA: Diagnosis not present

## 2020-04-04 DIAGNOSIS — L812 Freckles: Secondary | ICD-10-CM | POA: Diagnosis not present

## 2020-04-05 DIAGNOSIS — I10 Essential (primary) hypertension: Secondary | ICD-10-CM | POA: Diagnosis not present

## 2020-04-05 DIAGNOSIS — H9193 Unspecified hearing loss, bilateral: Secondary | ICD-10-CM | POA: Diagnosis not present

## 2020-04-05 DIAGNOSIS — Z471 Aftercare following joint replacement surgery: Secondary | ICD-10-CM | POA: Diagnosis not present

## 2020-04-05 DIAGNOSIS — Z96612 Presence of left artificial shoulder joint: Secondary | ICD-10-CM | POA: Diagnosis not present

## 2020-04-05 DIAGNOSIS — M170 Bilateral primary osteoarthritis of knee: Secondary | ICD-10-CM | POA: Diagnosis not present

## 2020-04-05 DIAGNOSIS — Z9181 History of falling: Secondary | ICD-10-CM | POA: Diagnosis not present

## 2020-04-06 DIAGNOSIS — G720 Drug-induced myopathy: Secondary | ICD-10-CM | POA: Diagnosis not present

## 2020-04-06 DIAGNOSIS — E7800 Pure hypercholesterolemia, unspecified: Secondary | ICD-10-CM | POA: Diagnosis not present

## 2020-04-06 DIAGNOSIS — N1831 Chronic kidney disease, stage 3a: Secondary | ICD-10-CM | POA: Diagnosis not present

## 2020-04-06 DIAGNOSIS — I129 Hypertensive chronic kidney disease with stage 1 through stage 4 chronic kidney disease, or unspecified chronic kidney disease: Secondary | ICD-10-CM | POA: Diagnosis not present

## 2020-04-06 DIAGNOSIS — Z0000 Encounter for general adult medical examination without abnormal findings: Secondary | ICD-10-CM | POA: Diagnosis not present

## 2020-04-06 DIAGNOSIS — R7301 Impaired fasting glucose: Secondary | ICD-10-CM | POA: Diagnosis not present

## 2020-04-06 DIAGNOSIS — M109 Gout, unspecified: Secondary | ICD-10-CM | POA: Diagnosis not present

## 2020-04-06 DIAGNOSIS — D509 Iron deficiency anemia, unspecified: Secondary | ICD-10-CM | POA: Diagnosis not present

## 2020-04-06 DIAGNOSIS — Z789 Other specified health status: Secondary | ICD-10-CM | POA: Diagnosis not present

## 2020-04-06 DIAGNOSIS — Z1389 Encounter for screening for other disorder: Secondary | ICD-10-CM | POA: Diagnosis not present

## 2020-05-23 DIAGNOSIS — Z96611 Presence of right artificial shoulder joint: Secondary | ICD-10-CM | POA: Diagnosis not present

## 2020-05-23 DIAGNOSIS — M25511 Pain in right shoulder: Secondary | ICD-10-CM | POA: Diagnosis not present

## 2020-05-28 DIAGNOSIS — H31002 Unspecified chorioretinal scars, left eye: Secondary | ICD-10-CM | POA: Diagnosis not present

## 2020-05-28 DIAGNOSIS — Z961 Presence of intraocular lens: Secondary | ICD-10-CM | POA: Diagnosis not present

## 2020-05-28 DIAGNOSIS — H52203 Unspecified astigmatism, bilateral: Secondary | ICD-10-CM | POA: Diagnosis not present

## 2020-05-28 DIAGNOSIS — H43813 Vitreous degeneration, bilateral: Secondary | ICD-10-CM | POA: Diagnosis not present

## 2020-06-22 ENCOUNTER — Other Ambulatory Visit: Payer: Self-pay | Admitting: Internal Medicine

## 2020-06-22 ENCOUNTER — Ambulatory Visit
Admission: RE | Admit: 2020-06-22 | Discharge: 2020-06-22 | Disposition: A | Discharge status: Home / Self Care | Payer: Medicare Other | Source: Ambulatory Visit | Attending: Internal Medicine | Admitting: Internal Medicine

## 2020-06-22 DIAGNOSIS — R519 Headache, unspecified: Secondary | ICD-10-CM

## 2020-06-22 DIAGNOSIS — R413 Other amnesia: Secondary | ICD-10-CM | POA: Diagnosis not present

## 2020-06-22 DIAGNOSIS — I129 Hypertensive chronic kidney disease with stage 1 through stage 4 chronic kidney disease, or unspecified chronic kidney disease: Secondary | ICD-10-CM | POA: Diagnosis not present

## 2020-06-22 IMAGING — CT CT HEAD W/O CM
4 series · 15 of 47 positions shown, 17 images · non-contrast
Comparison: [DATE]

CLINICAL DATA: Headache. Memory loss. History of renal cell
carcinoma. Duration of symptoms approximately 5 weeks.

EXAM:
CT HEAD WITHOUT CONTRAST
TECHNIQUE: Contiguous axial images were obtained from the base of the skull
through the vertex without intravenous contrast.

[Series 2: head 5.00 hr40 s3 axial ibhc · axial · 0.44mm/px · z∈[-716,-601]mm · 6 of 33 slices shown, 8 images]
[im 5/33  brain]
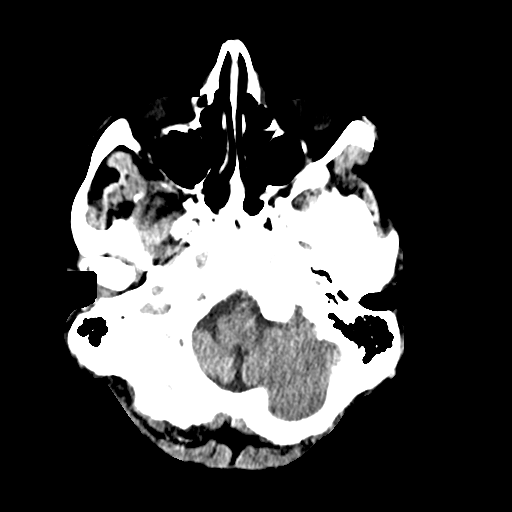
[im 5/33  bone]
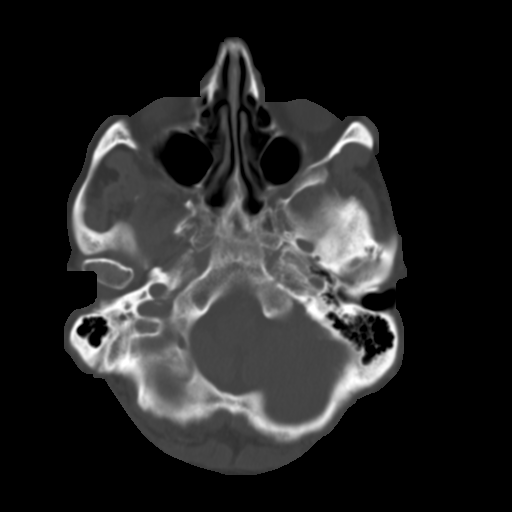
[im 10/33  brain]
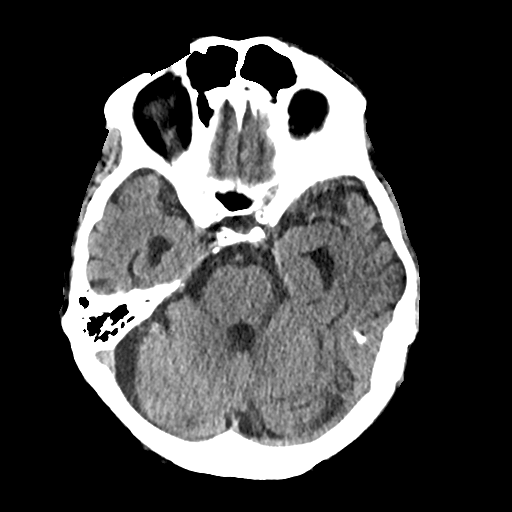
[im 14/33  brain]
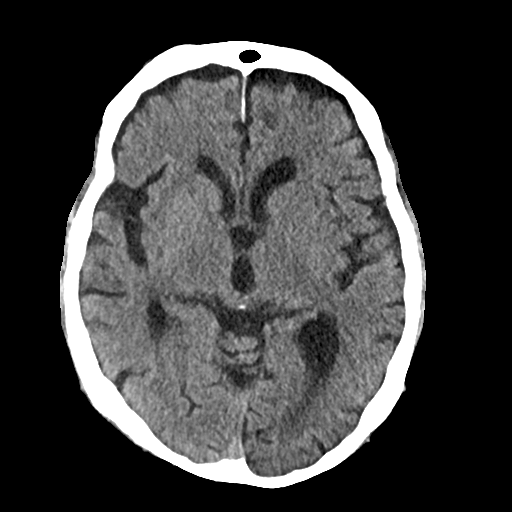
[im 19/33  brain]
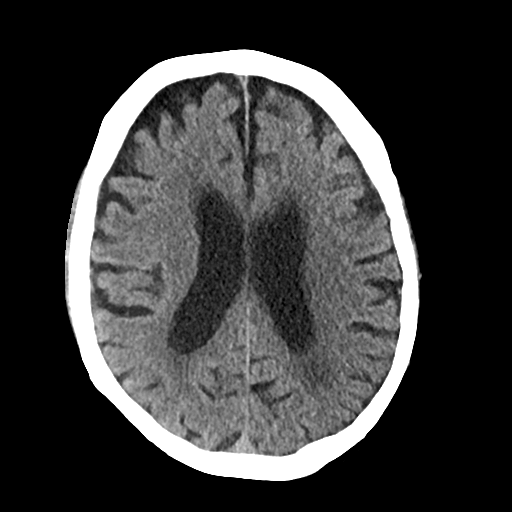
[im 23/33  brain]
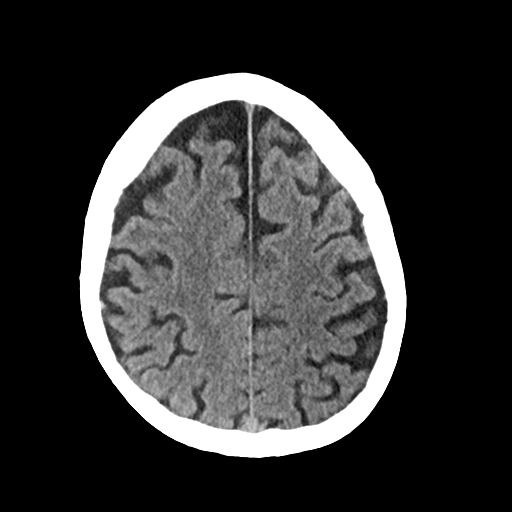
[im 23/33  bone]
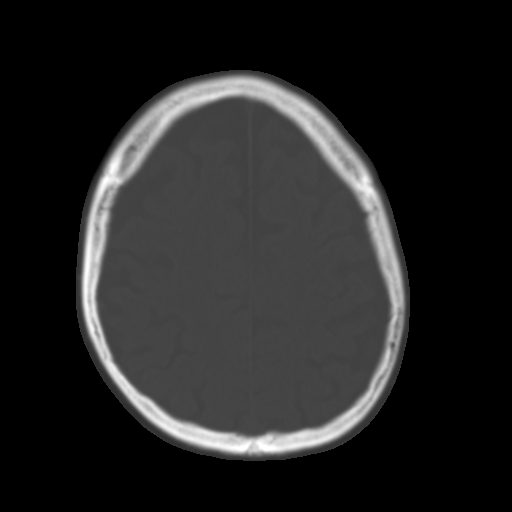
[im 28/33  brain]
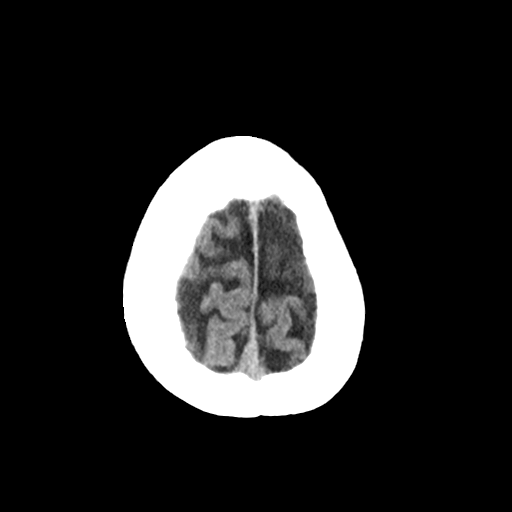

[Series 3: head 2.00 hr60 s3 axial bone · axial · 0.44mm/px · z∈[-724,-684]mm · 3 of 84 slices shown]
[im 8/84  bone]
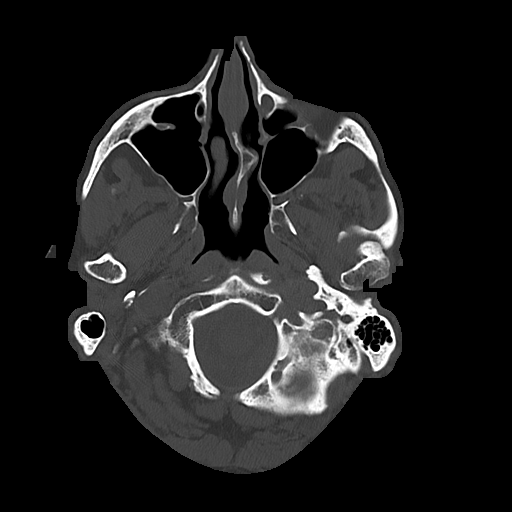
[im 16/84  bone]
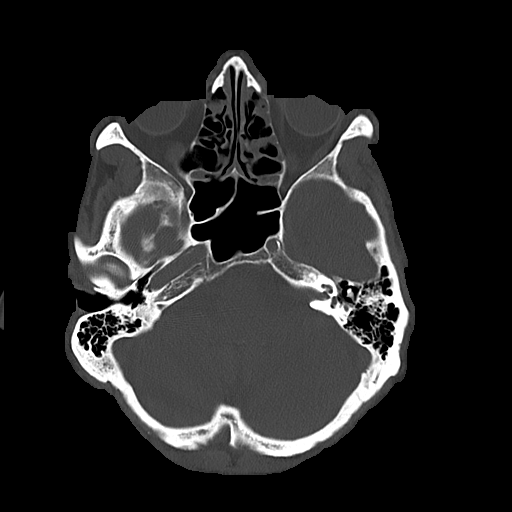
[im 28/84  bone]
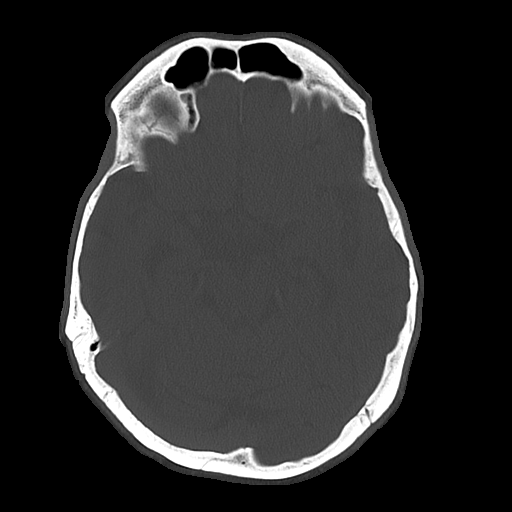

[Series 4: head 3.00 hr40 s3 sag · sagittal · 0.33mm/px · 3 of 74 slices shown]
[im 25/74  brain]
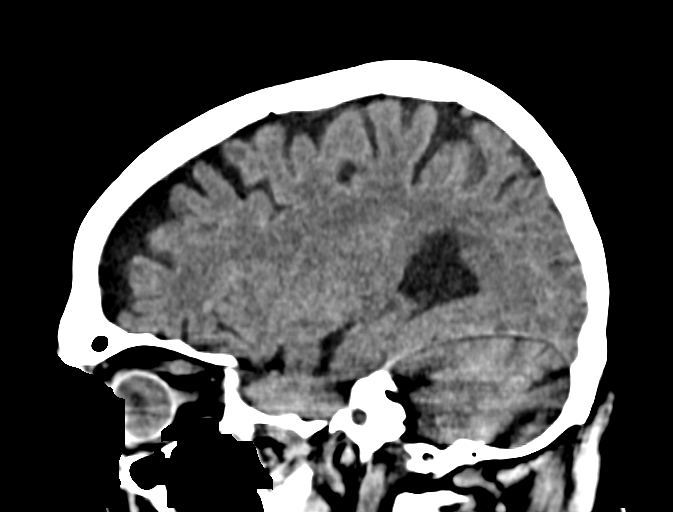
[im 37/74  brain]
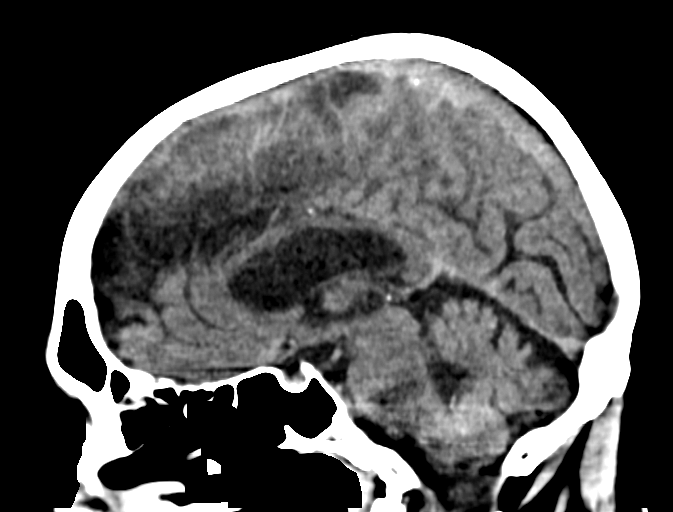
[im 49/74  brain]
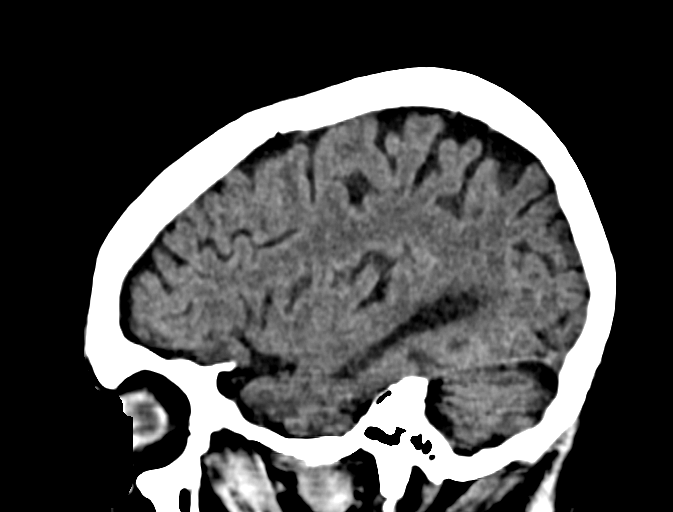

[Series 6: head 3.00 hr40 s3 cor · coronal · 0.33mm/px · 3 of 74 slices shown]
[im 25/74  brain]
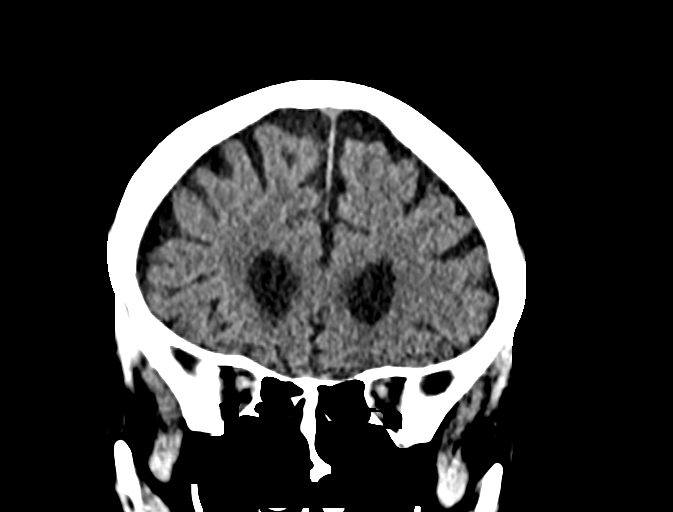
[im 33/74  brain]
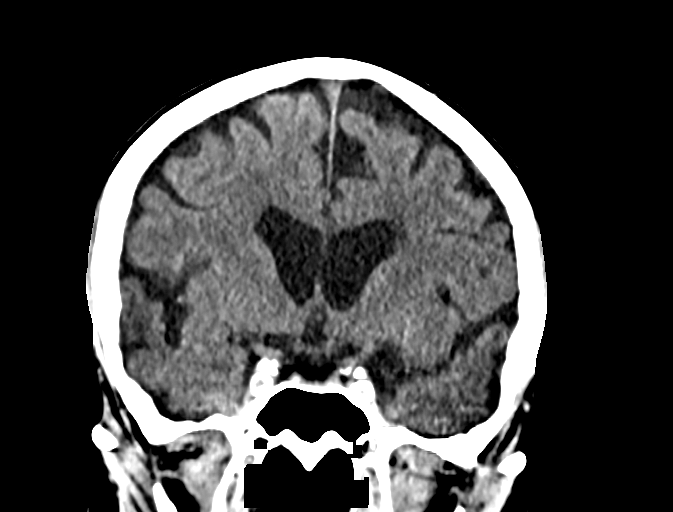
[im 41/74  brain]
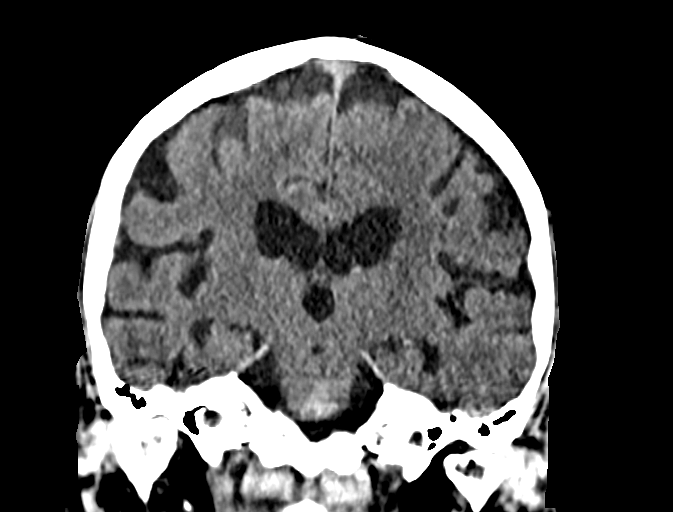

[15 of 47 positions shown; findings below may reference images not displayed]

FINDINGS: Brain: Age related volume loss, not definitely advanced. No focal
abnormality affects the brainstem or cerebellum. Cerebral
hemispheres show mild small vessel change of the white matter,
typical for age. No cortical or large vessel territory infarction.
No sign of mass lesion, hemorrhage, hydrocephalus or extra-axial
collection.

Vascular: There is atherosclerotic calcification of the major
vessels at the base of the brain.

Skull: Normal

Sinuses/Orbits: Clear/normal

Other: None
IMPRESSION: No acute or reversible finding. Age related volume loss and mild
chronic small-vessel change of the white matter. Atherosclerotic
calcification of the major vessels at the base of the brain.

## 2020-08-06 DIAGNOSIS — M7061 Trochanteric bursitis, right hip: Secondary | ICD-10-CM | POA: Diagnosis not present

## 2020-08-14 DIAGNOSIS — Z862 Personal history of diseases of the blood and blood-forming organs and certain disorders involving the immune mechanism: Secondary | ICD-10-CM | POA: Diagnosis not present

## 2020-08-29 DIAGNOSIS — M25511 Pain in right shoulder: Secondary | ICD-10-CM | POA: Diagnosis not present

## 2020-08-29 DIAGNOSIS — M7061 Trochanteric bursitis, right hip: Secondary | ICD-10-CM | POA: Diagnosis not present

## 2020-09-03 DIAGNOSIS — G8929 Other chronic pain: Secondary | ICD-10-CM | POA: Diagnosis not present

## 2020-09-03 DIAGNOSIS — R2681 Unsteadiness on feet: Secondary | ICD-10-CM | POA: Diagnosis not present

## 2020-09-03 DIAGNOSIS — M25511 Pain in right shoulder: Secondary | ICD-10-CM | POA: Diagnosis not present

## 2020-09-03 DIAGNOSIS — Z96611 Presence of right artificial shoulder joint: Secondary | ICD-10-CM | POA: Diagnosis not present

## 2020-09-03 DIAGNOSIS — G250 Essential tremor: Secondary | ICD-10-CM | POA: Diagnosis not present

## 2020-09-28 DIAGNOSIS — R58 Hemorrhage, not elsewhere classified: Secondary | ICD-10-CM | POA: Diagnosis not present

## 2020-10-03 DIAGNOSIS — Z905 Acquired absence of kidney: Secondary | ICD-10-CM | POA: Diagnosis not present

## 2020-10-03 DIAGNOSIS — N289 Disorder of kidney and ureter, unspecified: Secondary | ICD-10-CM | POA: Diagnosis not present

## 2020-10-05 DIAGNOSIS — M7061 Trochanteric bursitis, right hip: Secondary | ICD-10-CM | POA: Diagnosis not present

## 2020-10-09 DIAGNOSIS — Z85828 Personal history of other malignant neoplasm of skin: Secondary | ICD-10-CM | POA: Diagnosis not present

## 2020-10-09 DIAGNOSIS — L821 Other seborrheic keratosis: Secondary | ICD-10-CM | POA: Diagnosis not present

## 2020-10-09 DIAGNOSIS — D1801 Hemangioma of skin and subcutaneous tissue: Secondary | ICD-10-CM | POA: Diagnosis not present

## 2020-10-09 DIAGNOSIS — L720 Epidermal cyst: Secondary | ICD-10-CM | POA: Diagnosis not present

## 2020-10-09 DIAGNOSIS — D692 Other nonthrombocytopenic purpura: Secondary | ICD-10-CM | POA: Diagnosis not present

## 2020-10-09 DIAGNOSIS — L820 Inflamed seborrheic keratosis: Secondary | ICD-10-CM | POA: Diagnosis not present

## 2020-10-09 DIAGNOSIS — L570 Actinic keratosis: Secondary | ICD-10-CM | POA: Diagnosis not present

## 2020-11-01 DIAGNOSIS — N183 Chronic kidney disease, stage 3 unspecified: Secondary | ICD-10-CM | POA: Diagnosis not present

## 2020-11-01 DIAGNOSIS — Z862 Personal history of diseases of the blood and blood-forming organs and certain disorders involving the immune mechanism: Secondary | ICD-10-CM | POA: Diagnosis not present

## 2020-11-14 DIAGNOSIS — M7061 Trochanteric bursitis, right hip: Secondary | ICD-10-CM | POA: Diagnosis not present

## 2020-11-26 DIAGNOSIS — Z5181 Encounter for therapeutic drug level monitoring: Secondary | ICD-10-CM | POA: Diagnosis not present

## 2020-11-26 DIAGNOSIS — M719 Bursopathy, unspecified: Secondary | ICD-10-CM | POA: Diagnosis not present

## 2020-12-06 DIAGNOSIS — Z5181 Encounter for therapeutic drug level monitoring: Secondary | ICD-10-CM | POA: Diagnosis not present

## 2020-12-06 DIAGNOSIS — I129 Hypertensive chronic kidney disease with stage 1 through stage 4 chronic kidney disease, or unspecified chronic kidney disease: Secondary | ICD-10-CM | POA: Diagnosis not present

## 2020-12-11 DIAGNOSIS — M25551 Pain in right hip: Secondary | ICD-10-CM | POA: Diagnosis not present

## 2020-12-13 DIAGNOSIS — M25551 Pain in right hip: Secondary | ICD-10-CM | POA: Diagnosis not present

## 2020-12-13 DIAGNOSIS — M7061 Trochanteric bursitis, right hip: Secondary | ICD-10-CM | POA: Diagnosis not present

## 2020-12-21 DIAGNOSIS — M25551 Pain in right hip: Secondary | ICD-10-CM | POA: Diagnosis not present

## 2020-12-21 DIAGNOSIS — M7061 Trochanteric bursitis, right hip: Secondary | ICD-10-CM | POA: Diagnosis not present

## 2020-12-24 DIAGNOSIS — M25551 Pain in right hip: Secondary | ICD-10-CM | POA: Diagnosis not present

## 2020-12-24 DIAGNOSIS — M7061 Trochanteric bursitis, right hip: Secondary | ICD-10-CM | POA: Diagnosis not present

## 2020-12-27 DIAGNOSIS — M7061 Trochanteric bursitis, right hip: Secondary | ICD-10-CM | POA: Diagnosis not present

## 2020-12-27 DIAGNOSIS — M25551 Pain in right hip: Secondary | ICD-10-CM | POA: Diagnosis not present

## 2020-12-31 DIAGNOSIS — M7061 Trochanteric bursitis, right hip: Secondary | ICD-10-CM | POA: Diagnosis not present

## 2020-12-31 DIAGNOSIS — M25551 Pain in right hip: Secondary | ICD-10-CM | POA: Diagnosis not present

## 2021-01-01 DIAGNOSIS — R42 Dizziness and giddiness: Secondary | ICD-10-CM | POA: Diagnosis not present

## 2021-01-01 DIAGNOSIS — R531 Weakness: Secondary | ICD-10-CM | POA: Diagnosis not present

## 2021-01-01 DIAGNOSIS — Z5181 Encounter for therapeutic drug level monitoring: Secondary | ICD-10-CM | POA: Diagnosis not present

## 2021-01-03 DIAGNOSIS — M25551 Pain in right hip: Secondary | ICD-10-CM | POA: Diagnosis not present

## 2021-01-03 DIAGNOSIS — M7061 Trochanteric bursitis, right hip: Secondary | ICD-10-CM | POA: Diagnosis not present

## 2021-01-07 DIAGNOSIS — M7061 Trochanteric bursitis, right hip: Secondary | ICD-10-CM | POA: Diagnosis not present

## 2021-01-07 DIAGNOSIS — M25551 Pain in right hip: Secondary | ICD-10-CM | POA: Diagnosis not present

## 2021-01-10 DIAGNOSIS — M25551 Pain in right hip: Secondary | ICD-10-CM | POA: Diagnosis not present

## 2021-01-10 DIAGNOSIS — M7061 Trochanteric bursitis, right hip: Secondary | ICD-10-CM | POA: Diagnosis not present

## 2021-01-11 DIAGNOSIS — Z79899 Other long term (current) drug therapy: Secondary | ICD-10-CM | POA: Diagnosis not present

## 2021-01-11 DIAGNOSIS — Z5181 Encounter for therapeutic drug level monitoring: Secondary | ICD-10-CM | POA: Diagnosis not present

## 2021-01-11 DIAGNOSIS — D509 Iron deficiency anemia, unspecified: Secondary | ICD-10-CM | POA: Diagnosis not present

## 2021-01-14 DIAGNOSIS — M6281 Muscle weakness (generalized): Secondary | ICD-10-CM | POA: Diagnosis not present

## 2021-01-14 DIAGNOSIS — M7918 Myalgia, other site: Secondary | ICD-10-CM | POA: Diagnosis not present

## 2021-01-17 DIAGNOSIS — M79604 Pain in right leg: Secondary | ICD-10-CM | POA: Diagnosis not present

## 2021-01-17 DIAGNOSIS — M25551 Pain in right hip: Secondary | ICD-10-CM | POA: Diagnosis not present

## 2021-01-17 DIAGNOSIS — M7061 Trochanteric bursitis, right hip: Secondary | ICD-10-CM | POA: Diagnosis not present

## 2021-01-18 ENCOUNTER — Ambulatory Visit
Admission: RE | Admit: 2021-01-18 | Discharge: 2021-01-18 | Disposition: A | Discharge status: Home / Self Care | Payer: Medicare Other | Source: Ambulatory Visit | Attending: Sports Medicine | Admitting: Sports Medicine

## 2021-01-18 ENCOUNTER — Other Ambulatory Visit: Payer: Self-pay

## 2021-01-18 ENCOUNTER — Other Ambulatory Visit: Payer: Self-pay | Admitting: Sports Medicine

## 2021-01-18 DIAGNOSIS — M545 Low back pain, unspecified: Secondary | ICD-10-CM

## 2021-01-18 IMAGING — CR DG LUMBAR SPINE 2-3V
3 series · 3 of 3 positions shown · non-contrast
Comparison: None.

CLINICAL DATA: Lumbar back pain. Patient reports chronic right
lateral hip pain radiating down the thigh. Bilateral low back
discomfort.

EXAM:
LUMBAR SPINE - 2-3 VIEW

[w lumbar spine ap]
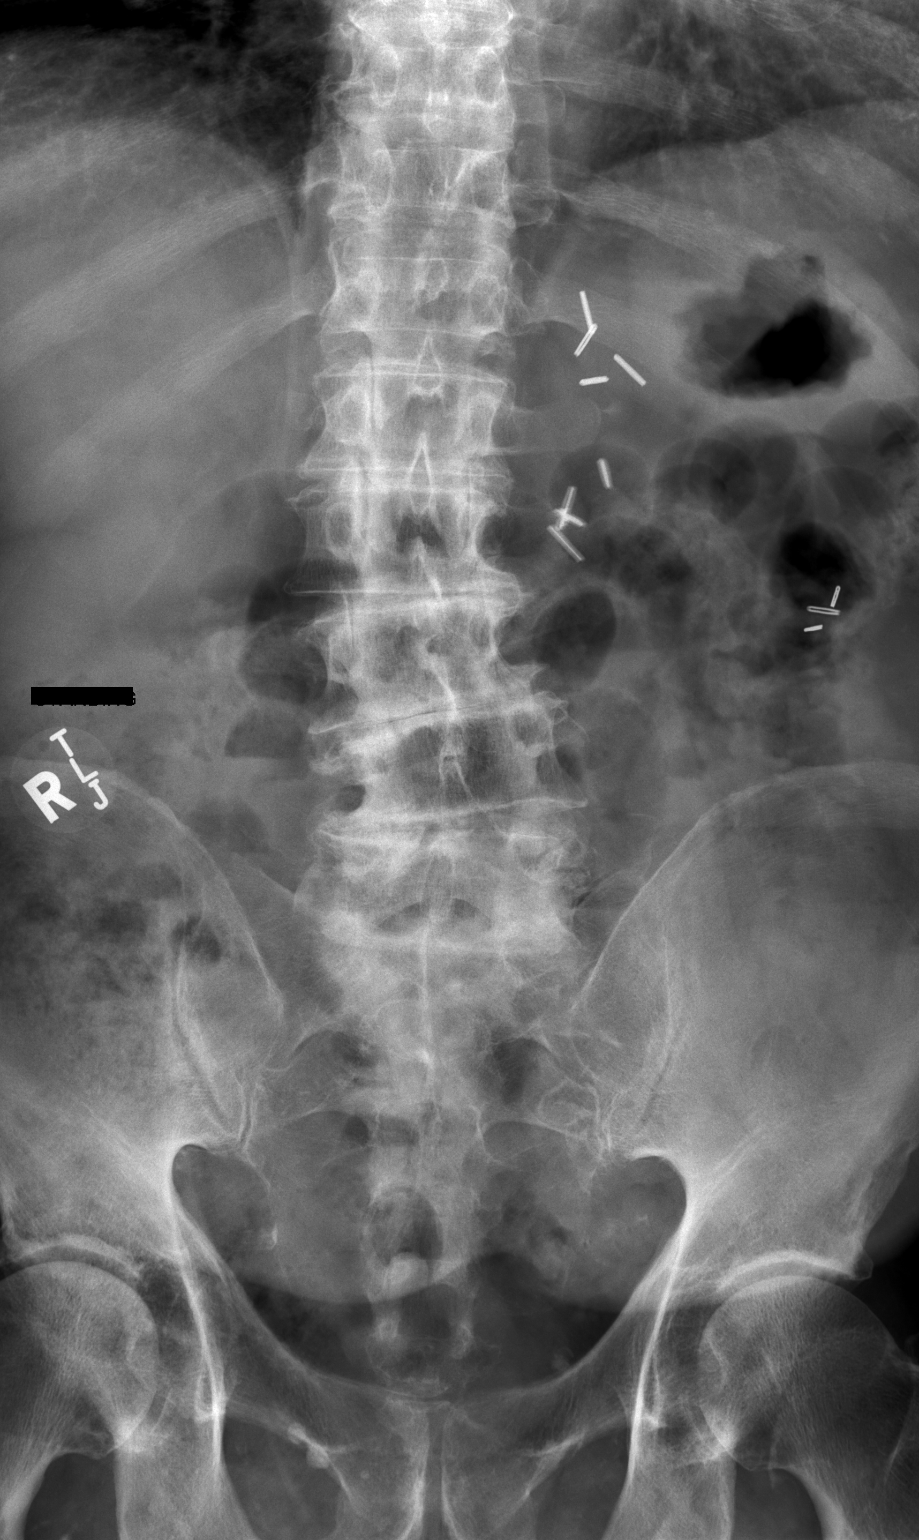

[w lumbar spine lat]
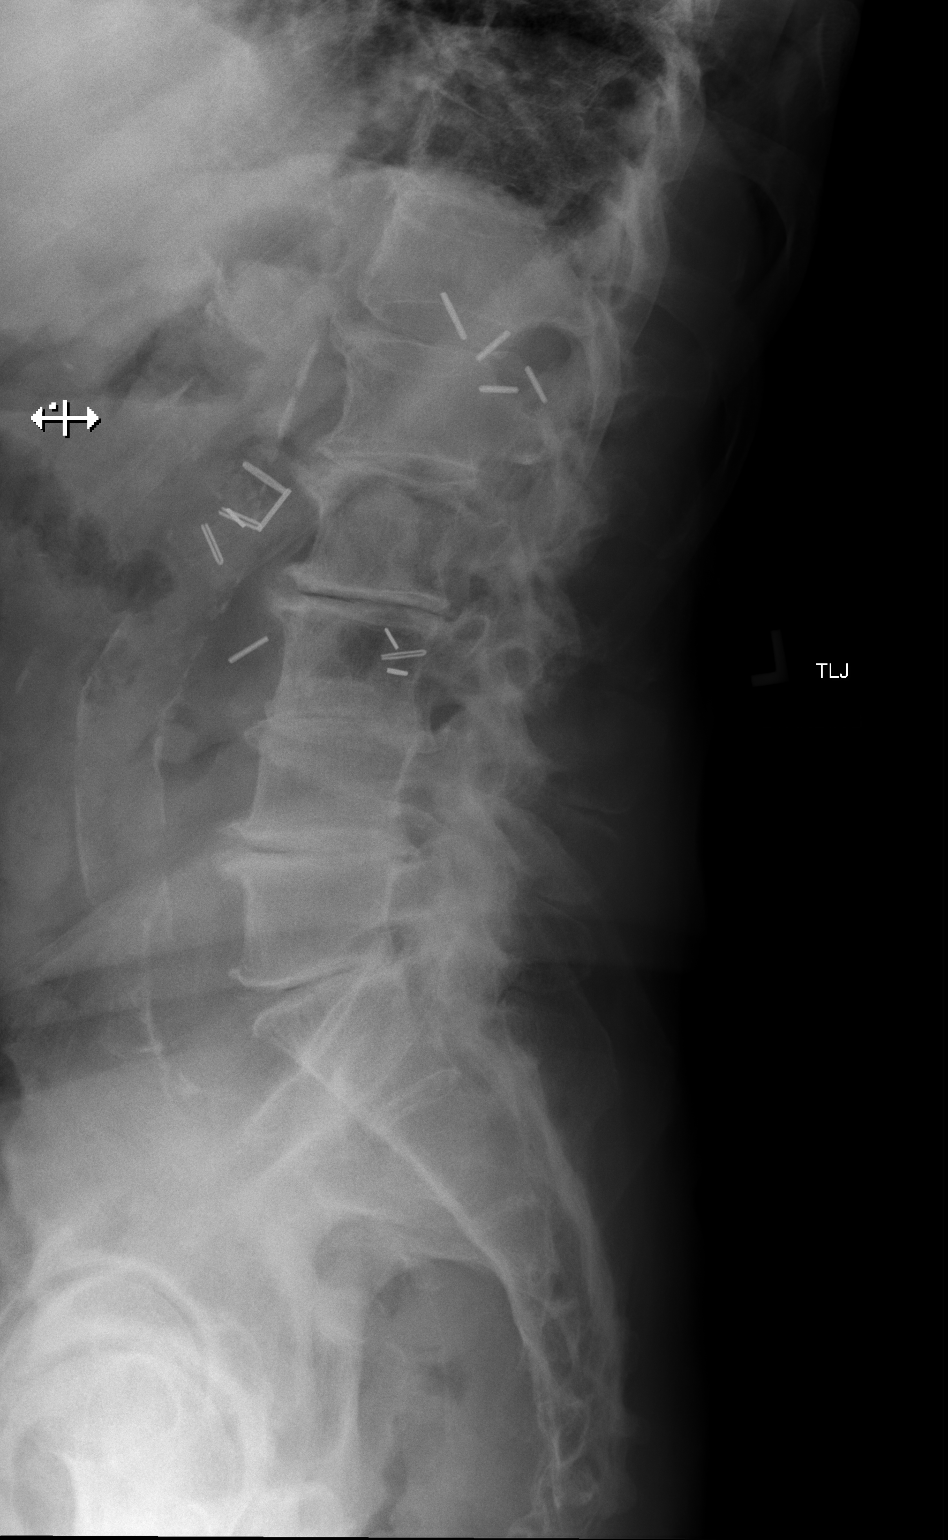

[w lumbar l-5 s-1 spot]
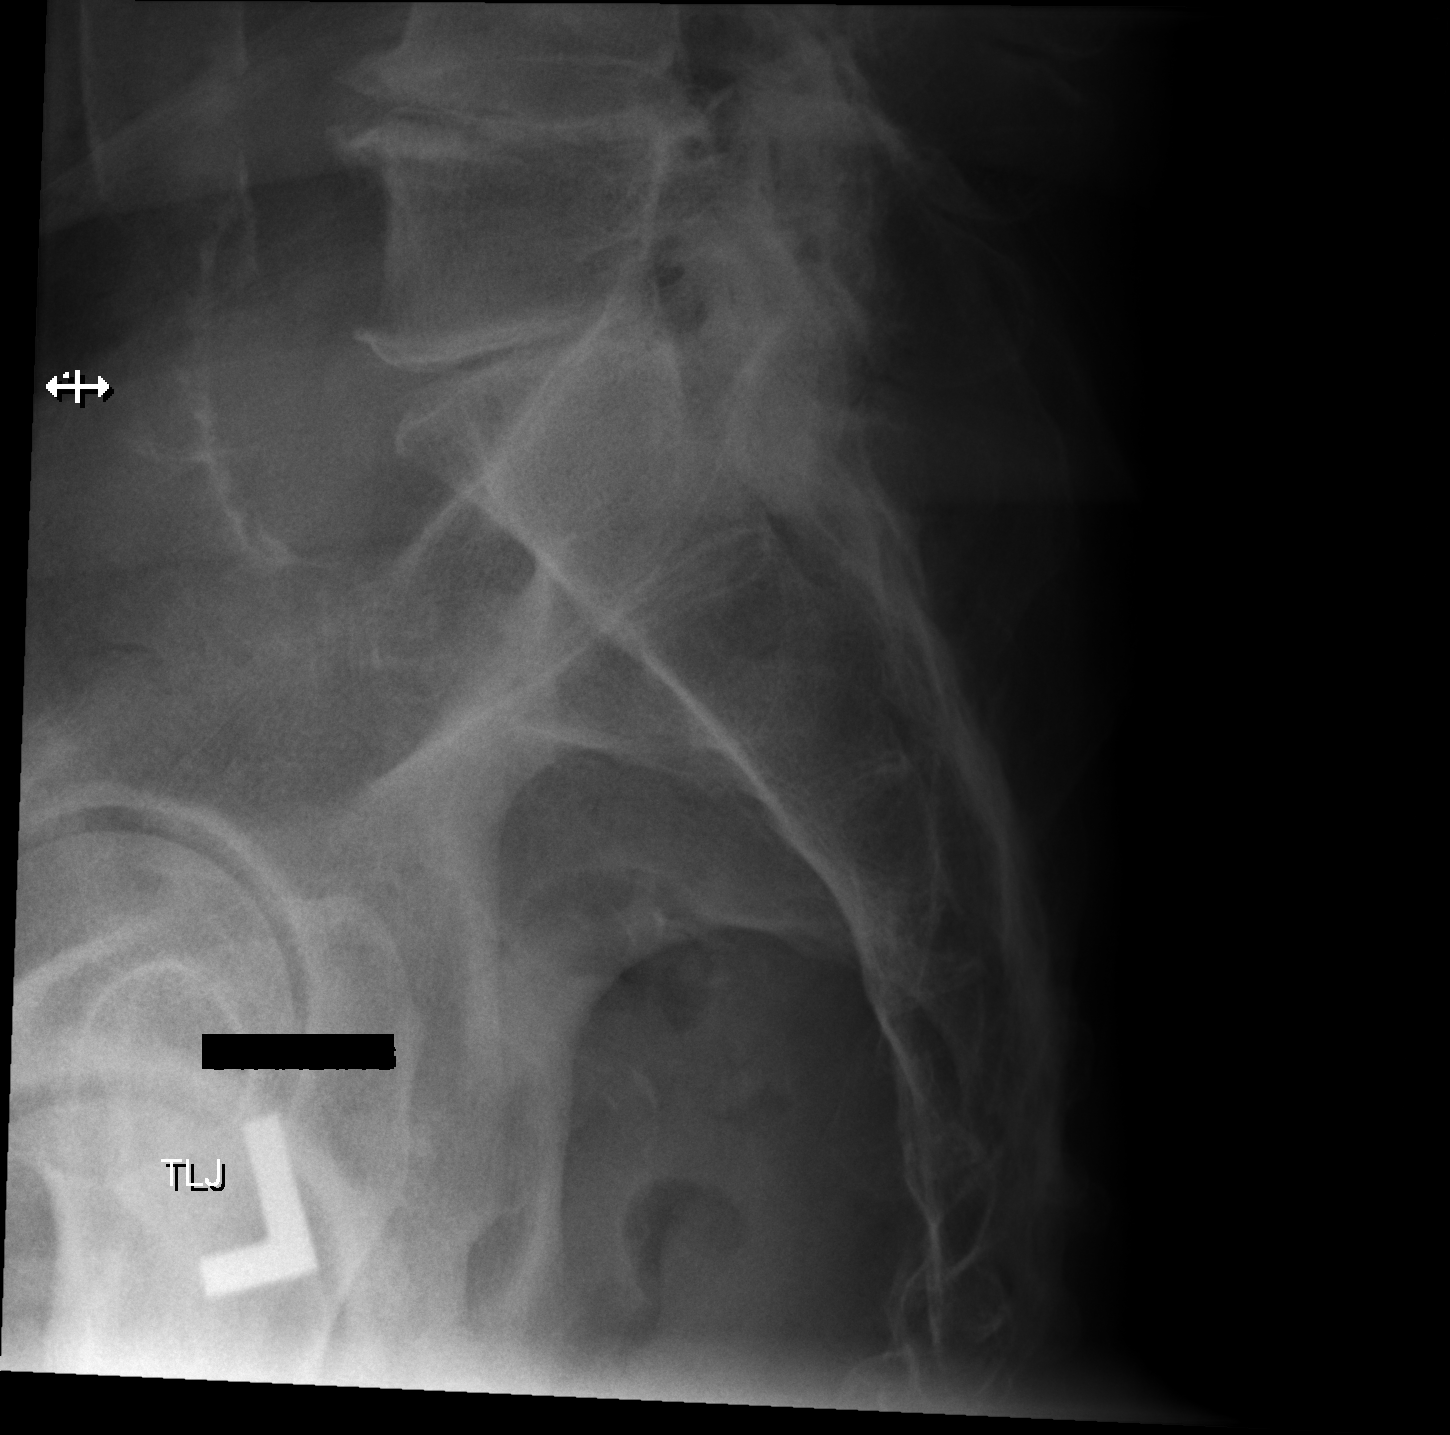

[3 of 3 positions shown; findings below may reference images not displayed]

FINDINGS: Five lumbar type vertebra. Levo scoliotic curvature centered at
L3-L4. Diffuse disc space narrowing and endplate spurring throughout
the entire lumbar spine. Diffuse facet hypertrophy, prominent at
L4-L5 and L5-S1. Vertebral body heights are normal. No evidence of
fracture or bony destruction. Advanced aortic atherosclerosis.
Multiple surgical clips in the left abdomen. The sacroiliac joints
are congruent with only minimal degenerative change.
IMPRESSION: 1. Prominent diffuse degenerative disc disease and facet hypertrophy
throughout the lumbar spine.
2. Lower lumbar levoscoliosis.

## 2021-01-21 DIAGNOSIS — M25551 Pain in right hip: Secondary | ICD-10-CM | POA: Diagnosis not present

## 2021-01-21 DIAGNOSIS — M7061 Trochanteric bursitis, right hip: Secondary | ICD-10-CM | POA: Diagnosis not present

## 2021-01-22 ENCOUNTER — Other Ambulatory Visit: Payer: Self-pay | Admitting: Sports Medicine

## 2021-01-22 DIAGNOSIS — M5441 Lumbago with sciatica, right side: Secondary | ICD-10-CM

## 2021-01-24 DIAGNOSIS — M25551 Pain in right hip: Secondary | ICD-10-CM | POA: Diagnosis not present

## 2021-01-24 DIAGNOSIS — M7061 Trochanteric bursitis, right hip: Secondary | ICD-10-CM | POA: Diagnosis not present

## 2021-01-28 DIAGNOSIS — M25551 Pain in right hip: Secondary | ICD-10-CM | POA: Diagnosis not present

## 2021-01-28 DIAGNOSIS — M7061 Trochanteric bursitis, right hip: Secondary | ICD-10-CM | POA: Diagnosis not present

## 2021-02-06 DIAGNOSIS — M25551 Pain in right hip: Secondary | ICD-10-CM | POA: Diagnosis not present

## 2021-02-06 DIAGNOSIS — M7061 Trochanteric bursitis, right hip: Secondary | ICD-10-CM | POA: Diagnosis not present

## 2021-02-07 ENCOUNTER — Other Ambulatory Visit: Payer: Self-pay

## 2021-02-07 ENCOUNTER — Ambulatory Visit
Admission: RE | Admit: 2021-02-07 | Discharge: 2021-02-07 | Disposition: A | Discharge status: Home / Self Care | Payer: Medicare Other | Source: Ambulatory Visit | Attending: Sports Medicine | Admitting: Sports Medicine

## 2021-02-07 DIAGNOSIS — M2578 Osteophyte, vertebrae: Secondary | ICD-10-CM | POA: Diagnosis not present

## 2021-02-07 DIAGNOSIS — M545 Low back pain, unspecified: Secondary | ICD-10-CM | POA: Diagnosis not present

## 2021-02-07 DIAGNOSIS — M5441 Lumbago with sciatica, right side: Secondary | ICD-10-CM

## 2021-02-07 DIAGNOSIS — M5126 Other intervertebral disc displacement, lumbar region: Secondary | ICD-10-CM | POA: Diagnosis not present

## 2021-02-07 DIAGNOSIS — M48061 Spinal stenosis, lumbar region without neurogenic claudication: Secondary | ICD-10-CM | POA: Diagnosis not present

## 2021-02-07 IMAGING — MR MR LUMBAR SPINE W/O CM
4 of 5 series · 27 of 48 positions shown · non-contrast
Comparison: None

CLINICAL DATA: Acute right-sided low back pain with right-sided
sciatica; technologist note states low back pain with right hip and
leg pain

EXAM:
MRI LUMBAR SPINE WITHOUT CONTRAST
TECHNIQUE: Multiplanar, multisequence MR imaging of the lumbar spine was
performed. No intravenous contrast was administered.

[Series 2: T2 · sagittal · 4.0mm · 1.09mm/px · 6 of 17 slices shown (1 of 2)]
[im 1/17]
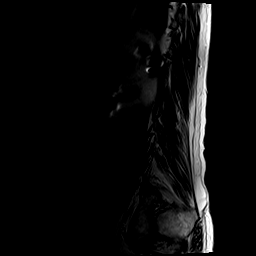
[im 4/17]
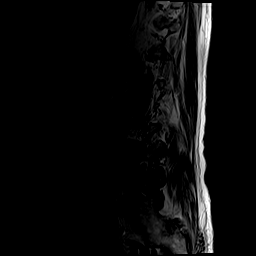
[im 7/17]
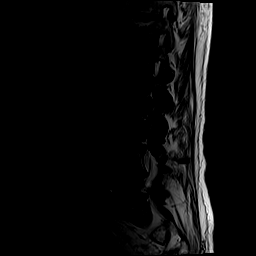
[im 10/17]
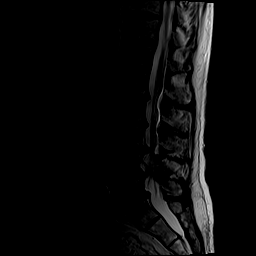
[im 13/17]
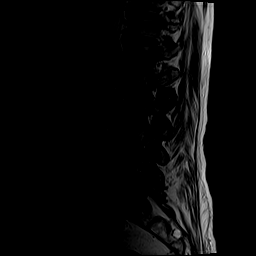
[im 17/17]
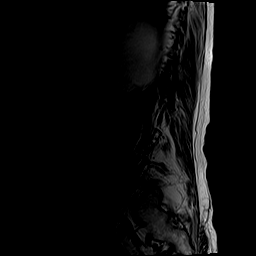

[Series 4: T1 · sagittal · 4.0mm · 1.09mm/px · 7 of 17 slices shown (1 of 2)]
[im 1/17]
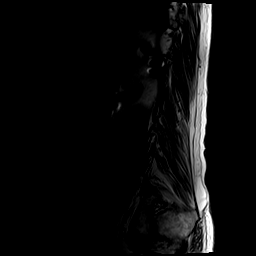
[im 3/17]
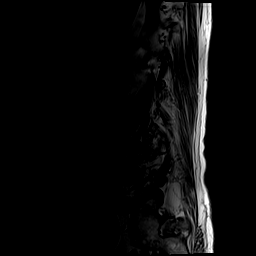
[im 6/17]
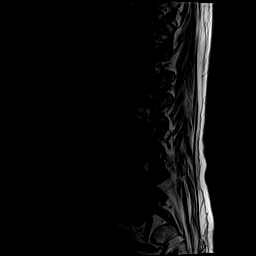
[im 9/17]
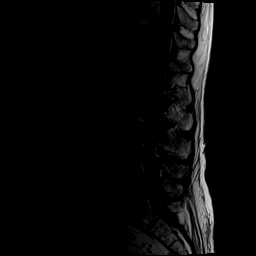
[im 11/17]
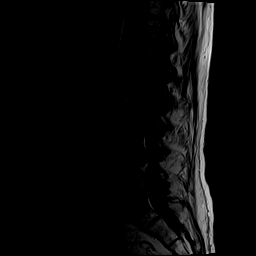
[im 14/17]
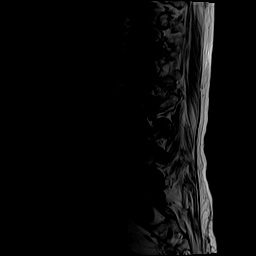
[im 17/17]
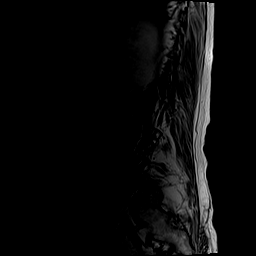

[Series 5: T2 · axial · 4.0mm · 0.39mm/px · z∈[-101,+82]mm · 8 of 37 slices shown (2 of 2)]
[im 1/37]
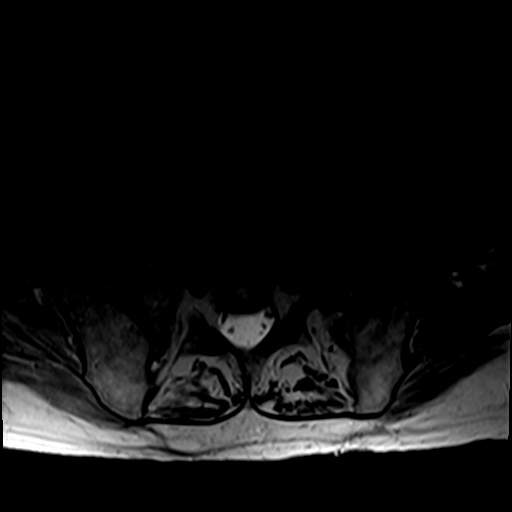
[im 6/37]
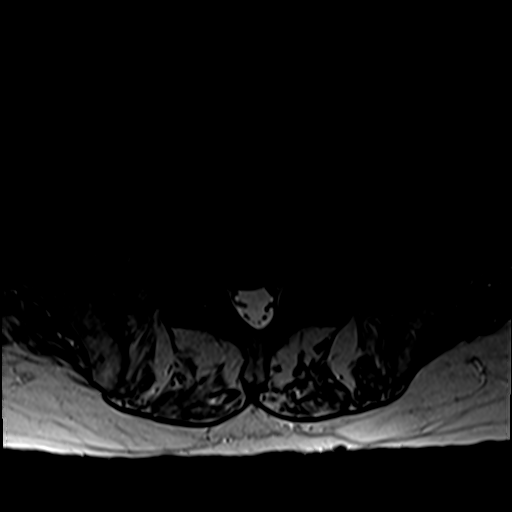
[im 12/37]
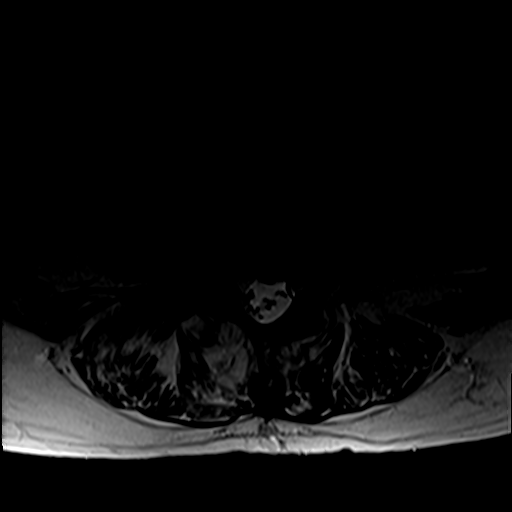
[im 17/37]
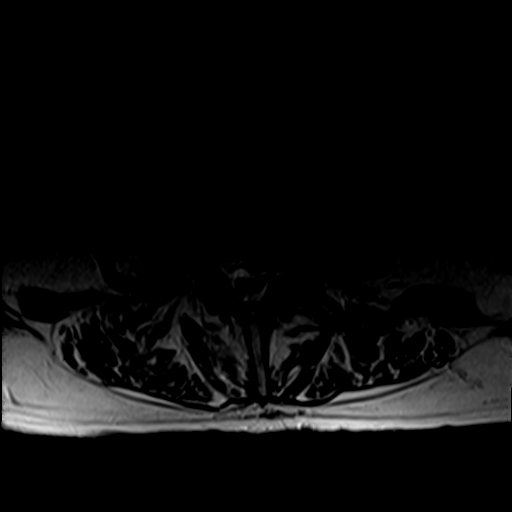
[im 20/37]
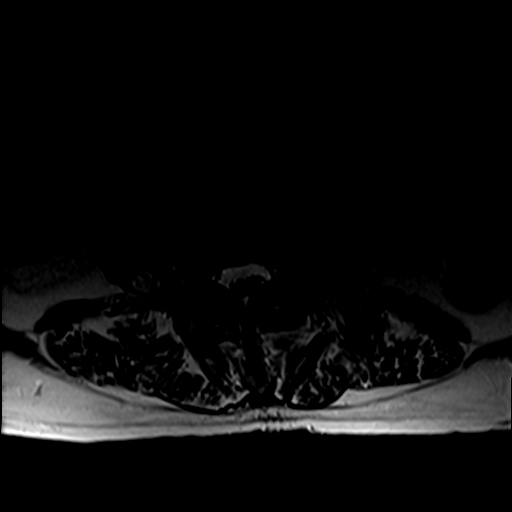
[im 25/37]
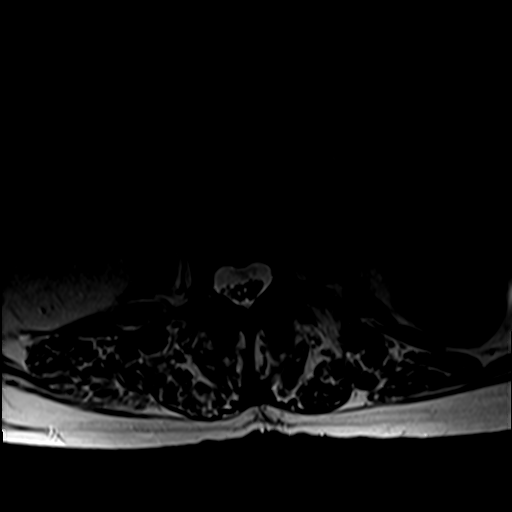
[im 31/37]
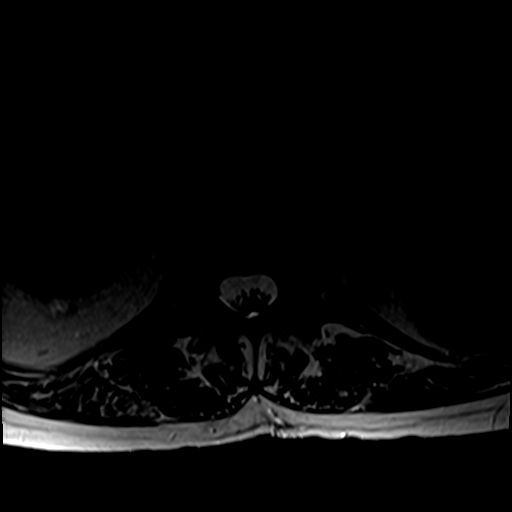
[im 37/37]
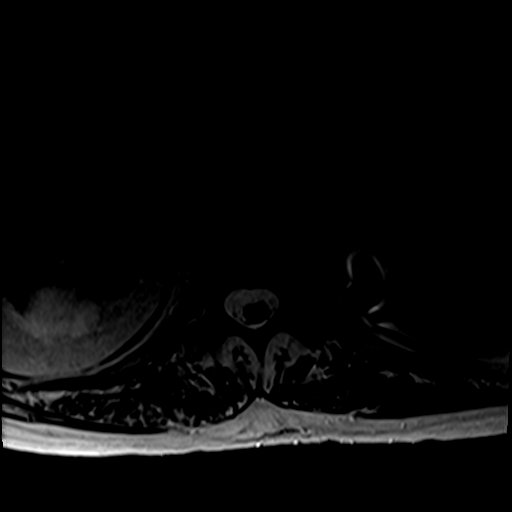

[Series 6: T1 · axial · 4.0mm · 0.39mm/px · z∈[-101,+54]mm · 6 of 37 slices shown (2 of 2)]
[im 1/37]
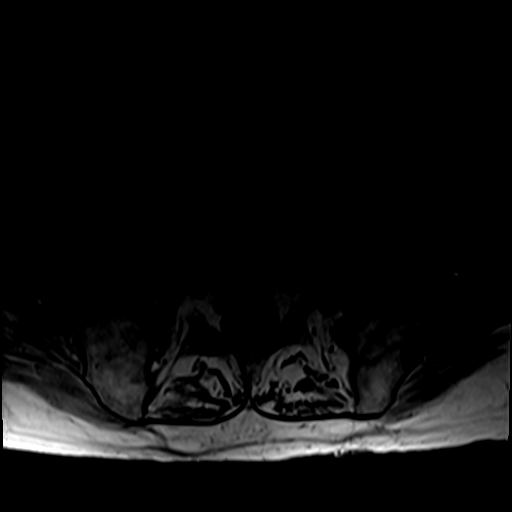
[im 6/37]
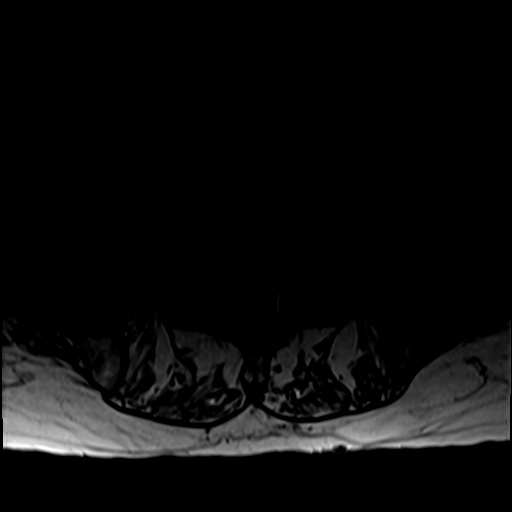
[im 12/37]
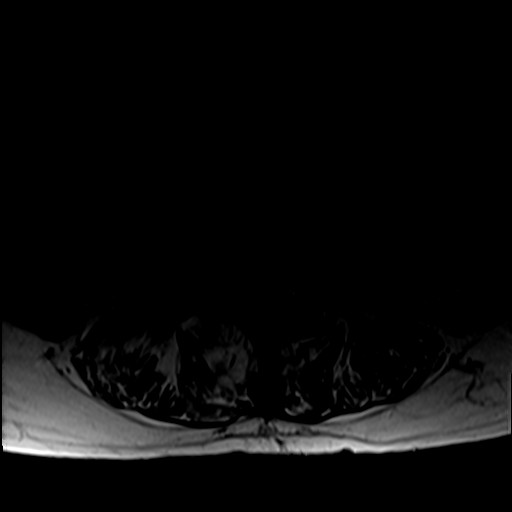
[im 17/37]
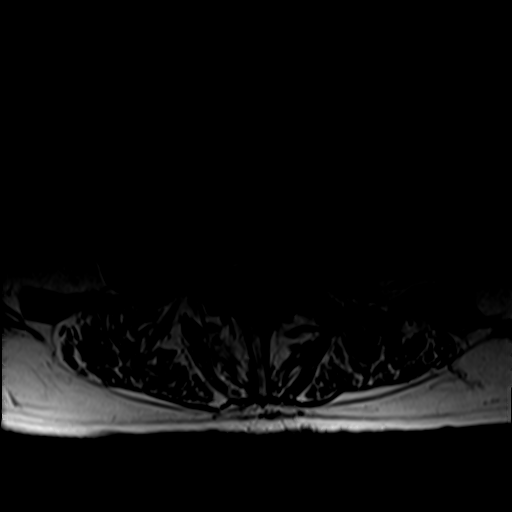
[im 20/37]
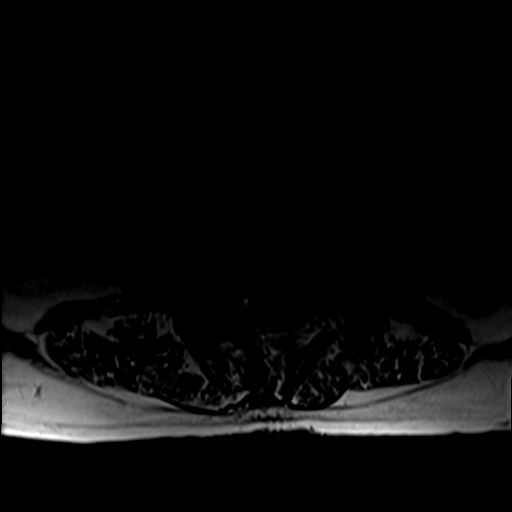
[im 31/37]
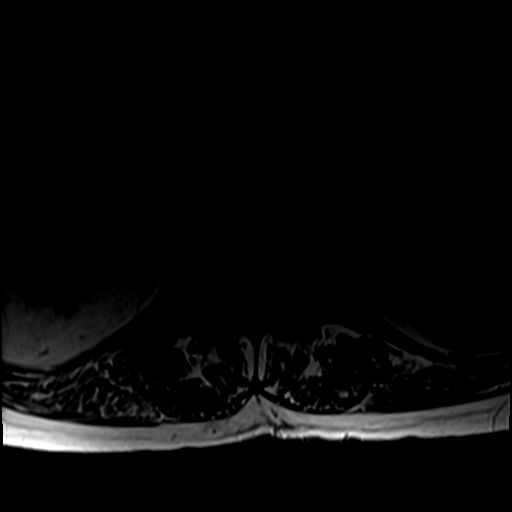

[27 of 48 positions shown; findings below may reference images not displayed]

FINDINGS: Segmentation:  Standard.

Alignment:  No significant listhesis.

Vertebrae: Multilevel degenerative endplate irregularity. Lumbar
vertebral body heights are otherwise maintained. Chronic minor loss
of height at the superior endplate of T11 on the left. Degenerative
endplate marrow changes are present without substantial edema. No
suspicious osseous lesion.

Conus medullaris and cauda equina: Conus extends to the L1 level.
Conus and cauda equina appear normal.

Paraspinal and other soft tissues: Unremarkable.

Disc levels:

L1-L2: Disc bulge with endplate osteophytic ridging. Facet
arthropathy with ligamentum flavum infolding. Minor canal stenosis.
No right foraminal stenosis. Mild left foraminal stenosis.

L2-L3: Disc bulge with endplate osteophytic ridging. Facet
arthropathy with ligamentum flavum infolding. Minor canal stenosis.
Slight effacement of subarticular recesses. No right foraminal
stenosis. Mild to moderate left foraminal stenosis.

L3-L4: Disc bulge with endplate osteophytic ridging eccentric to the
left. Facet arthropathy with ligamentum flavum infolding. Moderate
canal stenosis. Effacement of left greater than right subarticular
recesses. Mild to moderate foraminal stenosis.

L4-L5: Disc bulge with endplate osteophytic ridging eccentric to the
right. Facet arthropathy with ligamentum flavum infolding. No canal
stenosis. Partial effacement of right greater than left subarticular
recesses. Mild to moderate right and mild left foraminal stenosis.

L5-S1: Disc bulge with endplate osteophytic ridging. Facet
arthropathy. No canal stenosis. Moderate foraminal stenosis, right
greater than left.
IMPRESSION: Multilevel degenerative changes as detailed above. Canal narrowing
is greatest at L3-L4. Right foraminal narrowing is greatest at
L5-S1.

## 2021-02-08 DIAGNOSIS — M25551 Pain in right hip: Secondary | ICD-10-CM | POA: Diagnosis not present

## 2021-02-08 DIAGNOSIS — M7061 Trochanteric bursitis, right hip: Secondary | ICD-10-CM | POA: Diagnosis not present

## 2021-02-11 DIAGNOSIS — M25551 Pain in right hip: Secondary | ICD-10-CM | POA: Diagnosis not present

## 2021-02-11 DIAGNOSIS — M7061 Trochanteric bursitis, right hip: Secondary | ICD-10-CM | POA: Diagnosis not present

## 2021-02-14 DIAGNOSIS — M25551 Pain in right hip: Secondary | ICD-10-CM | POA: Diagnosis not present

## 2021-02-14 DIAGNOSIS — M7061 Trochanteric bursitis, right hip: Secondary | ICD-10-CM | POA: Diagnosis not present

## 2021-02-19 DIAGNOSIS — M7061 Trochanteric bursitis, right hip: Secondary | ICD-10-CM | POA: Diagnosis not present

## 2021-02-19 DIAGNOSIS — M25551 Pain in right hip: Secondary | ICD-10-CM | POA: Diagnosis not present

## 2021-02-20 DIAGNOSIS — M25551 Pain in right hip: Secondary | ICD-10-CM | POA: Diagnosis not present

## 2021-02-26 ENCOUNTER — Other Ambulatory Visit: Payer: Self-pay | Admitting: Sports Medicine

## 2021-02-26 DIAGNOSIS — M25551 Pain in right hip: Secondary | ICD-10-CM

## 2021-03-18 ENCOUNTER — Other Ambulatory Visit: Payer: Self-pay

## 2021-03-18 ENCOUNTER — Ambulatory Visit
Admission: RE | Admit: 2021-03-18 | Discharge: 2021-03-18 | Disposition: A | Discharge status: Home / Self Care | Payer: Medicare Other | Source: Ambulatory Visit | Attending: Sports Medicine | Admitting: Sports Medicine

## 2021-03-18 DIAGNOSIS — M7061 Trochanteric bursitis, right hip: Secondary | ICD-10-CM | POA: Diagnosis not present

## 2021-03-18 DIAGNOSIS — S76311A Strain of muscle, fascia and tendon of the posterior muscle group at thigh level, right thigh, initial encounter: Secondary | ICD-10-CM | POA: Diagnosis not present

## 2021-03-18 DIAGNOSIS — R531 Weakness: Secondary | ICD-10-CM | POA: Diagnosis not present

## 2021-03-18 DIAGNOSIS — M1611 Unilateral primary osteoarthritis, right hip: Secondary | ICD-10-CM | POA: Diagnosis not present

## 2021-03-18 DIAGNOSIS — M25551 Pain in right hip: Secondary | ICD-10-CM

## 2021-03-20 ENCOUNTER — Other Ambulatory Visit: Payer: Self-pay | Admitting: Sports Medicine

## 2021-03-20 DIAGNOSIS — R1900 Intra-abdominal and pelvic swelling, mass and lump, unspecified site: Secondary | ICD-10-CM

## 2021-03-27 ENCOUNTER — Other Ambulatory Visit: Payer: Self-pay | Admitting: Internal Medicine

## 2021-03-27 DIAGNOSIS — R1900 Intra-abdominal and pelvic swelling, mass and lump, unspecified site: Secondary | ICD-10-CM

## 2021-04-03 DIAGNOSIS — M1611 Unilateral primary osteoarthritis, right hip: Secondary | ICD-10-CM | POA: Diagnosis not present

## 2021-04-03 DIAGNOSIS — Z7189 Other specified counseling: Secondary | ICD-10-CM | POA: Diagnosis not present

## 2021-04-03 DIAGNOSIS — I129 Hypertensive chronic kidney disease with stage 1 through stage 4 chronic kidney disease, or unspecified chronic kidney disease: Secondary | ICD-10-CM | POA: Diagnosis not present

## 2021-04-03 DIAGNOSIS — M109 Gout, unspecified: Secondary | ICD-10-CM | POA: Diagnosis not present

## 2021-04-03 DIAGNOSIS — G894 Chronic pain syndrome: Secondary | ICD-10-CM | POA: Diagnosis not present

## 2021-04-11 DIAGNOSIS — Z7189 Other specified counseling: Secondary | ICD-10-CM | POA: Diagnosis not present

## 2021-04-11 DIAGNOSIS — Z85828 Personal history of other malignant neoplasm of skin: Secondary | ICD-10-CM | POA: Diagnosis not present

## 2021-04-11 DIAGNOSIS — Z0000 Encounter for general adult medical examination without abnormal findings: Secondary | ICD-10-CM | POA: Diagnosis not present

## 2021-04-11 DIAGNOSIS — D485 Neoplasm of uncertain behavior of skin: Secondary | ICD-10-CM | POA: Diagnosis not present

## 2021-04-11 DIAGNOSIS — L570 Actinic keratosis: Secondary | ICD-10-CM | POA: Diagnosis not present

## 2021-04-11 DIAGNOSIS — L821 Other seborrheic keratosis: Secondary | ICD-10-CM | POA: Diagnosis not present

## 2021-04-11 DIAGNOSIS — L812 Freckles: Secondary | ICD-10-CM | POA: Diagnosis not present

## 2021-04-11 DIAGNOSIS — C44329 Squamous cell carcinoma of skin of other parts of face: Secondary | ICD-10-CM | POA: Diagnosis not present

## 2021-04-14 ENCOUNTER — Other Ambulatory Visit: Payer: Self-pay

## 2021-04-14 ENCOUNTER — Ambulatory Visit
Admission: RE | Admit: 2021-04-14 | Discharge: 2021-04-14 | Disposition: A | Discharge status: Home / Self Care | Payer: Medicare Other | Source: Ambulatory Visit | Attending: Internal Medicine | Admitting: Internal Medicine

## 2021-04-14 DIAGNOSIS — R1900 Intra-abdominal and pelvic swelling, mass and lump, unspecified site: Secondary | ICD-10-CM

## 2021-04-14 DIAGNOSIS — N281 Cyst of kidney, acquired: Secondary | ICD-10-CM | POA: Diagnosis not present

## 2021-04-14 DIAGNOSIS — N2889 Other specified disorders of kidney and ureter: Secondary | ICD-10-CM | POA: Diagnosis not present

## 2021-04-14 DIAGNOSIS — Z905 Acquired absence of kidney: Secondary | ICD-10-CM | POA: Diagnosis not present

## 2021-04-14 IMAGING — MR MR ABDOMEN WO/W CM
9 of 17 series · 24 of 48 positions shown · IV contrast (15ml Multihance)
Comparison: MR right hip, [DATE]

CLINICAL DATA: Right renal lesion incidentally identified by prior
MR examination of the hip

EXAM:
MRI ABDOMEN WITHOUT AND WITH CONTRAST
TECHNIQUE: Multiplanar multisequence MR imaging of the abdomen was performed
both before and after the administration of intravenous contrast.
CONTRAST:  15mL MULTIHANCE GADOBENATE DIMEGLUMINE 529 MG/ML IV SOLN

[Series 3: cor haste · coronal · 5.5mm · 0.88mm/px · 2 of 38 slices shown]
[im 1/38]
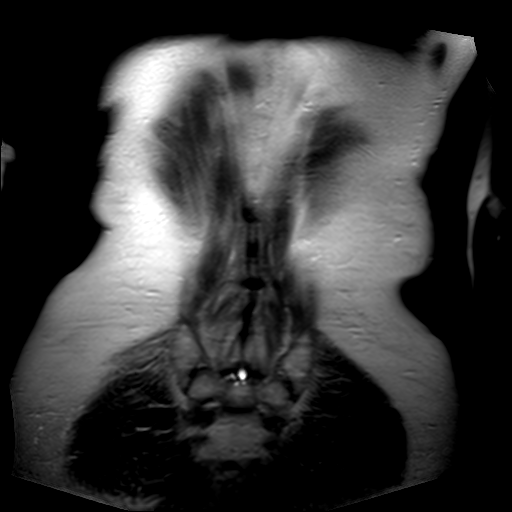
[im 38/38]
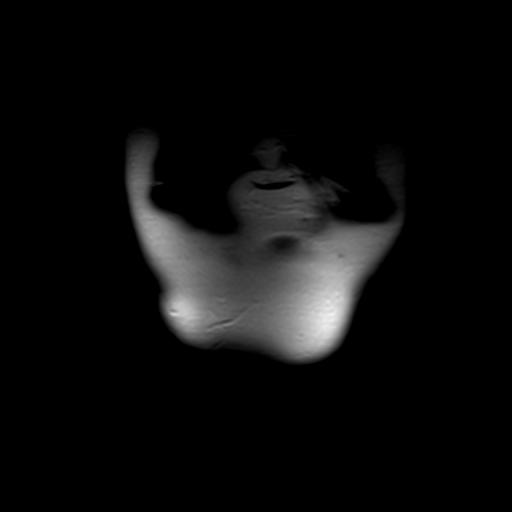

[Series 4: axial haste · axial · 6.5mm · 0.88mm/px · z∈[-117,+133]mm · 2 of 36 slices shown]
[im 1/36]
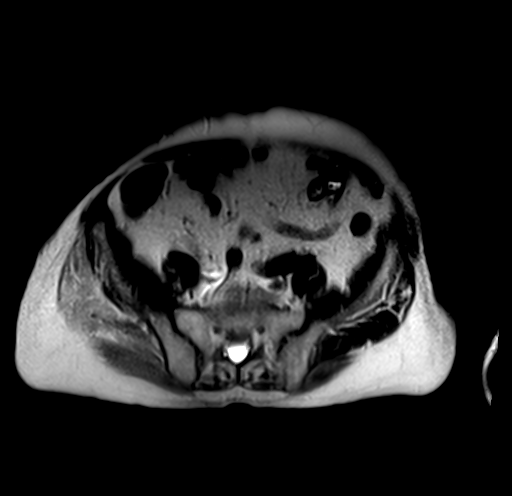
[im 36/36]
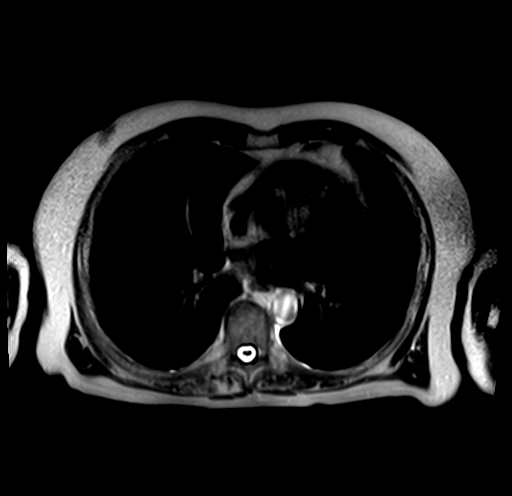

[Series 5: T1 · axial · 6.5mm · 0.88mm/px · z∈[-117,+133]mm · 4 of 72 slices shown]
[im 1/72]
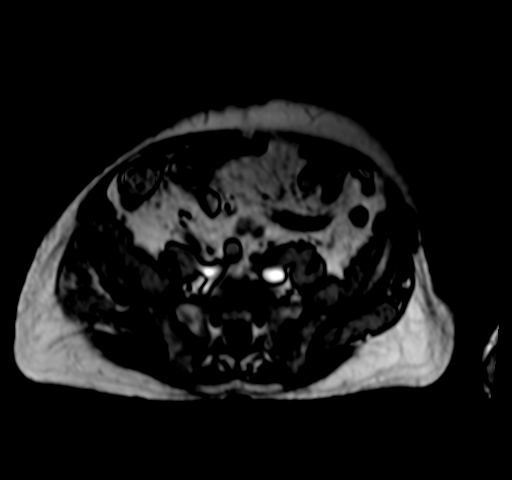
[im 24/72]
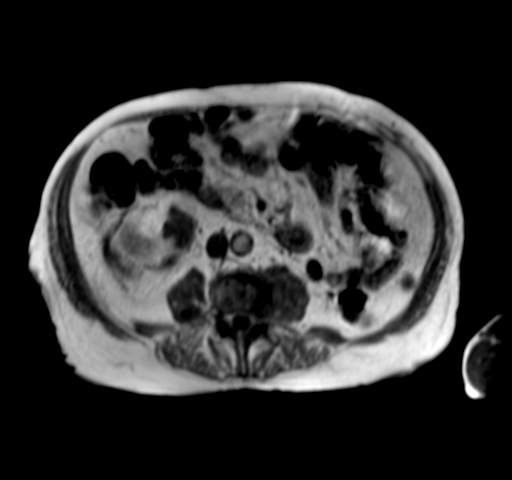
[im 48/72]
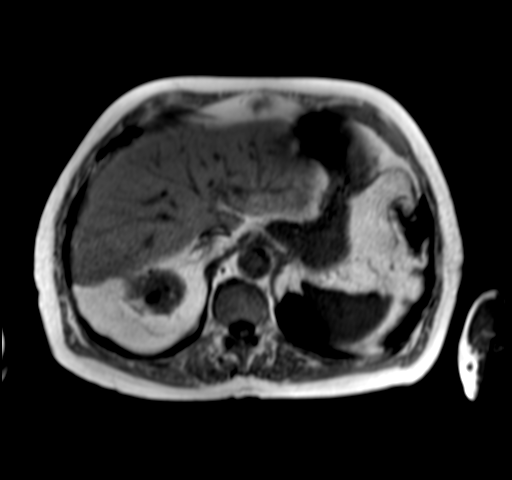
[im 72/72]
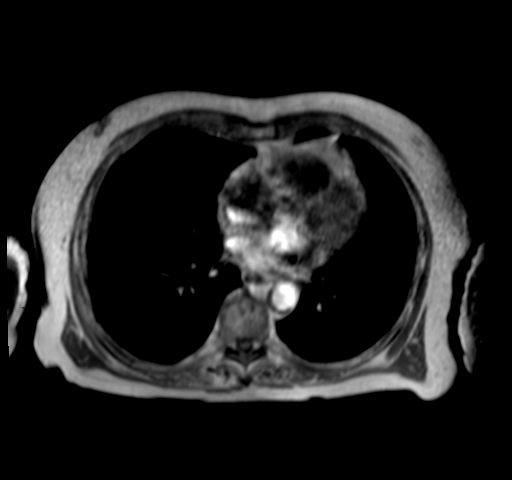

[Series 6: bSSFP · axial · 4.5mm · 0.88mm/px · z∈[-120,+136]mm · 3 of 58 slices shown]
[im 1/58]
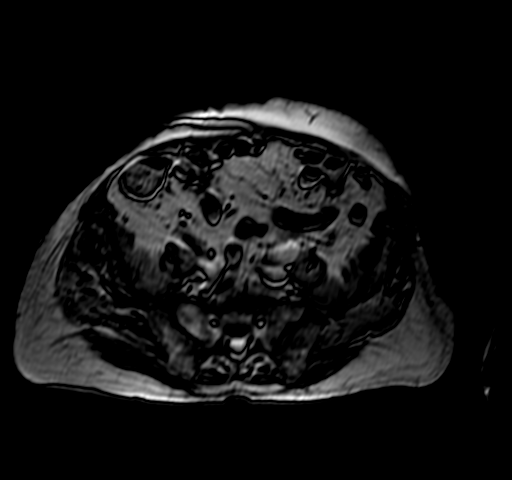
[im 29/58]
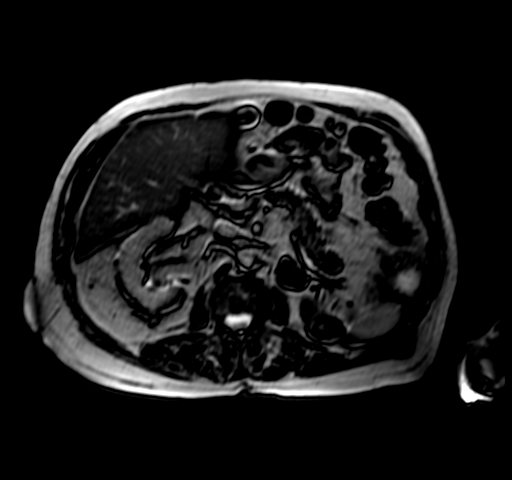
[im 58/58]
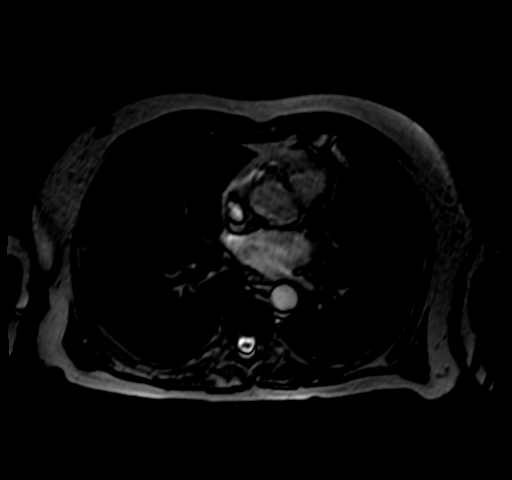

[Series 7: T2 fat-sat · axial · 6.5mm · 1.41mm/px · z∈[-103,+185]mm · 2 of 38 slices shown]
[im 1/38]
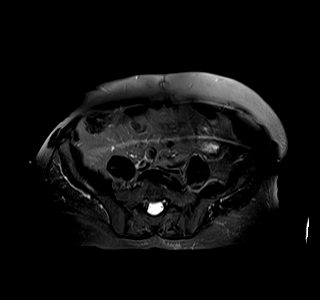
[im 38/38]
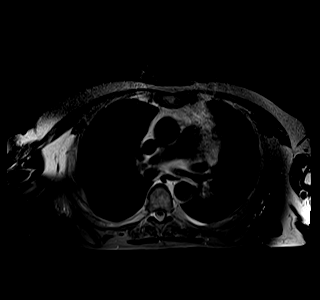

[Series 8: ep2d_diff_b50_500_800_p2_trig · axial · 6.5mm · 2.34mm/px · z∈[-103,+185]mm · 4 of 114 slices shown]
[im 1/114]
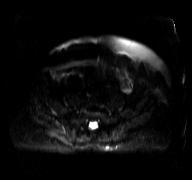
[im 38/114]
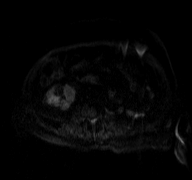
[im 76/114]
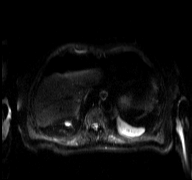
[im 114/114]
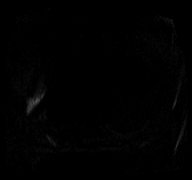

[Series 9: ep2d_diff_b50_500_800_p2_trig_adc · axial · 6.5mm · 2.34mm/px · 1 of 38 slices shown]
[im 1/38]
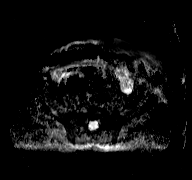

[Series 10: T1 dynamic · axial · non-contrast · 3.0mm · 0.88mm/px · z∈[-123,+138]mm · 3 of 88 slices shown]
[im 1/88]
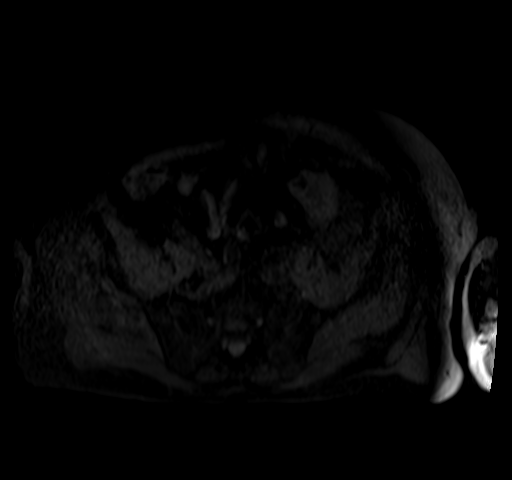
[im 44/88]
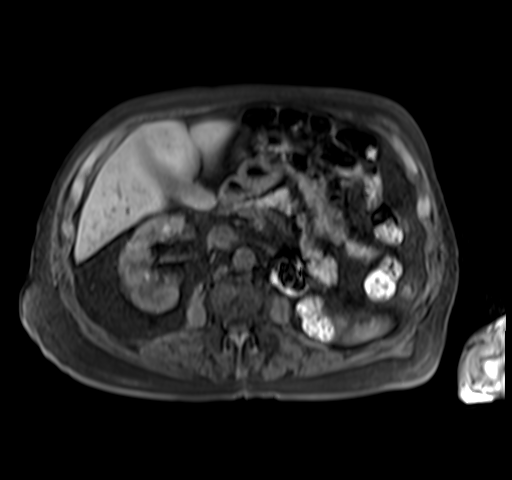
[im 88/88]
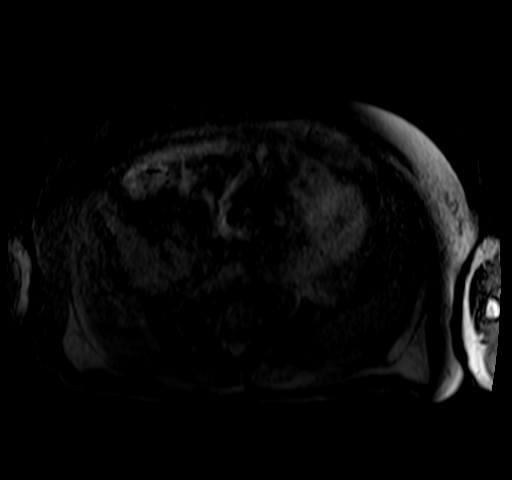

[Series 11: T1 dynamic post-contrast · axial · 3.0mm · 0.88mm/px · z∈[-123,+138]mm · 3 of 88 slices shown]
[im 1/88]
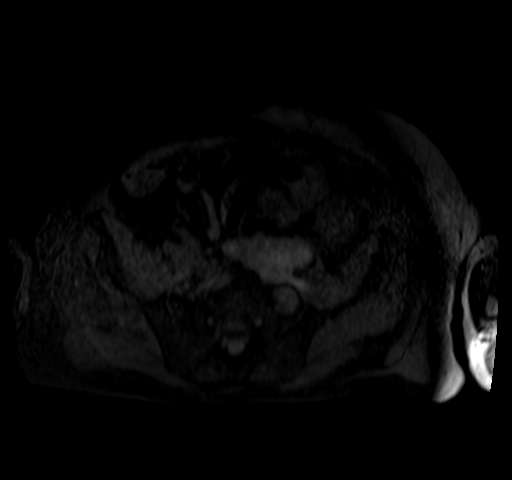
[im 44/88]
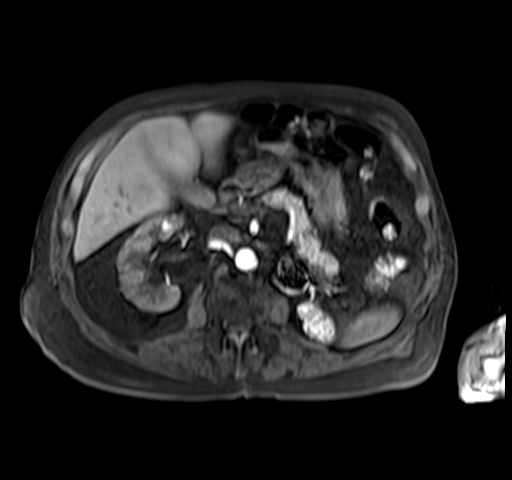
[im 88/88]
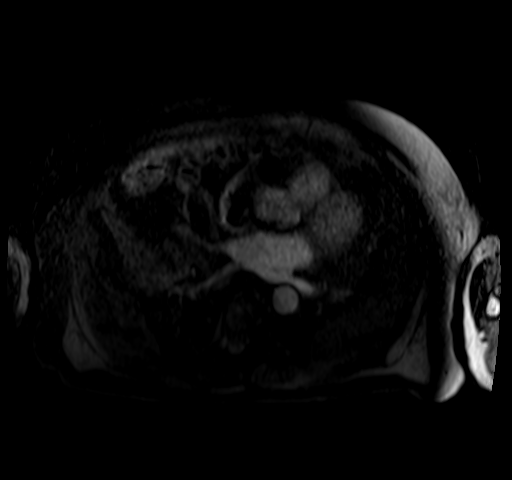

[24 of 48 positions shown; findings below may reference images not displayed]

FINDINGS: Lower chest: No acute findings.

Hepatobiliary: Scattered small hepatic cysts or hemangiomata. No
mass or other parenchymal abnormality identified. No gallstones. No
biliary ductal dilatation.

Pancreas: No mass, inflammatory changes, or other parenchymal
abnormality identified.No pancreatic ductal dilatation.

Spleen:  Within normal limits in size and appearance.

Adrenals/Urinary Tract: Normal adrenal glands. Status post left
nephrectomy. Numerous cystic lesions of the right kidney with
varying intrinsic signal, several of which are hemorrhagic or
proteinaceous. There is no associated suspicious contrast
enhancement. At the inferior pole of the right kidney, there is a
large, intrinsically T1 hyperintense, T2 heterogeneous exophytic
lesion measuring at least 8.0 x 6.7 cm, with no associated contrast
enhancement (series 10, image 65, series 14, image 66).

Stomach/Bowel: Visualized portions within the abdomen are
unremarkable.

Vascular/Lymphatic: No pathologically enlarged lymph nodes
identified. No abdominal aortic aneurysm demonstrated.

Other:  None.

Musculoskeletal: No suspicious osseous lesions identified. There is
again seen an oblong, contrast enhancing lesion within the right
psoas muscle body measuring approximately 2.1 x 0.9 cm (series 3,
image 8, series 17, image 18).
IMPRESSION: 1. At the inferior pole of the right kidney, there is a large,
intrinsically T1 hyperintense, T2 heterogeneous exophytic lesion
measuring at least 8.0 x 6.7 cm, with no associated contrast
enhancement. This is consistent with a benign hemorrhagic or
proteinaceous cyst.
2. Numerous additional cystic lesions of the right kidney, several
of which are hemorrhagic or proteinaceous without associated
suspicious contrast enhancement.
3. There is an oblong, contrast enhancing lesion within the right
psoas muscle body measuring approximately 2.1 x 0.9 cm, likely a
small intramuscular schwannoma.
4. Status post left nephrectomy.

## 2021-04-14 MED ORDER — GADOBENATE DIMEGLUMINE 529 MG/ML IV SOLN
15.0000 mL | Freq: Once | INTRAVENOUS | Status: AC | PRN
  Administered 2021-04-14: 15 mL via INTRAVENOUS

## 2021-04-19 DIAGNOSIS — D49511 Neoplasm of unspecified behavior of right kidney: Secondary | ICD-10-CM | POA: Diagnosis not present

## 2021-04-24 DIAGNOSIS — M25551 Pain in right hip: Secondary | ICD-10-CM | POA: Diagnosis not present

## 2021-04-24 DIAGNOSIS — M7061 Trochanteric bursitis, right hip: Secondary | ICD-10-CM | POA: Diagnosis not present

## 2021-04-24 DIAGNOSIS — M1611 Unilateral primary osteoarthritis, right hip: Secondary | ICD-10-CM | POA: Diagnosis not present

## 2021-05-14 DIAGNOSIS — C44329 Squamous cell carcinoma of skin of other parts of face: Secondary | ICD-10-CM | POA: Diagnosis not present

## 2021-05-15 DIAGNOSIS — I10 Essential (primary) hypertension: Secondary | ICD-10-CM | POA: Diagnosis not present

## 2021-06-03 DIAGNOSIS — H524 Presbyopia: Secondary | ICD-10-CM | POA: Diagnosis not present

## 2021-06-03 DIAGNOSIS — H349 Unspecified retinal vascular occlusion: Secondary | ICD-10-CM | POA: Diagnosis not present

## 2021-06-05 ENCOUNTER — Ambulatory Visit (INDEPENDENT_AMBULATORY_CARE_PROVIDER_SITE_OTHER): Payer: Medicare Other

## 2021-06-05 ENCOUNTER — Encounter: Payer: Self-pay | Admitting: Podiatry

## 2021-06-05 ENCOUNTER — Ambulatory Visit: Payer: Medicare Other | Admitting: Podiatry

## 2021-06-05 ENCOUNTER — Other Ambulatory Visit: Payer: Self-pay

## 2021-06-05 DIAGNOSIS — M2041 Other hammer toe(s) (acquired), right foot: Secondary | ICD-10-CM | POA: Diagnosis not present

## 2021-06-05 DIAGNOSIS — S90821A Blister (nonthermal), right foot, initial encounter: Secondary | ICD-10-CM

## 2021-06-05 DIAGNOSIS — L089 Local infection of the skin and subcutaneous tissue, unspecified: Secondary | ICD-10-CM

## 2021-06-05 NOTE — Progress Notes (Signed)
Subjective:  ? ?Patient ID: Glenn Carol., male   DOB: 88 y.o.   MRN: [id number]  ? ?HPI ?Patient presents stating he is concerned about the second digit right foot as it is increasingly difficult to wear shoe gear comfortably and he has had problems with his leg and has been diagnosed with chronic pain syndrome ? ? ?ROS ? ? ?   ?Objective:  ?Physical Exam  ?Neurovascular status was found to be intact muscle strength found to be adequate range of motion adequate subtalar midtarsal joint.  Second toe right is rigidly contracted its inflamed dorsally and on the medial side of the distal portion of the digit with no other pathology noted ? ?   ?Assessment:  ?Significant digital deformity digit to right with abnormal appearance of the toe and lesion formation ? ?   ?Plan:  ?H&P x-ray reviewed and I recommended cushioning for the toe considerations for digital fusion educating him on the procedure but at this point I would like to be able to avoid that if we can.  Patient will be seen back as needed encouraged to call questions concerns which may arise ? ?X-rays indicate rigid contracture digit to right with no other significant pathology ?   ? ? ?

## 2021-06-11 ENCOUNTER — Other Ambulatory Visit: Payer: Self-pay | Admitting: Podiatry

## 2021-06-11 DIAGNOSIS — M2041 Other hammer toe(s) (acquired), right foot: Secondary | ICD-10-CM

## 2021-06-19 DIAGNOSIS — M7061 Trochanteric bursitis, right hip: Secondary | ICD-10-CM | POA: Diagnosis not present

## 2021-07-04 DIAGNOSIS — H349 Unspecified retinal vascular occlusion: Secondary | ICD-10-CM | POA: Diagnosis not present

## 2021-07-09 DIAGNOSIS — R5383 Other fatigue: Secondary | ICD-10-CM | POA: Diagnosis not present

## 2021-07-22 DIAGNOSIS — L570 Actinic keratosis: Secondary | ICD-10-CM | POA: Diagnosis not present

## 2021-07-22 DIAGNOSIS — C44319 Basal cell carcinoma of skin of other parts of face: Secondary | ICD-10-CM | POA: Diagnosis not present

## 2021-08-06 DIAGNOSIS — H349 Unspecified retinal vascular occlusion: Secondary | ICD-10-CM | POA: Diagnosis not present

## 2021-09-06 DIAGNOSIS — H349 Unspecified retinal vascular occlusion: Secondary | ICD-10-CM | POA: Diagnosis not present

## 2021-09-09 DIAGNOSIS — R5383 Other fatigue: Secondary | ICD-10-CM | POA: Diagnosis not present

## 2021-09-30 DIAGNOSIS — R609 Edema, unspecified: Secondary | ICD-10-CM | POA: Diagnosis not present

## 2021-09-30 DIAGNOSIS — I129 Hypertensive chronic kidney disease with stage 1 through stage 4 chronic kidney disease, or unspecified chronic kidney disease: Secondary | ICD-10-CM | POA: Diagnosis not present

## 2021-09-30 DIAGNOSIS — M109 Gout, unspecified: Secondary | ICD-10-CM | POA: Diagnosis not present

## 2021-09-30 DIAGNOSIS — M1611 Unilateral primary osteoarthritis, right hip: Secondary | ICD-10-CM | POA: Diagnosis not present

## 2021-09-30 DIAGNOSIS — N1831 Chronic kidney disease, stage 3a: Secondary | ICD-10-CM | POA: Diagnosis not present

## 2021-09-30 DIAGNOSIS — I2510 Atherosclerotic heart disease of native coronary artery without angina pectoris: Secondary | ICD-10-CM | POA: Diagnosis not present

## 2021-10-04 DIAGNOSIS — H04122 Dry eye syndrome of left lacrimal gland: Secondary | ICD-10-CM | POA: Diagnosis not present

## 2021-10-10 DIAGNOSIS — L812 Freckles: Secondary | ICD-10-CM | POA: Diagnosis not present

## 2021-10-10 DIAGNOSIS — L821 Other seborrheic keratosis: Secondary | ICD-10-CM | POA: Diagnosis not present

## 2021-10-10 DIAGNOSIS — L820 Inflamed seborrheic keratosis: Secondary | ICD-10-CM | POA: Diagnosis not present

## 2021-10-10 DIAGNOSIS — L570 Actinic keratosis: Secondary | ICD-10-CM | POA: Diagnosis not present

## 2021-10-10 DIAGNOSIS — D485 Neoplasm of uncertain behavior of skin: Secondary | ICD-10-CM | POA: Diagnosis not present

## 2021-10-10 DIAGNOSIS — C44729 Squamous cell carcinoma of skin of left lower limb, including hip: Secondary | ICD-10-CM | POA: Diagnosis not present

## 2021-10-10 DIAGNOSIS — D045 Carcinoma in situ of skin of trunk: Secondary | ICD-10-CM | POA: Diagnosis not present

## 2021-10-10 DIAGNOSIS — Z85828 Personal history of other malignant neoplasm of skin: Secondary | ICD-10-CM | POA: Diagnosis not present

## 2021-10-10 DIAGNOSIS — D692 Other nonthrombocytopenic purpura: Secondary | ICD-10-CM | POA: Diagnosis not present

## 2021-10-10 DIAGNOSIS — D1801 Hemangioma of skin and subcutaneous tissue: Secondary | ICD-10-CM | POA: Diagnosis not present

## 2021-10-10 DIAGNOSIS — L853 Xerosis cutis: Secondary | ICD-10-CM | POA: Diagnosis not present

## 2021-10-28 DIAGNOSIS — L03115 Cellulitis of right lower limb: Secondary | ICD-10-CM | POA: Diagnosis not present

## 2021-11-05 DIAGNOSIS — G894 Chronic pain syndrome: Secondary | ICD-10-CM | POA: Diagnosis not present

## 2021-11-05 DIAGNOSIS — G3184 Mild cognitive impairment, so stated: Secondary | ICD-10-CM | POA: Diagnosis not present

## 2021-11-05 DIAGNOSIS — I129 Hypertensive chronic kidney disease with stage 1 through stage 4 chronic kidney disease, or unspecified chronic kidney disease: Secondary | ICD-10-CM | POA: Diagnosis not present

## 2021-11-05 DIAGNOSIS — G250 Essential tremor: Secondary | ICD-10-CM | POA: Diagnosis not present

## 2021-11-05 DIAGNOSIS — I10 Essential (primary) hypertension: Secondary | ICD-10-CM | POA: Diagnosis not present

## 2021-11-05 DIAGNOSIS — I25119 Atherosclerotic heart disease of native coronary artery with unspecified angina pectoris: Secondary | ICD-10-CM | POA: Diagnosis not present

## 2021-11-05 DIAGNOSIS — M109 Gout, unspecified: Secondary | ICD-10-CM | POA: Diagnosis not present

## 2021-11-05 DIAGNOSIS — G720 Drug-induced myopathy: Secondary | ICD-10-CM | POA: Diagnosis not present

## 2021-11-05 DIAGNOSIS — M179 Osteoarthritis of knee, unspecified: Secondary | ICD-10-CM | POA: Diagnosis not present

## 2021-12-19 DIAGNOSIS — J3089 Other allergic rhinitis: Secondary | ICD-10-CM | POA: Diagnosis not present

## 2021-12-19 DIAGNOSIS — R058 Other specified cough: Secondary | ICD-10-CM | POA: Diagnosis not present

## 2022-01-02 NOTE — Progress Notes (Signed)
[facility] Note  01/06/2022    CHIEF COMPLAINT Patient presents for Retina Evaluation  HISTORY OF PRESENT ILLNESS: Glenn Crumby. is a 88 y.o. male who presents to the clinic today for:   HPI     Retina Evaluation   This started 1 week ago.  Duration of 1 week.  Response to treatment was issue resolved.  I, the attending physician,  performed the HPI with the patient and updated documentation appropriately.        Comments   New pt referred by Dr. Valetta Close for ret heme, poss HRVO OS. Pt states about a week ago he noticed a little change in [place] in OS. Pt states its little to not noticeable.       Last edited by Bernarda Caffey, MD on 01/06/2022 12:06 PM.    Pt is here on the referral of Dr. Valetta Close for concern of HRVO OS, pt states last March, he went for an exam and Dr. Valetta Close noticed a "dimple" in his vision, he states they watched it every 6 weeks until his last appt when Dr. Valetta Close thought they needed to get a second opinion, pt states that he can't tell anything is wrong with his vision  Referring physician: Jola Schmidt, MD [place],  [place] [address]  HISTORICAL INFORMATION:   Selected notes from the MEDICAL RECORD NUMBER Referred by Dr. Valetta Close for HRVO LEE:  Ocular Hx- PMH-    CURRENT MEDICATIONS: No current outpatient medications on file. (Ophthalmic Drugs)   No current facility-administered medications for this visit. (Ophthalmic Drugs)   Current Outpatient Medications (Other)  Medication Sig   acetaminophen (TYLENOL) 500 MG tablet Take 1,000 mg by mouth every 8 (eight) hours as needed for moderate pain.   allopurinol (ZYLOPRIM) 300 MG tablet Take 300 mg by mouth daily.    ibuprofen (ADVIL) 200 MG tablet Take 600 mg by mouth every 8 (eight) hours as needed for moderate pain.   Multiple Vitamins-Minerals (MULTIVITAMIN ADULT PO) Take 1 tablet by mouth daily.   potassium chloride (KLOR-CON) 10 MEQ tablet Take 10 mEq by mouth  2 (two) times daily.   torsemide (DEMADEX) 10 MG tablet Take 5 mg by mouth daily as needed (fluid).   triamterene-hydrochlorothiazide (MAXZIDE-25) 37.5-25 MG tablet Take 1 tablet by mouth daily.   ondansetron (ZOFRAN ODT) 4 MG disintegrating tablet Take 1 tablet (4 mg total) by mouth every 8 (eight) hours as needed for nausea or vomiting. (Patient not taking: Reported on 01/06/2022)   No current facility-administered medications for this visit. (Other)   REVIEW OF SYSTEMS: ROS   Positive for: Musculoskeletal, Eyes Negative for: Constitutional, Gastrointestinal, Neurological, Skin, Genitourinary, HENT, Endocrine, Cardiovascular, Respiratory, Psychiatric, Allergic/Imm, Heme/Lymph Last edited by Kingsley Spittle, COT on 01/06/2022  9:31 AM.     ALLERGIES Allergies  Allergen Reactions   Famotidine Other (See Comments)   Tetracycline Hcl Other (See Comments)   Tetracyclines & Related Other (See Comments)    Infection of penis   PAST MEDICAL HISTORY Past Medical History:  Diagnosis Date   Anemia    Arthritis    CAD (coronary artery disease)    2007, inferior ischemia on nuclear scan, catheterization showed  60-70% ostial PDA, 90% distal RCA and a very large right coronary artery ( vision stent was placed 3.5 x 23 mm), 80-90% diagonal ( small vessel, wire could not be passed patient followed medically)   CAD (coronary artery disease)  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; 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 but considers himslef stable , he denies chest pain , headache, sob  , reports he does "heavy exercise " states " i work out with a trainer 2-3 times a week and go walking but i havent done much since the gyms closed "    Cancer of kidney ([place])    Creatinine elevation    Dieulafoy lesion of stomach    incidence of bleeds in 2015 and 2017 , required blood transfusion as bleed lef to a 4.5 hemoglobin   Gout    H/O right nephrectomy    History of blood transfusion     Hypertension    Skin cancer    skin , squamous cell    Tuberculosis 1962   Varicose veins of legs    bialtera; "ive had them about 5 years now and theyve never been a problem"    Past Surgical History:  Procedure Laterality Date   CATARACT EXTRACTION, BILATERAL  2017   COLONOSCOPY     CORONARY ANGIOPLASTY WITH STENT PLACEMENT  2006   ESOPHAGOGASTRODUODENOSCOPY  04/20/2012   Procedure: ESOPHAGOGASTRODUODENOSCOPY (EGD);  Surgeon: Wonda Horner, MD;  Location: [facility] ENDOSCOPY;  Service: Endoscopy;  Laterality: N/A;   NEPHRECTOMY Right    REPORTS AVG CREATININE 1.4 FOR THE LAST 20 YEARS    REVERSE SHOULDER ARTHROPLASTY Right 02/14/2020   Procedure: REVERSE SHOULDER ARTHROPLASTY;  Surgeon: [name], MD;  Location: [facility] ORS;  Service: Orthopedics;  Laterality: Right;  2.5 hrs   TOTAL KNEE ARTHROPLASTY Right 08/26/2018   Procedure: TOTAL KNEE ARTHROPLASTY;  Surgeon: Paralee Cancel, MD;  Location: [facility] ORS;  Service: Orthopedics;  Laterality: Right;  70 mins   FAMILY HISTORY History reviewed. No pertinent family history.  SOCIAL HISTORY Social History   Tobacco Use   Smoking status: Former    Types: Cigarettes    Quit date: 04/01/2021    Years since quitting: 0.7   Smokeless tobacco: Never   Tobacco comments:    7 cigarettes a day fo the last 10 years   Vaping Use   Vaping Use: Never used  Substance Use Topics   Alcohol use: Yes    Alcohol/week: 7.0 standard drinks of alcohol    Types: 7 Glasses of wine per week    Comment: daily    Drug use: No       OPHTHALMIC EXAM:  Base Eye Exam     Visual Acuity (Snellen - Linear)       Right Left   Dist cc 20/25 20/40 -2   Dist ph cc 20/20 -1 20/40 +1    Correction: Glasses         Tonometry (Tonopen, 9:47 AM)       Right Left   Pressure 14 16         Pupils       Dark Light Shape React APD   Right 4 3 Round Brisk None   Left 4 3 Round Sluggish None         Visual Fields (Counting fingers)       Left  Right    Full Full         Extraocular Movement       Right Left    Full, Ortho Full, Ortho         Neuro/Psych     Oriented x3: Yes   Mood/Affect: Normal  Dilation     Both eyes: 1.0% Mydriacyl, 2.5% Phenylephrine @ 9:48 AM           Slit Lamp and Fundus Exam     Slit Lamp Exam       Right Left   Lids/Lashes Dermatochalasis - upper lid Dermatochalasis - upper lid   Conjunctiva/Sclera Mild conjunctivochalasis Mild conjunctivochalasis   Cornea 1+ Punctate epithelial erosions 1+ Punctate epithelial erosions   Anterior Chamber deep and clear deep and clear   Iris Round and dilated Round and dilated   Lens Toric PC IOL in good position with marks at 0300 and 0900 Toric PC IOL in good position with marks at 0300 and 0900   Anterior Vitreous Vitreous syneresis Vitreous syneresis         Fundus Exam       Right Left   Disc mild Pallor, Sharp rim Pink and Sharp, +hyperemia, +heme superiorly   C/D Ratio 0.7 0.6   Macula Flat, Blunted foveal reflex, RPE mottling and clumping, No heme or edema Blunted foveal reflex, confluent IRH superior macula, +CWS, +flame hemes   Vessels mild attenuation, mild tortuosity attenuated, Tortuous, superior HRVO   Periphery Attached Attached, superior DBH, pigmented paving stone degeneration inferiorly           Refraction     Wearing Rx       Sphere Cylinder Axis Add   Right -1.00 +0.75 014 +2.75   Left -1.00 +1.50 148 +2.75         Manifest Refraction       Sphere Cylinder Axis Dist VA   Right       Left -1.25 +1.50 160 20/40-1           IMAGING AND PROCEDURES  Imaging and Procedures for 01/06/2022  OCT, Retina - OU - Both Eyes       Right Eye Quality was good. Central Foveal Thickness: 258. Progression has no prior data. Findings include normal foveal contour, no IRF, no SRF.   Left Eye Quality was good. Central Foveal Thickness: 336. Progression has no prior data. Findings include no SRF,  abnormal foveal contour, intraretinal fluid (Superior IRF/IRHM).   Notes *Images captured and stored on drive  Diagnosis / Impression:  OD: NFP, no IRF/SRF OS: Superior IRF/IRHM -- superior HRVO  Clinical management:  See below  Abbreviations: NFP - Normal foveal profile. CME - cystoid macular edema. PED - pigment epithelial detachment. IRF - intraretinal fluid. SRF - subretinal fluid. EZ - ellipsoid zone. ERM - epiretinal membrane. ORA - outer retinal atrophy. ORT - outer retinal tubulation. SRHM - subretinal hyper-reflective material. IRHM - intraretinal hyper-reflective material      Intravitreal Injection, Pharmacologic Agent - OS - Left Eye       Time Out 01/06/2022. 11:01 AM. Confirmed correct patient, procedure, site, and patient consented.   Anesthesia Topical anesthesia was used. Anesthetic medications included Lidocaine 2%, Proparacaine 0.5%.   Procedure Preparation included 5% betadine to ocular surface, eyelid speculum. A (32g) needle was used.   Injection: 1.25 mg Bevacizumab 1.13m/0.05ml   Route: Intravitreal, Site: Left Eye   NDC:: 32951-884-16 Lot:: [id number] Expiration date: 02/12/2022   Post-op Post injection exam found visual acuity of at least counting fingers. The patient tolerated the procedure well. There were no complications. The patient received written and verbal post procedure care education.            ASSESSMENT/PLAN:    ICD-10-CM   1. Branch retinal vein occlusion of  left eye with macular edema  H34.8320 OCT, Retina - OU - Both Eyes    Intravitreal Injection, Pharmacologic Agent - OS - Left Eye    Bevacizumab (AVASTIN) SOLN 1.25 mg    2. Essential hypertension  I10     3. Hypertensive retinopathy of both eyes  H35.033      Superior HRVO w/ CME OS - The natural history of retinal vein occlusion and macular edema and treatment options including observation, laser photocoagulation, and intravitreal antiVEGF injection with Avastin and  Lucentis and Eylea and intravitreal injection of steroids with triamcinolone and Ozurdex and the complications of these procedures including loss of vision, infection, cataract, glaucoma, and retinal detachment were discussed with patient. - Discussed studies regarding patient stabilization with anti-VEGF agents and increased potential for visual improvements.  Also discussed need for frequent follow up and potentially multiple injections given the chronic nature of the disease process - BCVA 20/40 - OCT shows superior IRF/IRHM - recommend IVA OD #1 today, 10.09.23 - RBA of procedure discussed, questions answered - informed consent obtained and signed - see procedure note - F/U 4 weeks -- DFE/OCT/possible injection  2,3. Hypertensive retinopathy OU - discussed importance of tight BP control - monitor  4. Pseudophakia OU  - s/p CE/toric IOL OU  - IOLs in good position, doing well  - monitor  Ophthalmic Meds Ordered this visit:  Meds ordered this encounter  Medications   Bevacizumab (AVASTIN) SOLN 1.25 mg     Return in about 4 weeks (around 02/03/2022) for f/u BRVO OS, DFE, OCT.  There are no Patient Instructions on file for this visit.  Explained the diagnoses, plan, and follow up with the patient and they expressed understanding.  Patient expressed understanding of the importance of proper follow up care.   This document serves as a record of services personally performed by Gardiner Sleeper, MD, PhD. It was created on their behalf by Roselee Nova, COMT. The creation of this record is the provider's dictation and/or activities during the visit.  Electronically signed by: Roselee Nova, COMT 01/06/22 12:13 PM  This document serves as a record of services personally performed by Gardiner Sleeper, MD, PhD. It was created on their behalf by San Jetty. Owens Shark, OA an ophthalmic technician. The creation of this record is the provider's dictation and/or activities during the visit.     Electronically signed by: San Jetty. Owens Shark, [place] 10.09.2023 12:13 PM  Gardiner Sleeper, M.D., Ph.D. Diseases & Surgery of the Retina and Vitreous [facility]  I have reviewed the above documentation for accuracy and completeness, and I agree with the above. Gardiner Sleeper, M.D., Ph.D. 01/06/22 12:13 PM  Abbreviations: M myopia (nearsighted); A astigmatism; H hyperopia (farsighted); P presbyopia; Mrx spectacle prescription;  CTL contact lenses; OD right eye; OS left eye; OU both eyes  XT exotropia; ET esotropia; PEK punctate epithelial keratitis; PEE punctate epithelial erosions; DES dry eye syndrome; MGD meibomian gland dysfunction; ATs artificial tears; PFAT's preservative free artificial tears; [place] nuclear sclerotic cataract; PSC posterior subcapsular cataract; ERM epi-retinal membrane; PVD posterior vitreous detachment; RD retinal detachment; DM diabetes mellitus; DR diabetic retinopathy; NPDR non-proliferative diabetic retinopathy; PDR proliferative diabetic retinopathy; CSME clinically significant macular edema; DME diabetic macular edema; dbh dot blot hemorrhages; CWS cotton wool spot; POAG primary open angle glaucoma; C/D cup-to-disc ratio; HVF humphrey visual field; GVF goldmann visual field; OCT optical coherence tomography; IOP intraocular pressure; BRVO Branch retinal vein occlusion; CRVO central retinal vein  occlusion; CRAO central retinal artery occlusion; BRAO branch retinal artery occlusion; RT retinal tear; SB scleral buckle; PPV pars plana vitrectomy; VH Vitreous hemorrhage; PRP panretinal laser photocoagulation; IVK intravitreal kenalog; VMT vitreomacular traction; MH Macular hole;  NVD neovascularization of the disc; NVE neovascularization elsewhere; AREDS age related eye disease study; ARMD age related macular degeneration; POAG primary open angle glaucoma; EBMD epithelial/anterior basement membrane dystrophy; ACIOL anterior chamber intraocular lens; IOL  intraocular lens; PCIOL posterior chamber intraocular lens; Phaco/IOL phacoemulsification with intraocular lens placement; [place] photorefractive keratectomy; LASIK laser assisted in situ keratomileusis; HTN hypertension; DM diabetes mellitus; COPD chronic obstructive pulmonary disease

## 2022-01-06 ENCOUNTER — Encounter (INDEPENDENT_AMBULATORY_CARE_PROVIDER_SITE_OTHER): Payer: Self-pay | Admitting: Ophthalmology

## 2022-01-06 ENCOUNTER — Ambulatory Visit (INDEPENDENT_AMBULATORY_CARE_PROVIDER_SITE_OTHER): Payer: Medicare Other | Admitting: Ophthalmology

## 2022-01-06 DIAGNOSIS — I10 Essential (primary) hypertension: Secondary | ICD-10-CM

## 2022-01-06 DIAGNOSIS — H35033 Hypertensive retinopathy, bilateral: Secondary | ICD-10-CM | POA: Diagnosis not present

## 2022-01-06 DIAGNOSIS — H34832 Tributary (branch) retinal vein occlusion, left eye, with macular edema: Secondary | ICD-10-CM | POA: Diagnosis not present

## 2022-01-06 MED ORDER — BEVACIZUMAB CHEMO INJECTION 1.25MG/0.05ML SYRINGE FOR KALEIDOSCOPE
1.2500 mg | INTRAVITREAL | Status: AC | PRN
  Administered 2022-01-06: 1.25 mg via INTRAVITREAL

## 2022-01-29 NOTE — Progress Notes (Signed)
[facility] Note  02/03/2022    CHIEF COMPLAINT Patient presents for Retina Follow Up  HISTORY OF PRESENT ILLNESS: Glenn Hunt. is a 88 y.o. male who presents to the clinic today for:   HPI     Retina Follow Up   Patient presents with  CRVO/BRVO.  In left eye.  Severity is moderate.  Duration of 4 weeks.  Since onset it is stable.  I, the attending physician,  performed the HPI with the patient and updated documentation appropriately.        Comments   Pt here for 4 wk ret f/u BRVO OS. Pt states VA is the same, doing well. No complaints.       Last edited by Bernarda Caffey, MD on 02/03/2022  4:32 PM.    Pt states he could not tell any difference in vision until he came in this morning and read the eye chart, he states he has a few more floaters, but they are tiny   Referring physician: Ginger Organ., MD [name],   [id number]  HISTORICAL INFORMATION:   Selected notes from the MEDICAL RECORD NUMBER Referred by Dr. Valetta Close for HRVO LEE:  Ocular Hx- PMH-    CURRENT MEDICATIONS: No current outpatient medications on file. (Ophthalmic Drugs)   No current facility-administered medications for this visit. (Ophthalmic Drugs)   Current Outpatient Medications (Other)  Medication Sig   acetaminophen (TYLENOL) 500 MG tablet Take 1,000 mg by mouth every 8 (eight) hours as needed for moderate pain.   allopurinol (ZYLOPRIM) 300 MG tablet Take 300 mg by mouth daily.    ibuprofen (ADVIL) 200 MG tablet Take 600 mg by mouth every 8 (eight) hours as needed for moderate pain.   Multiple Vitamins-Minerals (MULTIVITAMIN ADULT PO) Take 1 tablet by mouth daily.   potassium chloride (KLOR-CON) 10 MEQ tablet Take 10 mEq by mouth 2 (two) times daily.   torsemide (DEMADEX) 10 MG tablet Take 5 mg by mouth daily as needed (fluid).   triamterene-hydrochlorothiazide (MAXZIDE-25) 37.5-25 MG tablet Take 1 tablet by mouth daily.    ondansetron (ZOFRAN ODT) 4 MG disintegrating tablet Take 1 tablet (4 mg total) by mouth every 8 (eight) hours as needed for nausea or vomiting. (Patient not taking: Reported on 01/06/2022)   No current facility-administered medications for this visit. (Other)   REVIEW OF SYSTEMS: ROS   Positive for: Musculoskeletal, Eyes Negative for: Constitutional, Gastrointestinal, Neurological, Skin, Genitourinary, HENT, Endocrine, Cardiovascular, Respiratory, Psychiatric, Allergic/Imm, Heme/Lymph Last edited by Kingsley Spittle, COT on 02/03/2022  9:35 AM.      ALLERGIES Allergies  Allergen Reactions   Famotidine Other (See Comments)   Tetracycline Hcl Other (See Comments)   Tetracyclines & Related Other (See Comments)    Infection of penis   PAST MEDICAL HISTORY Past Medical History:  Diagnosis Date   Anemia    Arthritis    CAD (coronary artery disease)    2007, inferior ischemia on nuclear scan, catheterization showed  60-70% ostial PDA, 90% distal RCA and a very large right coronary artery ( vision stent was placed 3.5 x 23 mm), 80-90% diagonal ( small vessel, wire could not be passed patient followed medically)   CAD (coronary artery disease)    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; 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 but considers himslef stable , he denies chest pain , headache, sob  , reports he does "heavy exercise "  states " i work out with a trainer 2-3 times a week and go walking but i havent done much since the gyms closed "    Cancer of kidney ([place])    Creatinine elevation    Dieulafoy lesion of stomach    incidence of bleeds in 2015 and 2017 , required blood transfusion as bleed lef to a 4.5 hemoglobin   Gout    H/O right nephrectomy    History of blood transfusion    Hypertension    Skin cancer    skin , squamous cell    Tuberculosis 1962   Varicose veins of legs    bialtera; "ive had them about 5 years now and theyve never been a problem"    Past  Surgical History:  Procedure Laterality Date   CATARACT EXTRACTION, BILATERAL  2017   COLONOSCOPY     CORONARY ANGIOPLASTY WITH STENT PLACEMENT  2006   ESOPHAGOGASTRODUODENOSCOPY  04/20/2012   Procedure: ESOPHAGOGASTRODUODENOSCOPY (EGD);  Surgeon: Wonda Horner, MD;  Location: [facility] ENDOSCOPY;  Service: Endoscopy;  Laterality: N/A;   NEPHRECTOMY Right    REPORTS AVG CREATININE 1.4 FOR THE LAST 20 YEARS    REVERSE SHOULDER ARTHROPLASTY Right 02/14/2020   Procedure: REVERSE SHOULDER ARTHROPLASTY;  Surgeon: [name], MD;  Location: [facility] ORS;  Service: Orthopedics;  Laterality: Right;  2.5 hrs   TOTAL KNEE ARTHROPLASTY Right 08/26/2018   Procedure: TOTAL KNEE ARTHROPLASTY;  Surgeon: Paralee Cancel, MD;  Location: [facility] ORS;  Service: Orthopedics;  Laterality: Right;  70 mins   FAMILY HISTORY History reviewed. No pertinent family history.  SOCIAL HISTORY Social History   Tobacco Use   Smoking status: Former    Types: Cigarettes    Quit date: 04/01/2021    Years since quitting: 0.8   Smokeless tobacco: Never   Tobacco comments:    7 cigarettes a day fo the last 10 years   Vaping Use   Vaping Use: Never used  Substance Use Topics   Alcohol use: Yes    Alcohol/week: 7.0 standard drinks of alcohol    Types: 7 Glasses of wine per week    Comment: daily    Drug use: No       OPHTHALMIC EXAM:  Base Eye Exam     Visual Acuity (Snellen - Linear)       Right Left   Dist cc 20/20 -1 20/25    Correction: Glasses         Tonometry (Tonopen, 9:40 AM)       Right Left   Pressure 17 13         Pupils       Dark Light Shape React APD   Right 3 2 Round Brisk None   Left 3 2 Round Brisk None         Visual Fields (Counting fingers)       Left Right    Full Full         Extraocular Movement       Right Left    Full, Ortho Full, Ortho         Neuro/Psych     Oriented x3: Yes   Mood/Affect: Normal         Dilation     Both eyes: 1.0% Mydriacyl,  2.5% Phenylephrine @ 9:41 AM           Slit Lamp and Fundus Exam     Slit Lamp Exam       Right Left  Lids/Lashes Dermatochalasis - upper lid Dermatochalasis - upper lid   Conjunctiva/Sclera Mild conjunctivochalasis Mild conjunctivochalasis   Cornea 1+ Punctate epithelial erosions 1+ Punctate epithelial erosions   Anterior Chamber deep and clear deep and clear   Iris Round and dilated Round and dilated   Lens Toric PC IOL in good position with marks at 0300 and 0900 Toric PC IOL in good position with marks at 0300 and 0900   Anterior Vitreous Vitreous syneresis Vitreous syneresis         Fundus Exam       Right Left   Disc mild Pallor, Sharp rim Pink and Sharp, +hyperemia -- improving, +heme superiorly - improved   C/D Ratio 0.7 0.6   Macula Flat, good foveal reflex, RPE mottling and clumping, No heme or edema Blunted foveal reflex, confluent IRH superior macula, +CWS, +flame hemes -- all improving   Vessels attenuated, Tortuous attenuated, Tortuous, superior HRVO   Periphery Attached Attached, superior DBH, pigmented paving stone degeneration inferiorly           Refraction     Wearing Rx       Sphere Cylinder Axis Add   Right -1.00 +0.75 014 +2.75   Left -1.00 +1.50 148 +2.75           IMAGING AND PROCEDURES  Imaging and Procedures for 02/03/2022  OCT, Retina - OU - Both Eyes       Right Eye Quality was good. Central Foveal Thickness: 265. Progression has been stable. Findings include normal foveal contour, no IRF, no SRF, retinal drusen .   Left Eye Quality was good. Central Foveal Thickness: 284. Progression has improved. Findings include normal foveal contour, no IRF, no SRF, intraretinal hyper-reflective material (Interval improvement in superior IRF/IRHM).   Notes *Images captured and stored on drive  Diagnosis / Impression:  OD: NFP, no IRF/SRF OS: superior HRVO -- interval improvement in superior IRF/IRHM  Clinical management:  See  below  Abbreviations: NFP - Normal foveal profile. CME - cystoid macular edema. PED - pigment epithelial detachment. IRF - intraretinal fluid. SRF - subretinal fluid. EZ - ellipsoid zone. ERM - epiretinal membrane. ORA - outer retinal atrophy. ORT - outer retinal tubulation. SRHM - subretinal hyper-reflective material. IRHM - intraretinal hyper-reflective material      Intravitreal Injection, Pharmacologic Agent - OS - Left Eye       Time Out 02/03/2022. 10:41 AM. Confirmed correct patient, procedure, site, and patient consented.   Anesthesia Topical anesthesia was used. Anesthetic medications included Lidocaine 2%, Proparacaine 0.5%.   Procedure Preparation included 5% betadine to ocular surface, eyelid speculum. A (32g) needle was used.   Injection: 1.25 mg Bevacizumab 1.66m/0.05ml   Route: Intravitreal, Site: Left Eye   NDC:: 78295-621-30 Lot:: [id number] Expiration date: 02/26/2022   Post-op Post injection exam found visual acuity of at least counting fingers. The patient tolerated the procedure well. There were no complications. The patient received written and verbal post procedure care education.            ASSESSMENT/PLAN:    ICD-10-CM   1. Branch retinal vein occlusion of left eye with macular edema  H34.8320 OCT, Retina - OU - Both Eyes    Intravitreal Injection, Pharmacologic Agent - OS - Left Eye    Bevacizumab (AVASTIN) SOLN 1.25 mg    2. Essential hypertension  I10     3. Hypertensive retinopathy of both eyes  H35.033     4. Pseudophakia of both eyes  Z96.1  Superior HRVO w/ CME OS - s/p IVA OS #1 (10.09.23) - BCVA improved to 20/25 from 20/40 - OCT shows interval improvement in superior IRF/IRHM - recommend IVA OS #2 today, 11.06.23 - RBA of procedure discussed, questions answered - informed consent obtained and signed - see procedure note - F/U 4 weeks -- DFE/OCT/possible injection  2,3. Hypertensive retinopathy OU - discussed importance of  tight BP control - monitor  4. Pseudophakia OU  - s/p CE/toric IOL OU  - IOLs in good position, doing well  - monitor  Ophthalmic Meds Ordered this visit:  Meds ordered this encounter  Medications   Bevacizumab (AVASTIN) SOLN 1.25 mg     Return in about 4 weeks (around 03/03/2022) for f/u HRVO OS, DFE, OCT.  There are no Patient Instructions on file for this visit.  Explained the diagnoses, plan, and follow up with the patient and they expressed understanding.  Patient expressed understanding of the importance of proper follow up care.   This document serves as a record of services personally performed by Gardiner Sleeper, MD, PhD. It was created on their behalf by Roselee Nova, COMT. The creation of this record is the provider's dictation and/or activities during the visit.  Electronically signed by: Roselee Nova, COMT 02/03/22 4:32 PM  This document serves as a record of services personally performed by Gardiner Sleeper, MD, PhD. It was created on their behalf by San Jetty. Owens Shark, OA an ophthalmic technician. The creation of this record is the provider's dictation and/or activities during the visit.    Electronically signed by: San Jetty. Owens Shark, [place] 11.06.2023 4:32 PM   Gardiner Sleeper, M.D., Ph.D. Diseases & Surgery of the Retina and Vitreous [facility]  I have reviewed the above documentation for accuracy and completeness, and I agree with the above. Gardiner Sleeper, M.D., Ph.D. 02/03/22 4:33 PM   Abbreviations: M myopia (nearsighted); A astigmatism; H hyperopia (farsighted); P presbyopia; Mrx spectacle prescription;  CTL contact lenses; OD right eye; OS left eye; OU both eyes  XT exotropia; ET esotropia; PEK punctate epithelial keratitis; PEE punctate epithelial erosions; DES dry eye syndrome; MGD meibomian gland dysfunction; ATs artificial tears; PFAT's preservative free artificial tears; [place] nuclear sclerotic cataract; PSC posterior subcapsular cataract;  ERM epi-retinal membrane; PVD posterior vitreous detachment; RD retinal detachment; DM diabetes mellitus; DR diabetic retinopathy; NPDR non-proliferative diabetic retinopathy; PDR proliferative diabetic retinopathy; CSME clinically significant macular edema; DME diabetic macular edema; dbh dot blot hemorrhages; CWS cotton wool spot; POAG primary open angle glaucoma; C/D cup-to-disc ratio; HVF humphrey visual field; GVF goldmann visual field; OCT optical coherence tomography; IOP intraocular pressure; BRVO Branch retinal vein occlusion; CRVO central retinal vein occlusion; CRAO central retinal artery occlusion; BRAO branch retinal artery occlusion; RT retinal tear; SB scleral buckle; PPV pars plana vitrectomy; VH Vitreous hemorrhage; PRP panretinal laser photocoagulation; IVK intravitreal kenalog; VMT vitreomacular traction; MH Macular hole;  NVD neovascularization of the disc; NVE neovascularization elsewhere; AREDS age related eye disease study; ARMD age related macular degeneration; POAG primary open angle glaucoma; EBMD epithelial/anterior basement membrane dystrophy; ACIOL anterior chamber intraocular lens; IOL intraocular lens; PCIOL posterior chamber intraocular lens; Phaco/IOL phacoemulsification with intraocular lens placement; [place] photorefractive keratectomy; LASIK laser assisted in situ keratomileusis; HTN hypertension; DM diabetes mellitus; COPD chronic obstructive pulmonary disease

## 2022-02-03 ENCOUNTER — Encounter (INDEPENDENT_AMBULATORY_CARE_PROVIDER_SITE_OTHER): Payer: Self-pay | Admitting: Ophthalmology

## 2022-02-03 ENCOUNTER — Encounter (INDEPENDENT_AMBULATORY_CARE_PROVIDER_SITE_OTHER): Payer: Medicare Other | Admitting: Ophthalmology

## 2022-02-03 ENCOUNTER — Ambulatory Visit (INDEPENDENT_AMBULATORY_CARE_PROVIDER_SITE_OTHER): Payer: Medicare Other | Admitting: Ophthalmology

## 2022-02-03 DIAGNOSIS — Z961 Presence of intraocular lens: Secondary | ICD-10-CM

## 2022-02-03 DIAGNOSIS — I10 Essential (primary) hypertension: Secondary | ICD-10-CM

## 2022-02-03 DIAGNOSIS — H34832 Tributary (branch) retinal vein occlusion, left eye, with macular edema: Secondary | ICD-10-CM | POA: Diagnosis not present

## 2022-02-03 DIAGNOSIS — H35033 Hypertensive retinopathy, bilateral: Secondary | ICD-10-CM | POA: Diagnosis not present

## 2022-02-03 MED ORDER — BEVACIZUMAB CHEMO INJECTION 1.25MG/0.05ML SYRINGE FOR KALEIDOSCOPE
1.2500 mg | INTRAVITREAL | Status: AC | PRN
  Administered 2022-02-03: 1.25 mg via INTRAVITREAL

## 2022-02-28 NOTE — Progress Notes (Signed)
[facility] Note  03/03/2022    CHIEF COMPLAINT Patient presents for Retina Follow Up  HISTORY OF PRESENT ILLNESS: Glenn Hunt. is a 88 y.o. male who presents to the clinic today for:   HPI     Retina Follow Up   Patient presents with  CRVO/BRVO.  In left eye.  Severity is moderate.  Duration of 4 weeks.  Since onset it is stable.  I, the attending physician,  performed the HPI with the patient and updated documentation appropriately.        Comments   4 week Retina follow up [place] edited by Bernarda Caffey, MD on 03/03/2022 12:29 PM.     Referring physician: Ginger Organ., MD [place],  [place] [id number]  HISTORICAL INFORMATION:   Selected notes from the MEDICAL RECORD NUMBER Referred by Dr. Valetta Close for HRVO LEE:  Ocular Hx- PMH-    CURRENT MEDICATIONS: No current outpatient medications on file. (Ophthalmic Drugs)   No current facility-administered medications for this visit. (Ophthalmic Drugs)   Current Outpatient Medications (Other)  Medication Sig   acetaminophen (TYLENOL) 500 MG tablet Take 1,000 mg by mouth every 8 (eight) hours as needed for moderate pain.   allopurinol (ZYLOPRIM) 300 MG tablet Take 300 mg by mouth daily.    ibuprofen (ADVIL) 200 MG tablet Take 600 mg by mouth every 8 (eight) hours as needed for moderate pain.   Multiple Vitamins-Minerals (MULTIVITAMIN ADULT PO) Take 1 tablet by mouth daily.   ondansetron (ZOFRAN ODT) 4 MG disintegrating tablet Take 1 tablet (4 mg total) by mouth every 8 (eight) hours as needed for nausea or vomiting. (Patient not taking: Reported on 01/06/2022)   potassium chloride (KLOR-CON) 10 MEQ tablet Take 10 mEq by mouth 2 (two) times daily.   torsemide (DEMADEX) 10 MG tablet Take 5 mg by mouth daily as needed (fluid).   triamterene-hydrochlorothiazide (MAXZIDE-25) 37.5-25 MG tablet Take 1 tablet by mouth daily.   No current facility-administered  medications for this visit. (Other)   REVIEW OF SYSTEMS:    ALLERGIES Allergies  Allergen Reactions   Famotidine Other (See Comments)   Tetracycline Hcl Other (See Comments)   Tetracyclines & Related Other (See Comments)    Infection of penis   PAST MEDICAL HISTORY Past Medical History:  Diagnosis Date   Anemia    Arthritis    CAD (coronary artery disease)    2007, inferior ischemia on nuclear scan, catheterization showed  60-70% ostial PDA, 90% distal RCA and a very large right coronary artery ( vision stent was placed 3.5 x 23 mm), 80-90% diagonal ( small vessel, wire could not be passed patient followed medically)   CAD (coronary artery disease)    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; 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 but considers himslef stable , he denies chest pain , headache, sob  , reports he does "heavy exercise " states " i work out with a trainer 2-3 times a week and go walking but i havent done much since the gyms closed "    Cancer of kidney ([place])    Creatinine elevation    Dieulafoy lesion of stomach    incidence of bleeds in 2015 and 2017 , required blood transfusion as bleed lef to a 4.5 hemoglobin   Gout    H/O right nephrectomy    History of blood transfusion  Hypertension    Skin cancer    skin , squamous cell    Tuberculosis 1962   Varicose veins of legs    bialtera; "ive had them about 5 years now and theyve never been a problem"    Past Surgical History:  Procedure Laterality Date   CATARACT EXTRACTION, BILATERAL  2017   COLONOSCOPY     CORONARY ANGIOPLASTY WITH STENT PLACEMENT  2006   ESOPHAGOGASTRODUODENOSCOPY  04/20/2012   Procedure: ESOPHAGOGASTRODUODENOSCOPY (EGD);  Surgeon: Wonda Horner, MD;  Location[facility] ENDOSCOPY;  Service: Endoscopy;  Laterality: N/A;   NEPHRECTOMY Right    REPORTS AVG CREATININE 1.4 FOR THE LAST 20 YEARS    REVERSE SHOULDER ARTHROPLASTY Right 02/14/2020   Procedure: REVERSE SHOULDER ARTHROPLASTY;   Surgeon: [name], MD;  Location: [facility] ORS;  Service: Orthopedics;  Laterality: Right;  2.5 hrs   TOTAL KNEE ARTHROPLASTY Right 08/26/2018   Procedure: TOTAL KNEE ARTHROPLASTY;  Surgeon: Paralee Cancel, MD;  Location: [facility] ORS;  Service: Orthopedics;  Laterality: Right;  70 mins   FAMILY HISTORY History reviewed. No pertinent family history.  SOCIAL HISTORY Social History   Tobacco Use   Smoking status: Former    Types: Cigarettes    Quit date: 04/01/2021    Years since quitting: 0.9   Smokeless tobacco: Never   Tobacco comments:    7 cigarettes a day fo the last 10 years   Vaping Use   Vaping Use: Never used  Substance Use Topics   Alcohol use: Yes    Alcohol/week: 7.0 standard drinks of alcohol    Types: 7 Glasses of wine per week    Comment: daily    Drug use: No       OPHTHALMIC EXAM:  Base Eye Exam     Visual Acuity (Snellen - Linear)       Right Left   Dist cc 20/25 20/40 -1   Dist ph cc NI NI    Correction: Glasses         Tonometry (Tonopen, 9:33 AM)       Right Left   Pressure 16 14         Pupils       Pupils Dark Light Shape React APD   Right PERRL 3 2 Round Brisk None   Left PERRL 3 2 Round Brisk None         Visual Fields       Left Right    Full Full         Extraocular Movement       Right Left    Full, Ortho Full, Ortho         Neuro/Psych     Oriented x3: Yes   Mood/Affect: Normal         Dilation     Both eyes: 2.5% Phenylephrine @ 9:33 AM           Slit Lamp and Fundus Exam     Slit Lamp Exam       Right Left   Lids/Lashes Dermatochalasis - upper lid Dermatochalasis - upper lid   Conjunctiva/Sclera Mild conjunctivochalasis Mild conjunctivochalasis   Cornea 1+ Punctate epithelial erosions 1+ Punctate epithelial erosions   Anterior Chamber deep and clear deep and clear   Iris Round and dilated Round and dilated   Lens Toric PC IOL in good position with marks at 0300 and 0900 Toric PC IOL in  good position with marks at 0300 and 0900   Anterior  Vitreous Vitreous syneresis, PVD, vit condensations Vitreous syneresis, Posterior vitreous detachment         Fundus Exam       Right Left   Disc mild Pallor, Sharp rim Pink and Sharp, +hyperemia -- improving, +heme superiorly - improved   C/D Ratio 0.7 0.6   Macula Flat, good foveal reflex, RPE mottling and clumping, No heme or edema Blunted foveal reflex, confluent IRH superior macula, +CWS, +flame hemes -- all improving   Vessels attenuated, Tortuous attenuated, Tortuous, superior HRVO   Periphery Attached Attached, superior DBH, pigmented paving stone degeneration inferiorly           Refraction     Wearing Rx       Sphere Cylinder Axis Add   Right -1.00 +0.75 014 +2.75   Left -1.00 +1.50 148 +2.75           IMAGING AND PROCEDURES  Imaging and Procedures for 03/03/2022  OCT, Retina - OU - Both Eyes       Right Eye Quality was good. Central Foveal Thickness: 265. Progression has been stable. Findings include normal foveal contour, no IRF, no SRF, myopic contour, retinal drusen .   Left Eye Quality was good. Central Foveal Thickness: 278. Progression has improved. Findings include normal foveal contour, no SRF, myopic contour, intraretinal hyper-reflective material, intraretinal fluid (Mild interval improvement in superior IRF/IRHM).   Notes *Images captured and stored on drive  Diagnosis / Impression:  OD: NFP, no IRF/SRF OS: superior HRVO -- mild interval improvement in superior IRF/IRHM  Clinical management:  See below  Abbreviations: NFP - Normal foveal profile. CME - cystoid macular edema. PED - pigment epithelial detachment. IRF - intraretinal fluid. SRF - subretinal fluid. EZ - ellipsoid zone. ERM - epiretinal membrane. ORA - outer retinal atrophy. ORT - outer retinal tubulation. SRHM - subretinal hyper-reflective material. IRHM - intraretinal hyper-reflective material             ASSESSMENT/PLAN:    ICD-10-CM   1. Branch retinal vein occlusion of left eye with macular edema  H34.8320 OCT, Retina - OU - Both Eyes    2. Essential hypertension  I10     3. Hypertensive retinopathy of both eyes  H35.033     4. Pseudophakia of both eyes  Z96.1      Superior HRVO w/ CME OS - s/p IVA OS #1 (10.09.23), #2 (11.06.23) - BCVA OS 20/40 from 20/25 - OCT shows interval improvement in superior IRF/IRHM - recommend IVA OS #3 today, 12.04.23 - RBA of procedure discussed, questions answered - informed consent obtained and signed - see procedure note - F/U 4 weeks -- DFE/OCT/possible injection  2,3. Hypertensive retinopathy OU - discussed importance of tight BP control - monitor  4. Pseudophakia OU  - s/p CE/toric IOL OU  - IOLs in good position, doing well  - monitor  Ophthalmic Meds Ordered this visit:  No orders of the defined types were placed in this encounter.    Return in about 4 years (around 03/03/2026) for sup HRVO OS, DFE, OCT, likely injection.  There are no Patient Instructions on file for this visit.  Explained the diagnoses, plan, and follow up with the patient and they expressed understanding.  Patient expressed understanding of the importance of proper follow up care.   This document serves as a record of services personally performed by Gardiner Sleeper, MD, PhD. It was created on their behalf by Roselee Nova, COMT. The creation of this record is the  provider's dictation and/or activities during the visit.  Electronically signed by: Roselee Nova, COMT 03/03/22 12:41 PM  Gardiner Sleeper, M.D., Ph.D. Diseases & Surgery of the Retina and Vitreous [facility]  I have reviewed the above documentation for accuracy and completeness, and I agree with the above. Gardiner Sleeper, M.D., Ph.D. 03/03/22 12:43 PM  Abbreviations: M myopia (nearsighted); A astigmatism; H hyperopia (farsighted); P presbyopia; Mrx spectacle prescription;   CTL contact lenses; OD right eye; OS left eye; OU both eyes  XT exotropia; ET esotropia; PEK punctate epithelial keratitis; PEE punctate epithelial erosions; DES dry eye syndrome; MGD meibomian gland dysfunction; ATs artificial tears; PFAT's preservative free artificial tears; [place] nuclear sclerotic cataract; PSC posterior subcapsular cataract; ERM epi-retinal membrane; PVD posterior vitreous detachment; RD retinal detachment; DM diabetes mellitus; DR diabetic retinopathy; NPDR non-proliferative diabetic retinopathy; PDR proliferative diabetic retinopathy; CSME clinically significant macular edema; DME diabetic macular edema; dbh dot blot hemorrhages; CWS cotton wool spot; POAG primary open angle glaucoma; C/D cup-to-disc ratio; HVF humphrey visual field; GVF goldmann visual field; OCT optical coherence tomography; IOP intraocular pressure; BRVO Branch retinal vein occlusion; CRVO central retinal vein occlusion; CRAO central retinal artery occlusion; BRAO branch retinal artery occlusion; RT retinal tear; SB scleral buckle; PPV pars plana vitrectomy; VH Vitreous hemorrhage; PRP panretinal laser photocoagulation; IVK intravitreal kenalog; VMT vitreomacular traction; MH Macular hole;  NVD neovascularization of the disc; NVE neovascularization elsewhere; AREDS age related eye disease study; ARMD age related macular degeneration; POAG primary open angle glaucoma; EBMD epithelial/anterior basement membrane dystrophy; ACIOL anterior chamber intraocular lens; IOL intraocular lens; PCIOL posterior chamber intraocular lens; Phaco/IOL phacoemulsification with intraocular lens placement; [place] photorefractive keratectomy; LASIK laser assisted in situ keratomileusis; HTN hypertension; DM diabetes mellitus; COPD chronic obstructive pulmonary disease

## 2022-03-03 ENCOUNTER — Ambulatory Visit (INDEPENDENT_AMBULATORY_CARE_PROVIDER_SITE_OTHER): Payer: Medicare Other | Admitting: Ophthalmology

## 2022-03-03 ENCOUNTER — Encounter (INDEPENDENT_AMBULATORY_CARE_PROVIDER_SITE_OTHER): Payer: Self-pay | Admitting: Ophthalmology

## 2022-03-03 DIAGNOSIS — I10 Essential (primary) hypertension: Secondary | ICD-10-CM | POA: Diagnosis not present

## 2022-03-03 DIAGNOSIS — H35033 Hypertensive retinopathy, bilateral: Secondary | ICD-10-CM | POA: Diagnosis not present

## 2022-03-03 DIAGNOSIS — H34832 Tributary (branch) retinal vein occlusion, left eye, with macular edema: Secondary | ICD-10-CM | POA: Diagnosis not present

## 2022-03-03 DIAGNOSIS — Z961 Presence of intraocular lens: Secondary | ICD-10-CM

## 2022-03-19 NOTE — Progress Notes (Signed)
[facility] Note  04/01/2022    CHIEF COMPLAINT Patient presents for Retina Follow Up  HISTORY OF PRESENT ILLNESS: Glenn Vento. is a 88 y.o. male who presents to the clinic today for:   HPI     Retina Follow Up   Patient presents with  Other.  In left eye.  This started 4 weeks ago.  I, the attending physician,  performed the HPI with the patient and updated documentation appropriately.        Comments   Patient here for 4 weeks retina follow up for HRVO OS. Patient states vision good. No eye pain. Added a medicine for depression but helps pain in legs. Uses AT's prn, systane prn.      Last edited by Bernarda Caffey, MD on 04/04/2022  9:38 PM.    Pt states he is not having any problems with his vision  Referring physician: Ginger Organ., MD [place],  [place] [address]  HISTORICAL INFORMATION:   Selected notes from the MEDICAL RECORD NUMBER Referred by Dr. Valetta Close for HRVO LEE:  Ocular Hx- PMH-    CURRENT MEDICATIONS: No current outpatient medications on file. (Ophthalmic Drugs)   No current facility-administered medications for this visit. (Ophthalmic Drugs)   Current Outpatient Medications (Other)  Medication Sig   acetaminophen (TYLENOL) 500 MG tablet Take 1,000 mg by mouth every 8 (eight) hours as needed for moderate pain.   allopurinol (ZYLOPRIM) 300 MG tablet Take 300 mg by mouth daily.    ibuprofen (ADVIL) 200 MG tablet Take 600 mg by mouth every 8 (eight) hours as needed for moderate pain.   Multiple Vitamins-Minerals (MULTIVITAMIN ADULT PO) Take 1 tablet by mouth daily.   potassium chloride (KLOR-CON) 10 MEQ tablet Take 10 mEq by mouth 2 (two) times daily.   torsemide (DEMADEX) 10 MG tablet Take 5 mg by mouth daily as needed (fluid).   triamterene-hydrochlorothiazide (MAXZIDE-25) 37.5-25 MG tablet Take 1 tablet by mouth daily.   ondansetron (ZOFRAN ODT) 4 MG disintegrating tablet Take 1 tablet (4 mg total)  by mouth every 8 (eight) hours as needed for nausea or vomiting. (Patient not taking: Reported on 01/06/2022)   No current facility-administered medications for this visit. (Other)   REVIEW OF SYSTEMS: ROS   Positive for: Musculoskeletal, Eyes Negative for: Constitutional, Gastrointestinal, Neurological, Skin, Genitourinary, HENT, Endocrine, Cardiovascular, Respiratory, Psychiatric, Allergic/Imm, Heme/Lymph Last edited by Theodore Demark, COA on 04/01/2022  9:43 AM.     ALLERGIES Allergies  Allergen Reactions   Famotidine Other (See Comments)   Tetracycline Hcl Other (See Comments)   Tetracyclines & Related Other (See Comments)    Infection of penis   PAST MEDICAL HISTORY Past Medical History:  Diagnosis Date   Anemia    Arthritis    CAD (coronary artery disease)    2007, inferior ischemia on nuclear scan, catheterization showed  60-70% ostial PDA, 90% distal RCA and a very large right coronary artery ( vision stent was placed 3.5 x 23 mm), 80-90% diagonal ( small vessel, wire could not be passed patient followed medically)   CAD (coronary artery disease)    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; 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 but considers himslef stable , he denies chest pain , headache, sob  , reports he does "heavy exercise " states " i work out with a trainer 2-3 times a week and go walking but i havent done  much since the gyms closed "    Cancer of kidney ([place])    Creatinine elevation    Dieulafoy lesion of stomach    incidence of bleeds in 2015 and 2017 , required blood transfusion as bleed lef to a 4.5 hemoglobin   Gout    H/O right nephrectomy    History of blood transfusion    Hypertension    Skin cancer    skin , squamous cell    Tuberculosis 1962   Varicose veins of legs    bialtera; "ive had them about 5 years now and theyve never been a problem"    Past Surgical History:  Procedure Laterality Date   CATARACT EXTRACTION, BILATERAL  2017    COLONOSCOPY     CORONARY ANGIOPLASTY WITH STENT PLACEMENT  2006   ESOPHAGOGASTRODUODENOSCOPY  04/20/2012   Procedure: ESOPHAGOGASTRODUODENOSCOPY (EGD);  Surgeon: Wonda Horner, MD;  Location: [facility] ENDOSCOPY;  Service: Endoscopy;  Laterality: N/A;   NEPHRECTOMY Right    REPORTS AVG CREATININE 1.4 FOR THE LAST 20 YEARS    REVERSE SHOULDER ARTHROPLASTY Right 02/14/2020   Procedure: REVERSE SHOULDER ARTHROPLASTY;  Surgeon: [name], MD;  Location: [facility] ORS;  Service: Orthopedics;  Laterality: Right;  2.5 hrs   TOTAL KNEE ARTHROPLASTY Right 08/26/2018   Procedure: TOTAL KNEE ARTHROPLASTY;  Surgeon: Paralee Cancel, MD;  Location: [facility] ORS;  Service: Orthopedics;  Laterality: Right;  70 mins   FAMILY HISTORY History reviewed. No pertinent family history.  SOCIAL HISTORY Social History   Tobacco Use   Smoking status: Former    Types: Cigarettes    Quit date: 04/01/2021    Years since quitting: 1.0   Smokeless tobacco: Never   Tobacco comments:    7 cigarettes a day fo the last 10 years   Vaping Use   Vaping Use: Never used  Substance Use Topics   Alcohol use: Yes    Alcohol/week: 7.0 standard drinks of alcohol    Types: 7 Glasses of wine per week    Comment: daily    Drug use: No       OPHTHALMIC EXAM:  Base Eye Exam     Visual Acuity (Snellen - Linear)       Right Left   Dist cc 20/25 +2 20/30 +2   Dist ph cc NI 20/25    Correction: Glasses         Tonometry (Tonopen, 9:40 AM)       Right Left   Pressure 16 13         Pupils       Dark Light Shape React APD   Right 3 2 Round Brisk None   Left 3 2 Round Brisk None         Visual Fields (Counting fingers)       Left Right    Full Full         Extraocular Movement       Right Left    Full, Ortho Full, Ortho         Neuro/Psych     Oriented x3: Yes   Mood/Affect: Normal         Dilation     Both eyes: 1.0% Mydriacyl, 2.5% Phenylephrine @ 9:40 AM           Slit Lamp and Fundus  Exam     Slit Lamp Exam       Right Left   Lids/Lashes Dermatochalasis - upper lid Dermatochalasis - upper lid  Conjunctiva/Sclera Mild conjunctivochalasis Mild conjunctivochalasis   Cornea 1+ Punctate epithelial erosions 1+ Punctate epithelial erosions   Anterior Chamber deep and clear deep and clear   Iris Round and dilated Round and dilated   Lens Toric PC IOL in good position with marks at 0300 and 0900 Toric PC IOL in good position with marks at 0300 and 0900   Anterior Vitreous Vitreous syneresis, PVD, vit condensations Vitreous syneresis, Posterior vitreous detachment         Fundus Exam       Right Left   Disc mild Pallor, Sharp rim Pink and Sharp, +hyperemia -- improving, +heme superiorly - improved   C/D Ratio 0.7 0.6   Macula Flat, good foveal reflex, RPE mottling and clumping, No heme or edema Blunted foveal reflex, confluent IRH superior macula, +CWS, +flame hemes -- all improving   Vessels mild attenuation, mild tortuosity attenuated, Tortuous, superior HRVO   Periphery Attached Attached, superior DBH, pigmented paving stone degeneration inferiorly           Refraction     Wearing Rx       Sphere Cylinder Axis Add   Right -1.00 +0.75 014 +2.75   Left -1.00 +1.50 148 +2.75           IMAGING AND PROCEDURES  Imaging and Procedures for 04/01/2022  OCT, Retina - OU - Both Eyes       Right Eye Quality was good. Central Foveal Thickness: 265. Progression has been stable. Findings include normal foveal contour, no IRF, no SRF, myopic contour, retinal drusen .   Left Eye Quality was good. Central Foveal Thickness: 279. Progression has improved. Findings include normal foveal contour, no SRF, myopic contour, intraretinal hyper-reflective material, intraretinal fluid (Mild interval improvement in superior IRF/IRHM).   Notes *Images captured and stored on drive  Diagnosis / Impression:  OD: NFP, no IRF/SRF OS: superior HRVO -- mild interval improvement in  superior IRF/IRHM  Clinical management:  See below  Abbreviations: NFP - Normal foveal profile. CME - cystoid macular edema. PED - pigment epithelial detachment. IRF - intraretinal fluid. SRF - subretinal fluid. EZ - ellipsoid zone. ERM - epiretinal membrane. ORA - outer retinal atrophy. ORT - outer retinal tubulation. SRHM - subretinal hyper-reflective material. IRHM - intraretinal hyper-reflective material      Intravitreal Injection, Pharmacologic Agent - OS - Left Eye       Time Out 04/01/2022. 10:33 AM. Confirmed correct patient, procedure, site, and patient consented.   Anesthesia Topical anesthesia was used. Anesthetic medications included Lidocaine 2%, Proparacaine 0.5%.   Procedure Preparation included 5% betadine to ocular surface, eyelid speculum. A supplied (32g) needle was used.   Injection: 1.25 mg Bevacizumab 1.'25mg'$ /0.63m   Route: Intravitreal, Site: Left Eye   NDC:: 17494-496-75 Lot: [id number]'$ , Expiration date: 04/16/2022   Post-op Post injection exam found visual acuity of at least counting fingers. The patient tolerated the procedure well. There were no complications. The patient received written and verbal post procedure care education.            ASSESSMENT/PLAN:    ICD-10-CM   1. Branch retinal vein occlusion of left eye with macular edema  H34.8320 OCT, Retina - OU - Both Eyes    Intravitreal Injection, Pharmacologic Agent - OS - Left Eye    Bevacizumab (AVASTIN) SOLN 1.25 mg    2. Essential hypertension  I10     3. Hypertensive retinopathy of both eyes  H35.033     4. Pseudophakia of both eyes  Z96.1      Superior HRVO w/ CME OS - s/p IVA OS #1 (10.09.23), #2 (11.06.23) - BCVA OS 20/25 from 20/40 - OCT shows interval improvement in superior IRF/IRHM - recommend IVA OS #3 today, 12.04.23 - RBA of procedure discussed, questions answered - informed consent obtained and signed - see procedure note - F/U 4 weeks -- DFE/OCT/possible  injection  2,3. Hypertensive retinopathy OU - discussed importance of tight BP control - monitor  4. Pseudophakia OU  - s/p CE/toric IOL OU  - IOLs in good position, doing well  - monitor  Ophthalmic Meds Ordered this visit:  Meds ordered this encounter  Medications   Bevacizumab (AVASTIN) SOLN 1.25 mg     Return in about 4 weeks (around 04/29/2022) for f/u HRVO OS, DFE, OCT.  There are no Patient Instructions on file for this visit.  Explained the diagnoses, plan, and follow up with the patient and they expressed understanding.  Patient expressed understanding of the importance of proper follow up care.   This document serves as a record of services personally performed by Gardiner Sleeper, MD, PhD. It was created on their behalf by Renaldo Reel, [place] an ophthalmic technician. The creation of this record is the provider's dictation and/or activities during the visit.    Electronically signed by:  Renaldo Reel, COT  12.20.23 9:38 PM  This document serves as a record of services personally performed by Gardiner Sleeper, MD, PhD. It was created on their behalf by San Jetty. Owens Shark, OA an ophthalmic technician. The creation of this record is the provider's dictation and/or activities during the visit.    Electronically signed by: San Jetty. Marguerita Merles 01.02.2023 9:38 PM  Gardiner Sleeper, M.D., Ph.D. Diseases & Surgery of the Retina and Vitreous [facility]  I have reviewed the above documentation for accuracy and completeness, and I agree with the above. Gardiner Sleeper, M.D., Ph.D. 04/04/22 9:39 PM   Abbreviations: M myopia (nearsighted); A astigmatism; H hyperopia (farsighted); P presbyopia; Mrx spectacle prescription;  CTL contact lenses; OD right eye; OS left eye; OU both eyes  XT exotropia; ET esotropia; PEK punctate epithelial keratitis; PEE punctate epithelial erosions; DES dry eye syndrome; MGD meibomian gland dysfunction; ATs artificial tears;  PFAT's preservative free artificial tears; [place] nuclear sclerotic cataract; PSC posterior subcapsular cataract; ERM epi-retinal membrane; PVD posterior vitreous detachment; RD retinal detachment; DM diabetes mellitus; DR diabetic retinopathy; NPDR non-proliferative diabetic retinopathy; PDR proliferative diabetic retinopathy; CSME clinically significant macular edema; DME diabetic macular edema; dbh dot blot hemorrhages; CWS cotton wool spot; POAG primary open angle glaucoma; C/D cup-to-disc ratio; HVF humphrey visual field; GVF goldmann visual field; OCT optical coherence tomography; IOP intraocular pressure; BRVO Branch retinal vein occlusion; CRVO central retinal vein occlusion; CRAO central retinal artery occlusion; BRAO branch retinal artery occlusion; RT retinal tear; SB scleral buckle; PPV pars plana vitrectomy; VH Vitreous hemorrhage; PRP panretinal laser photocoagulation; IVK intravitreal kenalog; VMT vitreomacular traction; MH Macular hole;  NVD neovascularization of the disc; NVE neovascularization elsewhere; AREDS age related eye disease study; ARMD age related macular degeneration; POAG primary open angle glaucoma; EBMD epithelial/anterior basement membrane dystrophy; ACIOL anterior chamber intraocular lens; IOL intraocular lens; PCIOL posterior chamber intraocular lens; Phaco/IOL phacoemulsification with intraocular lens placement; [place] photorefractive keratectomy; LASIK laser assisted in situ keratomileusis; HTN hypertension; DM diabetes mellitus; COPD chronic obstructive pulmonary disease

## 2022-04-01 ENCOUNTER — Ambulatory Visit (INDEPENDENT_AMBULATORY_CARE_PROVIDER_SITE_OTHER): Payer: Medicare Other | Admitting: Ophthalmology

## 2022-04-01 ENCOUNTER — Encounter (INDEPENDENT_AMBULATORY_CARE_PROVIDER_SITE_OTHER): Payer: Self-pay | Admitting: Ophthalmology

## 2022-04-01 DIAGNOSIS — H35033 Hypertensive retinopathy, bilateral: Secondary | ICD-10-CM | POA: Diagnosis not present

## 2022-04-01 DIAGNOSIS — I10 Essential (primary) hypertension: Secondary | ICD-10-CM | POA: Diagnosis not present

## 2022-04-01 DIAGNOSIS — Z961 Presence of intraocular lens: Secondary | ICD-10-CM

## 2022-04-01 DIAGNOSIS — H34832 Tributary (branch) retinal vein occlusion, left eye, with macular edema: Secondary | ICD-10-CM | POA: Diagnosis not present

## 2022-04-01 MED ORDER — BEVACIZUMAB CHEMO INJECTION 1.25MG/0.05ML SYRINGE FOR KALEIDOSCOPE
1.2500 mg | INTRAVITREAL | Status: AC | PRN
  Administered 2022-04-01: 1.25 mg via INTRAVITREAL

## 2022-04-15 NOTE — Progress Notes (Signed)
[facility] Note  04/29/2022    CHIEF COMPLAINT Patient presents for Retina Follow Up  HISTORY OF PRESENT ILLNESS: Glenn Hunt. is a 89 y.o. male who presents to the clinic today for:   HPI     Retina Follow Up   Patient presents with  Other.  In left eye.  This started years ago.  Duration of 4 weeks.  I, the attending physician,  performed the HPI with the patient and updated documentation appropriately.        Comments   Patient states that the vision is the same. He is using Systane OU PRN. Patient had COVID in July and now has memory problems.      Last edited by Bernarda Caffey, MD on 04/29/2022 10:58 PM.     Pt states he is not having any problems with his vision  Referring physician: Ginger Organ., MD [place],  [place] [address]  HISTORICAL INFORMATION:   Selected notes from the MEDICAL RECORD NUMBER Referred by Dr. Valetta Close for HRVO LEE:  Ocular Hx- PMH-    CURRENT MEDICATIONS: No current outpatient medications on file. (Ophthalmic Drugs)   No current facility-administered medications for this visit. (Ophthalmic Drugs)   Current Outpatient Medications (Other)  Medication Sig   acetaminophen (TYLENOL) 500 MG tablet Take 1,000 mg by mouth every 8 (eight) hours as needed for moderate pain.   allopurinol (ZYLOPRIM) 300 MG tablet Take 300 mg by mouth daily.    ibuprofen (ADVIL) 200 MG tablet Take 600 mg by mouth every 8 (eight) hours as needed for moderate pain.   Multiple Vitamins-Minerals (MULTIVITAMIN ADULT PO) Take 1 tablet by mouth daily.   ondansetron (ZOFRAN ODT) 4 MG disintegrating tablet Take 1 tablet (4 mg total) by mouth every 8 (eight) hours as needed for nausea or vomiting. (Patient not taking: Reported on 01/06/2022)   potassium chloride (KLOR-CON) 10 MEQ tablet Take 10 mEq by mouth 2 (two) times daily.   torsemide (DEMADEX) 10 MG tablet Take 5 mg by mouth daily as needed (fluid).    triamterene-hydrochlorothiazide (MAXZIDE-25) 37.5-25 MG tablet Take 1 tablet by mouth daily.   No current facility-administered medications for this visit. (Other)   REVIEW OF SYSTEMS: ROS   Positive for: Musculoskeletal, Eyes Negative for: Constitutional, Gastrointestinal, Neurological, Skin, Genitourinary, HENT, Endocrine, Cardiovascular, Respiratory, Psychiatric, Allergic/Imm, Heme/Lymph Last edited by Annie Paras, COT on 04/29/2022  9:22 AM.     ALLERGIES Allergies  Allergen Reactions   Famotidine Other (See Comments)   Tetracycline Hcl Other (See Comments)   Tetracyclines & Related Other (See Comments)    Infection of penis   PAST MEDICAL HISTORY Past Medical History:  Diagnosis Date   Anemia    Arthritis    CAD (coronary artery disease)    2007, inferior ischemia on nuclear scan, catheterization showed  60-70% ostial PDA, 90% distal RCA and a very large right coronary artery ( vision stent was placed 3.5 x 23 mm), 80-90% diagonal ( small vessel, wire could not be passed patient followed medically)   CAD (coronary artery disease)    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; 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 but considers himslef stable , he denies chest pain , headache, sob  , reports he does "heavy exercise " states " i work out with a trainer 2-3 times a week and go walking but i havent done much since the gyms closed "  Cancer of kidney ([place])    Creatinine elevation    Dieulafoy lesion of stomach    incidence of bleeds in 2015 and 2017 , required blood transfusion as bleed lef to a 4.5 hemoglobin   Gout    H/O right nephrectomy    History of blood transfusion    Hypertension    Skin cancer    skin , squamous cell    Tuberculosis 1962   Varicose veins of legs    bialtera; "ive had them about 5 years now and theyve never been a problem"    Past Surgical History:  Procedure Laterality Date   CATARACT EXTRACTION, BILATERAL  2017    COLONOSCOPY     CORONARY ANGIOPLASTY WITH STENT PLACEMENT  2006   ESOPHAGOGASTRODUODENOSCOPY  04/20/2012   Procedure: ESOPHAGOGASTRODUODENOSCOPY (EGD);  Surgeon: Wonda Horner, MD;  Location[facility];  Service: Endoscopy;  Laterality: N/A;   NEPHRECTOMY Right    REPORTS AVG CREATININE 1.4 FOR THE LAST 20 YEARS    REVERSE SHOULDER ARTHROPLASTY Right 02/14/2020   Procedure: REVERSE SHOULDER ARTHROPLASTY;  Surgeon: [name], MD;  Location: [facility] ORS;  Service: Orthopedics;  Laterality: Right;  2.5 hrs   TOTAL KNEE ARTHROPLASTY Right 08/26/2018   Procedure: TOTAL KNEE ARTHROPLASTY;  Surgeon: Paralee Cancel, MD;  Location: [facility];  Service: Orthopedics;  Laterality: Right;  70 mins   FAMILY HISTORY History reviewed. No pertinent family history.  SOCIAL HISTORY Social History   Tobacco Use   Smoking status: Former    Types: Cigarettes    Quit date: 04/01/2021    Years since quitting: 1.0   Smokeless tobacco: Never   Tobacco comments:    7 cigarettes a day fo the last 10 years   Vaping Use   Vaping Use: Never used  Substance Use Topics   Alcohol use: Yes    Alcohol/week: 7.0 standard drinks of alcohol    Types: 7 Glasses of wine per week    Comment: daily    Drug use: No       OPHTHALMIC EXAM:  Base Eye Exam     Visual Acuity (Snellen - Linear)       Right Left   Dist [place] 20/20 -2 20/25         Tonometry (Tonopen, 9:25 AM)       Right Left   Pressure 16 14         Pupils       Dark Light Shape React APD   Right 3 2 Round Brisk None   Left 3 2 Round Brisk None         Visual Fields       Left Right    Full Full         Extraocular Movement       Right Left    Full, Ortho Full, Ortho         Neuro/Psych     Oriented x3: Yes   Mood/Affect: Normal         Dilation     Both eyes: 1.0% Mydriacyl, 2.5% Phenylephrine @ 9:23 AM           Slit Lamp and Fundus Exam     Slit Lamp Exam       Right Left   Lids/Lashes  Dermatochalasis - upper lid Dermatochalasis - upper lid   Conjunctiva/Sclera Mild conjunctivochalasis Mild conjunctivochalasis   Cornea 1+ Punctate epithelial erosions 1+ Punctate epithelial erosions   Anterior Chamber deep and  clear deep and clear   Iris Round and dilated Round and dilated   Lens Toric PC IOL in good position with marks at 0300 and 0900 Toric PC IOL in good position with marks at 0300 and 0900   Anterior Vitreous Vitreous syneresis, PVD, vit condensations Vitreous syneresis, Posterior vitreous detachment         Fundus Exam       Right Left   Disc mild Pallor, Sharp rim Pink and Sharp, +hyperemia -- improving, +heme superiorly - improved, vascular loops   C/D Ratio 0.7 0.6   Macula Flat, good foveal reflex, RPE mottling and clumping, No heme or edema Blunted foveal reflex, confluent IRH superior macula, +CWS, +flame hemes -- all improving   Vessels mild attenuation, mild tortuosity attenuated, Tortuous, superior HRVO   Periphery Attached Attached, superior DBH, pigmented paving stone degeneration inferiorly           Refraction     Wearing Rx       Sphere Cylinder Axis Add   Right -1.00 +0.75 014 +2.75   Left -1.00 +1.50 148 +2.75           IMAGING AND PROCEDURES  Imaging and Procedures for 04/29/2022  OCT, Retina - OU - Both Eyes       Right Eye Quality was good. Central Foveal Thickness: 263. Progression has been stable. Findings include normal foveal contour, no IRF, no SRF, myopic contour, retinal drusen .   Left Eye Quality was borderline. Central Foveal Thickness: 283. Progression has improved. Findings include normal foveal contour, no SRF, myopic contour, intraretinal hyper-reflective material, intraretinal fluid (Stable improvement in superior IRF/IRHM; persistent cystic changes temporal fovea).   Notes *Images captured and stored on drive  Diagnosis / Impression:  OD: NFP, no IRF/SRF OS: Stable improvement in superior IRF/IRHM;  persistent cystic changes temporal fovea  Clinical management:  See below  Abbreviations: NFP - Normal foveal profile. CME - cystoid macular edema. PED - pigment epithelial detachment. IRF - intraretinal fluid. SRF - subretinal fluid. EZ - ellipsoid zone. ERM - epiretinal membrane. ORA - outer retinal atrophy. ORT - outer retinal tubulation. SRHM - subretinal hyper-reflective material. IRHM - intraretinal hyper-reflective material      Intravitreal Injection, Pharmacologic Agent - OS - Left Eye       Time Out 04/29/2022. 10:03 AM. Confirmed correct patient, procedure, site, and patient consented.   Anesthesia Topical anesthesia was used. Anesthetic medications included Lidocaine 2%, Proparacaine 0.5%.   Procedure Preparation included 5% betadine to ocular surface, eyelid speculum. A (32g) needle was used.   Injection: 1.25 mg Bevacizumab 1.'25mg'$ /0.1m   Route: Intravitreal, Site: Left Eye   NDC: [id number] Lot:: [id number] Expiration date: 05/30/2022   Post-op Post injection exam found visual acuity of at least counting fingers. The patient tolerated the procedure well. There were no complications. The patient received written and verbal post procedure care education.            ASSESSMENT/PLAN:    ICD-10-CM   1. Branch retinal vein occlusion of left eye with macular edema  H34.8320 OCT, Retina - OU - Both Eyes    Intravitreal Injection, Pharmacologic Agent - OS - Left Eye    Bevacizumab (AVASTIN) SOLN 1.25 mg    2. Essential hypertension  I10     3. Hypertensive retinopathy of both eyes  H35.033     4. Pseudophakia of both eyes  Z96.1      Superior HRVO w/ CME OS - s/p IVA OS #  1 (10.09.23), #2 (11.06.23), #3 (12.04.23), #4 (01.02.24) - BCVA OS 20/25 stable - OCT shows Stable improvement in superior IRF/IRHM; persistent cystic changes temporal fovea at 4 wks - recommend IVA OS #5 today, 1.30.24 w/ f/u in 4 wks - RBA of procedure discussed, questions answered -  informed consent obtained and signed - see procedure note - will check Eylea authorization for next visit - F/U 4 weeks -- DFE/OCT/possible injection  2,3. Hypertensive retinopathy OU - discussed importance of tight BP control - continue to monitor  4. Pseudophakia OU  - s/p CE/toric IOL OU  - IOLs in good position, doing well  - continue to monitor  Ophthalmic Meds Ordered this visit:  Meds ordered this encounter  Medications   Bevacizumab (AVASTIN) SOLN 1.25 mg     Return in 4 weeks (on 05/27/2022) for f/u HRVO w/ CME OS , DFE, OCT, Possible Injxn.  There are no Patient Instructions on file for this visit.  Explained the diagnoses, plan, and follow up with the patient and they expressed understanding.  Patient expressed understanding of the importance of proper follow up care.   This document serves as a record of services personally performed by Gardiner Sleeper, MD, PhD. It was created on their behalf by Renaldo Reel, [place] an ophthalmic technician. The creation of this record is the provider's dictation and/or activities during the visit.    Electronically signed by:  Renaldo Reel, COT  1.16.24 11:03 PM  Gardiner Sleeper, M.D., Ph.D. Diseases & Surgery of the Retina and [place]  I have reviewed the above documentation for accuracy and completeness, and I agree with the above. Gardiner Sleeper, M.D., Ph.D. 04/29/22 11:03 PM    Abbreviations: M myopia (nearsighted); A astigmatism; H hyperopia (farsighted); P presbyopia; Mrx spectacle prescription;  CTL contact lenses; OD right eye; OS left eye; OU both eyes  XT exotropia; ET esotropia; PEK punctate epithelial keratitis; PEE punctate epithelial erosions; DES dry eye syndrome; MGD meibomian gland dysfunction; ATs artificial tears; PFAT's preservative free artificial tears; [place] nuclear sclerotic cataract; PSC posterior subcapsular cataract; ERM epi-retinal membrane; PVD posterior  vitreous detachment; RD retinal detachment; DM diabetes mellitus; DR diabetic retinopathy; NPDR non-proliferative diabetic retinopathy; PDR proliferative diabetic retinopathy; CSME clinically significant macular edema; DME diabetic macular edema; dbh dot blot hemorrhages; CWS cotton wool spot; POAG primary open angle glaucoma; C/D cup-to-disc ratio; HVF humphrey visual field; GVF goldmann visual field; OCT optical coherence tomography; IOP intraocular pressure; BRVO Branch retinal vein occlusion; CRVO central retinal vein occlusion; CRAO central retinal artery occlusion; BRAO branch retinal artery occlusion; RT retinal tear; SB scleral buckle; PPV pars plana vitrectomy; VH Vitreous hemorrhage; PRP panretinal laser photocoagulation; IVK intravitreal kenalog; VMT vitreomacular traction; MH Macular hole;  NVD neovascularization of the disc; NVE neovascularization elsewhere; AREDS age related eye disease study; ARMD age related macular degeneration; POAG primary open angle glaucoma; EBMD epithelial/anterior basement membrane dystrophy; ACIOL anterior chamber intraocular lens; IOL intraocular lens; PCIOL posterior chamber intraocular lens; Phaco/IOL phacoemulsification with intraocular lens placement; [place] photorefractive keratectomy; LASIK laser assisted in situ keratomileusis; HTN hypertension; DM diabetes mellitus; COPD chronic obstructive pulmonary disease

## 2022-04-29 ENCOUNTER — Encounter (INDEPENDENT_AMBULATORY_CARE_PROVIDER_SITE_OTHER): Payer: Self-pay | Admitting: Ophthalmology

## 2022-04-29 ENCOUNTER — Ambulatory Visit (INDEPENDENT_AMBULATORY_CARE_PROVIDER_SITE_OTHER): Payer: Medicare Other | Admitting: Ophthalmology

## 2022-04-29 DIAGNOSIS — Z961 Presence of intraocular lens: Secondary | ICD-10-CM | POA: Diagnosis not present

## 2022-04-29 DIAGNOSIS — H34832 Tributary (branch) retinal vein occlusion, left eye, with macular edema: Secondary | ICD-10-CM

## 2022-04-29 DIAGNOSIS — H35033 Hypertensive retinopathy, bilateral: Secondary | ICD-10-CM | POA: Diagnosis not present

## 2022-04-29 DIAGNOSIS — I10 Essential (primary) hypertension: Secondary | ICD-10-CM | POA: Diagnosis not present

## 2022-04-29 MED ORDER — BEVACIZUMAB CHEMO INJECTION 1.25MG/0.05ML SYRINGE FOR KALEIDOSCOPE
1.2500 mg | INTRAVITREAL | Status: AC | PRN
  Administered 2022-04-29: 1.25 mg via INTRAVITREAL

## 2022-05-26 NOTE — Progress Notes (Signed)
[facility] Note  05/28/2022    CHIEF COMPLAINT Patient presents for Retina Follow Up  HISTORY OF PRESENT ILLNESS: Glenn Hunt. is a 89 y.o. male who presents to the clinic today for:   HPI     Retina Follow Up   Patient presents with  CRVO/BRVO.  In left eye.  Severity is mild.  Duration of 4 weeks.  Since onset it is gradually improving.  I, the attending physician,  performed the HPI with the patient and updated documentation appropriately.        Comments   4 week Retina eval for HRVO os. Patient states vision is doing very good      Last edited by Bernarda Caffey, MD on 05/28/2022 12:29 PM.    Pt states if he has a heart attack or goes into cardiac arrest, he does not want to be resuscitated, he states ever since he had covid in July 2023, he has a hard time remembering things, he feels like his vision is stable   Referring physician: Jola Schmidt, MD [place],  [place] [address]  HISTORICAL INFORMATION:   Selected notes from the MEDICAL RECORD NUMBER Referred by Dr. Valetta Close for HRVO LEE:  Ocular Hx- PMH-    CURRENT MEDICATIONS: No current outpatient medications on file. (Ophthalmic Drugs)   No current facility-administered medications for this visit. (Ophthalmic Drugs)   Current Outpatient Medications (Other)  Medication Sig   acetaminophen (TYLENOL) 500 MG tablet Take 1,000 mg by mouth every 8 (eight) hours as needed for moderate pain.   allopurinol (ZYLOPRIM) 300 MG tablet Take 300 mg by mouth daily.    ibuprofen (ADVIL) 200 MG tablet Take 600 mg by mouth every 8 (eight) hours as needed for moderate pain.   Multiple Vitamins-Minerals (MULTIVITAMIN ADULT PO) Take 1 tablet by mouth daily.   potassium chloride (KLOR-CON) 10 MEQ tablet Take 10 mEq by mouth 2 (two) times daily.   torsemide (DEMADEX) 10 MG tablet Take 5 mg by mouth daily as needed (fluid).   triamterene-hydrochlorothiazide (MAXZIDE-25) 37.5-25 MG  tablet Take 1 tablet by mouth daily.   ondansetron (ZOFRAN ODT) 4 MG disintegrating tablet Take 1 tablet (4 mg total) by mouth every 8 (eight) hours as needed for nausea or vomiting. (Patient not taking: Reported on 01/06/2022)   No current facility-administered medications for this visit. (Other)   REVIEW OF SYSTEMS: ROS   Positive for: Musculoskeletal, Eyes Negative for: Constitutional, Gastrointestinal, Neurological, Skin, Genitourinary, HENT, Endocrine, Cardiovascular, Respiratory, Psychiatric, Allergic/Imm, Heme/Lymph Last edited by Elmore Guise, COT on 05/28/2022  9:13 AM.      ALLERGIES Allergies  Allergen Reactions   Famotidine Other (See Comments)   Tetracycline Hcl Other (See Comments)   Tetracyclines & Related Other (See Comments)    Infection of penis   PAST MEDICAL HISTORY Past Medical History:  Diagnosis Date   Anemia    Arthritis    CAD (coronary artery disease)    2007, inferior ischemia on nuclear scan, catheterization showed  60-70% ostial PDA, 90% distal RCA and a very large right coronary artery ( vision stent was placed 3.5 x 23 mm), 80-90% diagonal ( small vessel, wire could not be passed patient followed medically)   CAD (coronary artery disease)    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; 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 but considers himslef stable , he denies chest pain , headache, sob  , reports  he does "heavy exercise " states " i work out with a trainer 2-3 times a week and go walking but i havent done much since the gyms closed "    Cancer of kidney ([place])    Creatinine elevation    Dieulafoy lesion of stomach    incidence of bleeds in 2015 and 2017 , required blood transfusion as bleed lef to a 4.5 hemoglobin   Gout    H/O right nephrectomy    History of blood transfusion    Hypertension    Skin cancer    skin , squamous cell    Tuberculosis 1962   Varicose veins of legs    bialtera; "ive had them about 5 years now and theyve  never been a problem"    Past Surgical History:  Procedure Laterality Date   CATARACT EXTRACTION, BILATERAL  2017   COLONOSCOPY     CORONARY ANGIOPLASTY WITH STENT PLACEMENT  2006   ESOPHAGOGASTRODUODENOSCOPY  04/20/2012   Procedure: ESOPHAGOGASTRODUODENOSCOPY (EGD);  Surgeon: Wonda Horner, MD;  Location: [facility] END[facility]COPY;  Service: Endoscopy;  Laterality: N/A;   NEPHRECTOMY Right    REPORTS AVG CREATININE 1.4 FOR THE LAST 20 YEARS    REVERSE SHOULDER ARTHROPLASTY Right 02/14/2020   Procedure: REVERSE SHOULDER ARTHROPLASTY;  Surgeon: [name], MD;  Location: [facility] ORS;  Service: Orthopedics;  Laterality: Right;  2.5 hrs   TOTAL KNEE ARTHROPLASTY Right 08/26/2018   Procedure: TOTAL KNEE ARTHROPLASTY;  Surgeon: Paralee Cancel, MD;  Location: [facility] ORS;  Service: Orthopedics;  Laterality: Right;  70 mins   FAMILY HISTORY History reviewed. No pertinent family history.  SOCIAL HISTORY Social History   Tobacco Use   Smoking status: Former    Types: Cigarettes    Quit date: 04/01/2021    Years since quitting: 1.1   Smokeless tobacco: Never   Tobacco comments:    7 cigarettes a day fo the last 10 years   Vaping Use   Vaping Use: Never used  Substance Use Topics   Alcohol use: Yes    Alcohol/week: 7.0 standard drinks of alcohol    Types: 7 Glasses of wine per week    Comment: daily    Drug use: No       OPHTHALMIC EXAM:  Base Eye Exam     Visual Acuity (Snellen - Linear)       Right Left   Dist cc 20/25 20/20-1   Dist ph cc 20/20-1     Correction: Glasses         Tonometry (Tonopen, 9:15 AM)       Right Left   Pressure 17 16         Pupils       Dark Light Shape React APD   Right 3 2 Round Brisk None   Left 3 2 Round Brisk None         Visual Fields (Counting fingers)       Left Right    Full Full         Extraocular Movement       Right Left    Full, Ortho Full, Ortho         Neuro/Psych     Oriented x3: Yes   Mood/Affect: Normal          Dilation     Both eyes: 1.0% Mydriacyl, 2.5% Phenylephrine @ 9:15 AM           Slit Lamp and Fundus Exam  Slit Lamp Exam       Right Left   Lids/Lashes Dermatochalasis - upper lid Dermatochalasis - upper lid   Conjunctiva/Sclera Mild conjunctivochalasis Mild conjunctivochalasis   Cornea 1+ Punctate epithelial erosions 1+ Punctate epithelial erosions   Anterior Chamber deep and clear deep and clear   Iris Round and dilated Round and dilated   Lens Toric PC IOL in good position with marks at 0300 and 0900 Toric PC IOL in good position with marks at 0300 and 0900   Anterior Vitreous Vitreous syneresis, PVD, vit condensations Vitreous syneresis, Posterior vitreous detachment         Fundus Exam       Right Left   Disc mild Pallor, Sharp rim Pink and Sharp, +hyperemia -- improving, +heme superiorly - improved, vascular loops   C/D Ratio 0.7 0.6   Macula Flat, good foveal reflex, RPE mottling and clumping, No heme or edema Blunted foveal reflex, confluent IRH superior macula, +CWS, +flame hemes -- all improving, central cystic changes slightly increased   Vessels attenuated, Tortuous attenuated, Tortuous, superior HRVO   Periphery Attached Attached, superior DBH, pigmented paving stone degeneration inferiorly           Refraction     Wearing Rx       Sphere Cylinder Axis Add   Right -1.00 +0.75 014 +2.75   Left -1.00 +1.50 148 +2.75           IMAGING AND PROCEDURES  Imaging and Procedures for 05/28/2022  OCT, Retina - OU - Both Eyes       Right Eye Quality was good. Central Foveal Thickness: 264. Progression has been stable. Findings include normal foveal contour, no IRF, no SRF, myopic contour, retinal drusen .   Left Eye Quality was borderline. Central Foveal Thickness: 283. Progression has worsened. Findings include no SRF, abnormal foveal contour, myopic contour, intraretinal hyper-reflective material, intraretinal fluid (mild interval increase in  cystic changes temporal fovea, stable improvement in superior IRF/IRHM).   Notes *Images captured and stored on drive  Diagnosis / Impression:  OD: NFP, no IRF/SRF OS: mild interval increase in cystic changes temporal fovea, stable improvement in superior IRF/IRHM  Clinical management:  See below  Abbreviations: NFP - Normal foveal profile. CME - cystoid macular edema. PED - pigment epithelial detachment. IRF - intraretinal fluid. SRF - subretinal fluid. EZ - ellipsoid zone. ERM - epiretinal membrane. ORA - outer retinal atrophy. ORT - outer retinal tubulation. SRHM - subretinal hyper-reflective material. IRHM - intraretinal hyper-reflective material      Intravitreal Injection, Pharmacologic Agent - OS - Left Eye       Time Out 05/28/2022. 9:50 AM. Confirmed correct patient, procedure, site, and patient consented.   Anesthesia Topical anesthesia was used. Anesthetic medications included Lidocaine 2%, Proparacaine 0.5%.   Procedure Preparation included 5% betadine to ocular surface, eyelid speculum. A (32g) needle was used.   Injection: 2 mg aflibercept 2 MG/0.05ML   Route: Intravitreal, Site: Left Eye   NDC: [id number], Lot: [id number], Expiration date: 05/29/2023, Waste: 0 mL   Post-op Post injection exam found visual acuity of at least counting fingers. The patient tolerated the procedure well. There were no complications. The patient received written and verbal post procedure care education.            ASSESSMENT/PLAN:    ICD-10-CM   1. Branch retinal vein occlusion of left eye with macular edema  H34.8320 OCT, Retina - OU - Both Eyes    Intravitreal Injection, Pharmacologic  Agent - OS - Left Eye    aflibercept (EYLEA) SOLN 2 mg    2. Essential hypertension  I10     3. Hypertensive retinopathy of both eyes  H35.033     4. Pseudophakia of both eyes  Z96.1      Superior HRVO w/ CME OS - s/p IVA OS #1 (10.09.23), #2 (11.06.23), #3 (12.04.23), #4 (01.02.24),  #5 (01.30.24) - BCVA OS 20/20 from 20/25 - OCT shows mild interval increase in cystic changes temporal fovea, stable improvement in superior IRF/IRHM at 4 wks - discussed IVA resistance and potential benefit of switching medications - recommend switching to IVE OS #1 today, 02.28.24 w/ f/u in 4 wks - RBA of procedure discussed, questions answered - Eylea informed consent obtained and signed OS 02.28.24 - Avastin informed consent obtained and signed OS 10.09.23 - see procedure note - Eylea prior auth obtained - F/U 4 weeks -- DFE/OCT/possible injection  2,3. Hypertensive retinopathy OU - discussed importance of tight BP control - continue to monitor  4. Pseudophakia OU  - s/p CE/toric IOL OU  - IOLs in good position, doing well  - continue to monitor  Ophthalmic Meds Ordered this visit:  Meds ordered this encounter  Medications   aflibercept (EYLEA) SOLN 2 mg     Return in about 4 weeks (around 06/25/2022) for f/u HRVO OS, DFE, OCT, possible injxn.  There are no Patient Instructions on file for this visit.  Explained the diagnoses, plan, and follow up with the patient and they expressed understanding.  Patient expressed understanding of the importance of proper follow up care.   This document serves as a record of services personally performed by Gardiner Sleeper, MD, PhD. It was created on their behalf by Orvan Falconer, an ophthalmic technician. The creation of this record is the provider's dictation and/or activities during the visit.    Electronically signed by: Orvan Falconer, OA, 05/28/22  12:33 PM  This document serves as a record of services personally performed by Gardiner Sleeper, MD, PhD. It was created on their behalf by San Jetty. Owens Shark, OA an ophthalmic technician. The creation of this record is the provider's dictation and/or activities during the visit.    Electronically signed by: San Jetty. Marguerita Merles 02.28.2024 12:33 PM  Gardiner Sleeper, M.D., Ph.D. Diseases  & Surgery of the Retina and Vitreous [facility]  I have reviewed the above documentation for accuracy and completeness, and I agree with the above. Gardiner Sleeper, M.D., Ph.D. 05/28/22 12:33 PM  Abbreviations: M myopia (nearsighted); A astigmatism; H hyperopia (farsighted); P presbyopia; Mrx spectacle prescription;  CTL contact lenses; OD right eye; OS left eye; OU both eyes  XT exotropia; ET esotropia; PEK punctate epithelial keratitis; PEE punctate epithelial erosions; DES dry eye syndrome; MGD meibomian gland dysfunction; ATs artificial tears; PFAT's preservative free artificial tears; [place] nuclear sclerotic cataract; PSC posterior subcapsular cataract; ERM epi-retinal membrane; PVD posterior vitreous detachment; RD retinal detachment; DM diabetes mellitus; DR diabetic retinopathy; NPDR non-proliferative diabetic retinopathy; PDR proliferative diabetic retinopathy; CSME clinically significant macular edema; DME diabetic macular edema; dbh dot blot hemorrhages; CWS cotton wool spot; POAG primary open angle glaucoma; C/D cup-to-disc ratio; HVF humphrey visual field; GVF goldmann visual field; OCT optical coherence tomography; IOP intraocular pressure; BRVO Branch retinal vein occlusion; CRVO central retinal vein occlusion; CRAO central retinal artery occlusion; BRAO branch retinal artery occlusion; RT retinal tear; SB scleral buckle; PPV pars plana vitrectomy; VH Vitreous hemorrhage; PRP  panretinal laser photocoagulation; IVK intravitreal kenalog; VMT vitreomacular traction; MH Macular hole;  NVD neovascularization of the disc; NVE neovascularization elsewhere; AREDS age related eye disease study; ARMD age related macular degeneration; POAG primary open angle glaucoma; EBMD epithelial/anterior basement membrane dystrophy; ACIOL anterior chamber intraocular lens; IOL intraocular lens; PCIOL posterior chamber intraocular lens; Phaco/IOL phacoemulsification with intraocular lens  placement; [place] photorefractive keratectomy; LASIK laser assisted in situ keratomileusis; HTN hypertension; DM diabetes mellitus; COPD chronic obstructive pulmonary disease

## 2022-05-27 ENCOUNTER — Encounter (INDEPENDENT_AMBULATORY_CARE_PROVIDER_SITE_OTHER): Payer: Medicare Other | Admitting: Ophthalmology

## 2022-05-28 ENCOUNTER — Encounter (INDEPENDENT_AMBULATORY_CARE_PROVIDER_SITE_OTHER): Payer: Self-pay | Admitting: Ophthalmology

## 2022-05-28 ENCOUNTER — Ambulatory Visit (INDEPENDENT_AMBULATORY_CARE_PROVIDER_SITE_OTHER): Payer: Medicare Other | Admitting: Ophthalmology

## 2022-05-28 DIAGNOSIS — I10 Essential (primary) hypertension: Secondary | ICD-10-CM

## 2022-05-28 DIAGNOSIS — Z961 Presence of intraocular lens: Secondary | ICD-10-CM | POA: Diagnosis not present

## 2022-05-28 DIAGNOSIS — H35033 Hypertensive retinopathy, bilateral: Secondary | ICD-10-CM | POA: Diagnosis not present

## 2022-05-28 DIAGNOSIS — H34832 Tributary (branch) retinal vein occlusion, left eye, with macular edema: Secondary | ICD-10-CM

## 2022-05-28 MED ORDER — AFLIBERCEPT 2MG/0.05ML IZ SOLN FOR KALEIDOSCOPE
2.0000 mg | INTRAVITREAL | Status: AC | PRN
  Administered 2022-05-28: 2 mg via INTRAVITREAL

## 2022-06-19 NOTE — Progress Notes (Signed)
[facility] Note  06/25/2022    CHIEF COMPLAINT Patient presents for Retina Follow Up  HISTORY OF PRESENT ILLNESS: Glenn Cioffi. is a 89 y.o. male who presents to the clinic today for:   HPI     Retina Follow Up   Patient presents with  CRVO/BRVO.  In left eye.  This started weeks ago.  Severity is mild.  Duration of 4 weeks.  Since onset it is gradually improving.  I, the attending physician,  performed the HPI with the patient and updated documentation appropriately.        Comments   Patient feels that the vision is the same. He feels that there is something in his left eye. He is using AT's OU PRN.       Last edited by Bernarda Caffey, MD on 06/25/2022  1:04 PM.    Patient has no complaints with switching to [facility].   Referring physician: Jola Schmidt, MD [place],  [place] [id number]  HISTORICAL INFORMATION:   Selected notes from the MEDICAL RECORD NUMBER Referred by Dr. Valetta Close for HRVO LEE:  Ocular Hx- PMH-    CURRENT MEDICATIONS: No current outpatient medications on file. (Ophthalmic Drugs)   No current facility-administered medications for this visit. (Ophthalmic Drugs)   Current Outpatient Medications (Other)  Medication Sig   acetaminophen (TYLENOL) 500 MG tablet Take 1,000 mg by mouth every 8 (eight) hours as needed for moderate pain.   allopurinol (ZYLOPRIM) 300 MG tablet Take 300 mg by mouth daily.    ibuprofen (ADVIL) 200 MG tablet Take 600 mg by mouth every 8 (eight) hours as needed for moderate pain.   Multiple Vitamins-Minerals (MULTIVITAMIN ADULT PO) Take 1 tablet by mouth daily.   ondansetron (ZOFRAN ODT) 4 MG disintegrating tablet Take 1 tablet (4 mg total) by mouth every 8 (eight) hours as needed for nausea or vomiting. (Patient not taking: Reported on 01/06/2022)   potassium chloride (KLOR-CON) 10 MEQ tablet Take 10 mEq by mouth 2 (two) times daily.   torsemide (DEMADEX) 10 MG tablet Take 5 mg by mouth  daily as needed (fluid).   triamterene-hydrochlorothiazide (MAXZIDE-25) 37.5-25 MG tablet Take 1 tablet by mouth daily.   No current facility-administered medications for this visit. (Other)   REVIEW OF SYSTEMS: ROS   Positive for: Musculoskeletal, Eyes Negative for: Constitutional, Gastrointestinal, Neurological, Skin, Genitourinary, HENT, Endocrine, Cardiovascular, Respiratory, Psychiatric, Allergic/Imm, Heme/Lymph Last edited by Annie Paras, COT on 06/25/2022  9:24 AM.       ALLERGIES Allergies  Allergen Reactions   Famotidine Other (See Comments)   Tetracycline Hcl Other (See Comments)   Tetracyclines & Related Other (See Comments)    Infection of penis   PAST MEDICAL HISTORY Past Medical History:  Diagnosis Date   Anemia    Arthritis    CAD (coronary artery disease)    2007, inferior ischemia on nuclear scan, catheterization showed  60-70% ostial PDA, 90% distal RCA and a very large right coronary artery ( vision stent was placed 3.5 x 23 mm), 80-90% diagonal ( small vessel, wire could not be passed patient followed medically)   CAD (coronary artery disease)    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; 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 but considers himslef stable , he denies chest pain , headache, sob  , reports he does "heavy exercise " states " i work out with a trainer 2-3 times a week and  go walking but i havent done much since the gyms closed "    Cancer of kidney ([place])    Creatinine elevation    Dieulafoy lesion of stomach    incidence of bleeds in 2015 and 2017 , required blood transfusion as bleed lef to a 4.5 hemoglobin   Gout    H/O right nephrectomy    History of blood transfusion    Hypertension    Skin cancer    skin , squamous cell    Tuberculosis 1962   Varicose veins of legs    bialtera; "ive had them about 5 years now and theyve never been a problem"    Past Surgical History:  Procedure Laterality Date   CATARACT  EXTRACTION, BILATERAL  2017   COLONOSCOPY     CORONARY ANGIOPLASTY WITH STENT PLACEMENT  2006   ESOPHAGOGASTRODUODENOSCOPY  04/20/2012   Procedure: ESOPHAGOGASTRODUODENOSCOPY (EGD);  Surgeon: Wonda Horner, MD;  Location[facility] ENDOSCOPY;  Service: Endoscopy;  Laterality: N/A;   NEPHRECTOMY Right    REPORTS AVG CREATININE 1.4 FOR THE LAST 20 YEARS    REVERSE SHOULDER ARTHROPLASTY Right 02/14/2020   Procedure: REVERSE SHOULDER ARTHROPLASTY;  Surgeon: [name], MD;  Location: [facility] ORS;  Service: Orthopedics;  Laterality: Right;  2.5 hrs   TOTAL KNEE ARTHROPLASTY Right 08/26/2018   Procedure: TOTAL KNEE ARTHROPLASTY;  Surgeon: Paralee Cancel, MD;  Location: [facility] ORS;  Service: Orthopedics;  Laterality: Right;  70 mins   FAMILY HISTORY History reviewed. No pertinent family history.  SOCIAL HISTORY Social History   Tobacco Use   Smoking status: Former    Types: Cigarettes    Quit date: 04/01/2021    Years since quitting: 1.2   Smokeless tobacco: Never   Tobacco comments:    7 cigarettes a day fo the last 10 years   Vaping Use   Vaping Use: Never used  Substance Use Topics   Alcohol use: Yes    Alcohol/week: 7.0 standard drinks of alcohol    Types: 7 Glasses of wine per week    Comment: daily    Drug use: No       OPHTHALMIC EXAM:  Base Eye Exam     Visual Acuity (Snellen - Linear)       Right Left   Dist cc 20/20 20/25    Correction: Glasses         Tonometry (Tonopen, 9:26 AM)       Right Left   Pressure 21 19         Pupils       Dark Light Shape React APD   Right 3 2 Round Brisk None   Left 3 2 Round Brisk None         Visual Fields       Left Right    Full Full         Extraocular Movement       Right Left    Full, Ortho Full, Ortho         Neuro/Psych     Oriented x3: Yes   Mood/Affect: Normal         Dilation     Both eyes: 1.0% Mydriacyl, 2.5% Phenylephrine @ 9:24 AM           Slit Lamp and Fundus Exam     Slit  Lamp Exam       Right Left   Lids/Lashes Dermatochalasis - upper lid Dermatochalasis - upper lid   Conjunctiva/Sclera Mild conjunctivochalasis  Mild conjunctivochalasis   Cornea 1+ Punctate epithelial erosions 1+ Punctate epithelial erosions   Anterior Chamber deep and clear deep and clear   Iris Round and dilated Round and dilated   Lens Toric PC IOL in good position with marks at 0300 and 0900 Toric PC IOL in good position with marks at 0300 and 0900   Anterior Vitreous Vitreous syneresis, PVD, vit condensations Vitreous syneresis, Posterior vitreous detachment         Fundus Exam       Right Left   Disc mild Pallor, Sharp rim Pink and Sharp, +hyperemia -- improving, +heme superiorly - improved, vascular loops   C/D Ratio 0.7 0.6   Macula Flat, good foveal reflex, RPE mottling and clumping, No heme or edema Blunted foveal reflex, confluent IRH superior macula, +CWS, +flame hemes -- all improving, central cystic changes sligslightly improvedhtly increased   Vessels attenuated, Tortuous attenuated, Tortuous, superior HRVO   Periphery Attached Attached, superior DBH, pigmented paving stone degeneration inferiorly           Refraction     Wearing Rx       Sphere Cylinder Axis Add   Right -1.00 +0.75 014 +2.75   Left -1.00 +1.50 148 +2.75           IMAGING AND PROCEDURES  Imaging and Procedures for 06/25/2022  OCT, Retina - OU - Both Eyes       Right Eye Quality was good. Central Foveal Thickness: 260. Progression has been stable. Findings include normal foveal contour, no IRF, no SRF, myopic contour, retinal drusen .   Left Eye Quality was good. Central Foveal Thickness: 287. Progression has improved. Findings include no SRF, abnormal foveal contour, myopic contour, intraretinal hyper-reflective material, intraretinal fluid (mild interval improvement in cystic changes temporal fovea, stable improvement in superior IRF/IRHM).   Notes *Images captured and stored on  drive  Diagnosis / Impression:  OD: NFP, no IRF/SRF OS: mild interval improvement in cystic changes temporal fovea, stable improvement in superior IRF/IRHM  Clinical management:  See below  Abbreviations: NFP - Normal foveal profile. CME - cystoid macular edema. PED - pigment epithelial detachment. IRF - intraretinal fluid. SRF - subretinal fluid. EZ - ellipsoid zone. ERM - epiretinal membrane. ORA - outer retinal atrophy. ORT - outer retinal tubulation. SRHM - subretinal hyper-reflective material. IRHM - intraretinal hyper-reflective material      Intravitreal Injection, Pharmacologic Agent - OS - Left Eye       Time Out 06/25/2022. 10:08 AM. Confirmed correct patient, procedure, site, and patient consented.   Anesthesia Topical anesthesia was used. Anesthetic medications included Lidocaine 2%, Proparacaine 0.5%.   Procedure Preparation included 5% betadine to ocular surface, eyelid speculum. A (32g) needle was used.   Injection: 2 mg aflibercept 2 MG/0.05ML   Route: Intravitreal, Site: Left Eye   NDC: [id number], Lot: [id number], Expiration date: 01/29/2023, Waste: 0 mL   Post-op Post injection exam found visual acuity of at least counting fingers. The patient tolerated the procedure well. There were no complications. The patient received written and verbal post procedure care education.             ASSESSMENT/PLAN:    ICD-10-CM   1. Branch retinal vein occlusion of left eye with macular edema  H34.8320 OCT, Retina - OU - Both Eyes    Intravitreal Injection, Pharmacologic Agent - OS - Left Eye    aflibercept (EYLEA) SOLN 2 mg    2. Essential hypertension  I10  3. Hypertensive retinopathy of both eyes  H35.033     4. Pseudophakia of both eyes  Z96.1      Superior HRVO w/ CME OS - s/p IVA OS #1 (10.09.23), #2 (11.06.23), #3 (12.04.23), #4 (01.02.24), #5 (01.30.24) -- IVA resistance - s/p IVE OS #1 (02.28.24) - BCVA OS 20/25 - OCT shows mild interval  improvement in cystic changes temporal fovea, stable improvement in superior IRF/IRHM at 4 wks - recommend IVE OS #2 today, 03.27.24 w/ f/u in 4 wks - RBA of procedure discussed, questions answered - Eylea informed consent obtained and signed OS 02.28.24 - Avastin informed consent obtained and signed OS 10.09.23 - see procedure note - Eylea prior auth obtained - F/U 4 weeks -- DFE/OCT/possible injection  2,3. Hypertensive retinopathy OU - discussed importance of tight BP control - continue to monitor  4. Pseudophakia OU  - s/p CE/toric IOL OU  - IOLs in good position, doing well  - continue to monitor  Ophthalmic Meds Ordered this visit:  Meds ordered this encounter  Medications   aflibercept (EYLEA) SOLN 2 mg     Return in about 4 weeks (around 07/23/2022) for f/u HRVO OS, DFE, OCT, Possible, IVE, OS.  There are no Patient Instructions on file for this visit.  Explained the diagnoses, plan, and follow up with the patient and they expressed understanding.  Patient expressed understanding of the importance of proper follow up care.   This document serves as a record of services personally performed by Gardiner Sleeper, MD, PhD. It was created on their behalf by Orvan Falconer, an ophthalmic technician. The creation of this record is the provider's dictation and/or activities during the visit.    Electronically signed by: Orvan Falconer, OA, 06/26/22  1:05 PM  This document serves as a record of services personally performed by Gardiner Sleeper, MD, PhD. It was created on their behalf by Renaldo Reel, [place] an ophthalmic technician. The creation of this record is the provider's dictation and/or activities during the visit.    Electronically signed by:  Renaldo Reel, COT  03.27.24 1:05 PM   Gardiner Sleeper, M.D., Ph.D. Diseases & Surgery of the Retina and [place]  I have reviewed the above documentation for accuracy and  completeness, and I agree with the above. Gardiner Sleeper, M.D., Ph.D. 06/26/22 1:06 PM  Abbreviations: M myopia (nearsighted); A astigmatism; H hyperopia (farsighted); P presbyopia; Mrx spectacle prescription;  CTL contact lenses; OD right eye; OS left eye; OU both eyes  XT exotropia; ET esotropia; PEK punctate epithelial keratitis; PEE punctate epithelial erosions; DES dry eye syndrome; MGD meibomian gland dysfunction; ATs artificial tears; PFAT's preservative free artificial tears; [place] nuclear sclerotic cataract; PSC posterior subcapsular cataract; ERM epi-retinal membrane; PVD posterior vitreous detachment; RD retinal detachment; DM diabetes mellitus; DR diabetic retinopathy; NPDR non-proliferative diabetic retinopathy; PDR proliferative diabetic retinopathy; CSME clinically significant macular edema; DME diabetic macular edema; dbh dot blot hemorrhages; CWS cotton wool spot; POAG primary open angle glaucoma; C/D cup-to-disc ratio; HVF humphrey visual field; GVF goldmann visual field; OCT optical coherence tomography; IOP intraocular pressure; BRVO Branch retinal vein occlusion; CRVO central retinal vein occlusion; CRAO central retinal artery occlusion; BRAO branch retinal artery occlusion; RT retinal tear; SB scleral buckle; PPV pars plana vitrectomy; VH Vitreous hemorrhage; PRP panretinal laser photocoagulation; IVK intravitreal kenalog; VMT vitreomacular traction; MH Macular hole;  NVD neovascularization of the disc; NVE neovascularization elsewhere; AREDS age related eye disease study; ARMD age  related macular degeneration; POAG primary open angle glaucoma; EBMD epithelial/anterior basement membrane dystrophy; ACIOL anterior chamber intraocular lens; IOL intraocular lens; PCIOL posterior chamber intraocular lens; Phaco/IOL phacoemulsification with intraocular lens placement; [place] photorefractive keratectomy; LASIK laser assisted in situ keratomileusis; HTN hypertension; DM diabetes mellitus; COPD chronic  obstructive pulmonary disease

## 2022-06-25 ENCOUNTER — Encounter (INDEPENDENT_AMBULATORY_CARE_PROVIDER_SITE_OTHER): Payer: Self-pay | Admitting: Ophthalmology

## 2022-06-25 ENCOUNTER — Ambulatory Visit (INDEPENDENT_AMBULATORY_CARE_PROVIDER_SITE_OTHER): Payer: Medicare Other | Admitting: Ophthalmology

## 2022-06-25 DIAGNOSIS — H35033 Hypertensive retinopathy, bilateral: Secondary | ICD-10-CM

## 2022-06-25 DIAGNOSIS — I10 Essential (primary) hypertension: Secondary | ICD-10-CM | POA: Diagnosis not present

## 2022-06-25 DIAGNOSIS — Z961 Presence of intraocular lens: Secondary | ICD-10-CM | POA: Diagnosis not present

## 2022-06-25 DIAGNOSIS — H34832 Tributary (branch) retinal vein occlusion, left eye, with macular edema: Secondary | ICD-10-CM

## 2022-06-25 MED ORDER — AFLIBERCEPT 2MG/0.05ML IZ SOLN FOR KALEIDOSCOPE
2.0000 mg | INTRAVITREAL | Status: AC | PRN
  Administered 2022-06-25: 2 mg via INTRAVITREAL

## 2022-07-17 NOTE — Progress Notes (Signed)
[facility] - Clinic Note  07/23/2022    CHIEF COMPLAINT Patient presents for Retina Follow Up  HISTORY OF PRESENT ILLNESS: Glenn Hunt. is a 89 y.o. male who presents to the clinic today for:   HPI     Retina Follow Up   Patient presents with  Other.  In left eye.  This started 4 weeks ago.  I, the attending physician,  performed the HPI with the patient and updated documentation appropriately.        Comments   Patient here for 4 weeks retina follow up for HRVO OS. Patient states vision no trouble. No eye pain.       Last edited by Rennis Chris, MD on 07/23/2022 11:06 AM.      Referring physician: Sinda Du, MD [address] [place],  [place] [address]  HISTORICAL INFORMATION:   Selected notes from the MEDICAL RECORD NUMBER Referred by Dr. Cathey Endow for HRVO LEE:  Ocular Hx- PMH-    CURRENT MEDICATIONS: No current outpatient medications on file. (Ophthalmic Drugs)   No current facility-administered medications for this visit. (Ophthalmic Drugs)   Current Outpatient Medications (Other)  Medication Sig   acetaminophen (TYLENOL) 500 MG tablet Take 1,000 mg by mouth every 8 (eight) hours as needed for moderate pain.   allopurinol (ZYLOPRIM) 300 MG tablet Take 300 mg by mouth daily.    ibuprofen (ADVIL) 200 MG tablet Take 600 mg by mouth every 8 (eight) hours as needed for moderate pain.   Multiple Vitamins-Minerals (MULTIVITAMIN ADULT PO) Take 1 tablet by mouth daily.   potassium chloride (KLOR-CON) 10 MEQ tablet Take 10 mEq by mouth 2 (two) times daily.   torsemide (DEMADEX) 10 MG tablet Take 5 mg by mouth daily as needed (fluid).   triamterene-hydrochlorothiazide (MAXZIDE-25) 37.5-25 MG tablet Take 1 tablet by mouth daily.   ondansetron (ZOFRAN ODT) 4 MG disintegrating tablet Take 1 tablet (4 mg total) by mouth every 8 (eight) hours as needed for nausea or vomiting. (Patient not taking: Reported on 01/06/2022)   No current  facility-administered medications for this visit. (Other)   REVIEW OF SYSTEMS: ROS   Positive for: Musculoskeletal, Eyes Negative for: Constitutional, Gastrointestinal, Neurological, Skin, Genitourinary, HENT, Endocrine, Cardiovascular, Respiratory, Psychiatric, Allergic/Imm, Heme/Lymph Last edited by Laddie Aquas, COA on 07/23/2022  9:35 AM.        ALLERGIES Allergies  Allergen Reactions   Famotidine Other (See Comments)   Tetracycline Hcl Other (See Comments)   Tetracyclines & Related Other (See Comments)    Infection of penis   PAST MEDICAL HISTORY Past Medical History:  Diagnosis Date   Anemia    Arthritis    CAD (coronary artery disease)    2007, inferior ischemia on nuclear scan, catheterization showed  60-70% ostial PDA, 90% distal RCA and a very large right coronary artery ( vision stent was placed 3.5 x 23 mm), 80-90% diagonal ( small vessel, wire could not be passed patient followed medically)   CAD (coronary artery disease)    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; 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 but considers himslef stable , he denies chest pain , headache, sob  , reports he does "heavy exercise " states " i work out with a trainer 2-3 times a week and go walking but i havent done much since the gyms closed "    Cancer of kidney    Creatinine elevation    Dieulafoy lesion  of stomach    incidence of bleeds in 2015 and 2017 , required blood transfusion as bleed lef to a 4.5 hemoglobin   Gout    H/O right nephrectomy    History of blood transfusion    Hypertension    Skin cancer    skin , squamous cell    Tuberculosis 1962   Varicose veins of legs    bialtera; "ive had them about 5 years now and theyve never been a problem"    Past Surgical History:  Procedure Laterality Date   CATARACT EXTRACTION, BILATERAL  2017   COLONOSCOPY     CORONARY ANGIOPLASTY WITH STENT PLACEMENT  2006   ESOPHAGOGASTRODUODENOSCOPY  04/20/2012   Procedure:  ESOPHAGOGASTRODUODENOSCOPY (EGD);  Surgeon: Graylin Shiver, MD;  Location: [facility] ENDOSCOPY;  Service: Endoscopy;  Laterality: N/A;   NEPHRECTOMY Right    REPORTS AVG CREATININE 1.4 FOR THE LAST 20 YEARS    REVERSE SHOULDER ARTHROPLASTY Right 02/14/2020   Procedure: REVERSE SHOULDER ARTHROPLASTY;  Surgeon: Yolonda Kida, MD;  Location: [facility] ORS;  Service: Orthopedics;  Laterality: Right;  2.5 hrs   TOTAL KNEE ARTHROPLASTY Right 08/26/2018   Procedure: TOTAL KNEE ARTHROPLASTY;  Surgeon: Durene Romans, MD;  Location: [facility] ORS;  Service: Orthopedics;  Laterality: Right;  70 mins   FAMILY HISTORY History reviewed. No pertinent family history.  SOCIAL HISTORY Social History   Tobacco Use   Smoking status: Former    Types: Cigarettes    Quit date: 04/01/2021    Years since quitting: 1.3   Smokeless tobacco: Never   Tobacco comments:    7 cigarettes a day fo the last 10 years   Vaping Use   Vaping Use: Never used  Substance Use Topics   Alcohol use: Yes    Alcohol/week: 7.0 standard drinks of alcohol    Types: 7 Glasses of wine per week    Comment: daily    Drug use: No       OPHTHALMIC EXAM:  Base Eye Exam     Visual Acuity (Snellen - Linear)       Right Left   Dist cc 20/20 -1 20/20 -2    Correction: Glasses         Tonometry (Tonopen, 9:33 AM)       Right Left   Pressure 15 14         Pupils       Dark Light Shape React APD   Right 3 2 Round Brisk None   Left 3 2 Round Brisk None         Visual Fields (Counting fingers)       Left Right    Full Full         Extraocular Movement       Right Left    Full, Ortho Full, Ortho         Neuro/Psych     Oriented x3: Yes   Mood/Affect: Normal         Dilation     Both eyes: 1.0% Mydriacyl, 2.5% Phenylephrine @ 9:33 AM           Slit Lamp and Fundus Exam     Slit Lamp Exam       Right Left   Lids/Lashes Dermatochalasis - upper lid Dermatochalasis - upper lid   Conjunctiva/Sclera Mild  conjunctivochalasis Mild conjunctivochalasis   Cornea 1+ Punctate epithelial erosions 1+ Punctate epithelial erosions   Anterior Chamber deep and clear deep and clear  Iris Round and dilated Round and dilated   Lens Toric PC IOL in good position with marks at 0300 and 0900 Toric PC IOL in good position with marks at 0300 and 0900   Anterior Vitreous Vitreous syneresis, PVD, vit condensations Vitreous syneresis, Posterior vitreous detachment         Fundus Exam       Right Left   Disc mild Pallor, Sharp rim Pink and Sharp, +hyperemia -- improving, vascular loops   C/D Ratio 0.7 0.6   Macula Flat, good foveal reflex, RPE mottling and clumping, No heme or edema Blunted foveal reflex, confluent IRH superior macula, +CWS, +flame hemes -- all improving, central cystic changes -- persistent   Vessels attenuated, Tortuous attenuated, Tortuous, superior HRVO   Periphery Attached Attached, superior DBH, pigmented paving stone degeneration inferiorly           Refraction     Wearing Rx       Sphere Cylinder Axis Add   Right -1.00 +0.75 014 +2.75   Left -1.00 +1.50 148 +2.75           IMAGING AND PROCEDURES  Imaging and Procedures for 07/23/2022  OCT, Retina - OU - Both Eyes       Right Eye Quality was good. Central Foveal Thickness: 265. Progression has been stable. Findings include normal foveal contour, no IRF, no SRF, myopic contour, retinal drusen .   Left Eye Quality was good. Central Foveal Thickness: 285. Progression has been stable. Findings include no SRF, abnormal foveal contour, myopic contour, intraretinal hyper-reflective material, intraretinal fluid (Persistent cystic changes temporal fovea, stable improvement in superior IRF/IRHM).   Notes *Images captured and stored on drive  Diagnosis / Impression:  OD: NFP, no IRF/SRF OS: persistent cystic changes temporal fovea, stable improvement in superior IRF/IRHM  Clinical management:  See below  Abbreviations:  NFP - Normal foveal profile. CME - cystoid macular edema. PED - pigment epithelial detachment. IRF - intraretinal fluid. SRF - subretinal fluid. EZ - ellipsoid zone. ERM - epiretinal membrane. ORA - outer retinal atrophy. ORT - outer retinal tubulation. SRHM - subretinal hyper-reflective material. IRHM - intraretinal hyper-reflective material      Intravitreal Injection, Pharmacologic Agent - OS - Left Eye       Time Out 07/23/2022. 10:13 AM. Confirmed correct patient, procedure, site, and patient consented.   Anesthesia Topical anesthesia was used. Anesthetic medications included Lidocaine 2%, Proparacaine 0.5%.   Procedure Preparation included 5% betadine to ocular surface, eyelid speculum. A (32g) needle was used.   Injection: 2 mg aflibercept 2 MG/0.05ML   Route: Intravitreal, Site: Left Eye   NDC: L6038910, Lot: [id number], Expiration date: 06/29/2023, Waste: 0 mL   Post-op Post injection exam found visual acuity of at least counting fingers. The patient tolerated the procedure well. There were no complications. The patient received written and verbal post procedure care education.              ASSESSMENT/PLAN:    ICD-10-CM   1. Branch retinal vein occlusion of left eye with macular edema  H34.8320 OCT, Retina - OU - Both Eyes    Intravitreal Injection, Pharmacologic Agent - OS - Left Eye    aflibercept (EYLEA) SOLN 2 mg    2. Essential hypertension  I10     3. Hypertensive retinopathy of both eyes  H35.033     4. Pseudophakia of both eyes  Z96.1       Superior HRVO w/ CME OS - s/p IVA  OS #1 (10.09.23), #2 (11.06.23), #3 (12.04.23), #4 (01.02.24), #5 (01.30.24) -- IVA resistance - s/p IVE OS #1 (02.28.24), #2 (03.27.24) - BCVA OS 20/25 - OCT shows persistent cystic changes temporal fovea, stable improvement in superior IRF/IRHM at 4 wks - recommend IVE OS #3 today, 04.24.24 w/ f/u in 4 wks - RBA of procedure discussed, questions answered - Eylea informed  consent obtained and signed OS 02.28.24 - Avastin informed consent obtained and signed OS 10.09.23 - see procedure note - Eylea prior auth obtained - F/U 4 weeks -- DFE/OCT/possible injection  2,3. Hypertensive retinopathy OU - discussed importance of tight BP control - continue to monitor  4. Pseudophakia OU  - s/p CE/toric IOL OU  - IOLs in good position, doing well  - continue to monitor  Ophthalmic Meds Ordered this visit:  Meds ordered this encounter  Medications   aflibercept (EYLEA) SOLN 2 mg     Return in about 4 weeks (around 08/20/2022) for f/u HRVO OS, DFE, OCT.  There are no Patient Instructions on file for this visit.  Explained the diagnoses, plan, and follow up with the patient and they expressed understanding.  Patient expressed understanding of the importance of proper follow up care.   This document serves as a record of services personally performed by Karie Chimera, MD, PhD. It was created on their behalf by De Blanch, an ophthalmic technician. The creation of this record is the provider's dictation and/or activities during the visit.    Electronically signed by: De Blanch, OA, 07/23/22  11:21 AM  This document serves as a record of services personally performed by Karie Chimera, MD, PhD. It was created on their behalf by Glee Arvin. Manson Passey, OA an ophthalmic technician. The creation of this record is the provider's dictation and/or activities during the visit.    Electronically signed by: Glee Arvin. Manson Passey, [place] 04.24.2024 11:21 AM  Karie Chimera, M.D., Ph.D. Diseases & Surgery of the Retina and Vitreous [facility]  I have reviewed the above documentation for accuracy and completeness, and I agree with the above. Karie Chimera, M.D., Ph.D. 07/23/22 11:22 AM   Abbreviations: M myopia (nearsighted); A astigmatism; H hyperopia (farsighted); P presbyopia; Mrx spectacle prescription;  CTL contact lenses; OD right eye; OS  left eye; OU both eyes  XT exotropia; ET esotropia; PEK punctate epithelial keratitis; PEE punctate epithelial erosions; DES dry eye syndrome; MGD meibomian gland dysfunction; ATs artificial tears; PFAT's preservative free artificial tears; NSC nuclear sclerotic cataract; PSC posterior subcapsular cataract; ERM epi-retinal membrane; PVD posterior vitreous detachment; RD retinal detachment; DM diabetes mellitus; DR diabetic retinopathy; NPDR non-proliferative diabetic retinopathy; PDR proliferative diabetic retinopathy; CSME clinically significant macular edema; DME diabetic macular edema; dbh dot blot hemorrhages; CWS cotton wool spot; POAG primary open angle glaucoma; C/D cup-to-disc ratio; HVF humphrey visual field; GVF goldmann visual field; OCT optical coherence tomography; IOP intraocular pressure; BRVO Branch retinal vein occlusion; CRVO central retinal vein occlusion; CRAO central retinal artery occlusion; BRAO branch retinal artery occlusion; RT retinal tear; SB scleral buckle; PPV pars plana vitrectomy; VH Vitreous hemorrhage; PRP panretinal laser photocoagulation; IVK intravitreal kenalog; VMT vitreomacular traction; MH Macular hole;  NVD neovascularization of the disc; NVE neovascularization elsewhere; AREDS age related eye disease study; ARMD age related macular degeneration; POAG primary open angle glaucoma; EBMD epithelial/anterior basement membrane dystrophy; ACIOL anterior chamber intraocular lens; IOL intraocular lens; PCIOL posterior chamber intraocular lens; Phaco/IOL phacoemulsification with intraocular lens placement; PRK photorefractive keratectomy; LASIK  laser assisted in situ keratomileusis; HTN hypertension; DM diabetes mellitus; COPD chronic obstructive pulmonary disease

## 2022-07-23 ENCOUNTER — Encounter (INDEPENDENT_AMBULATORY_CARE_PROVIDER_SITE_OTHER): Payer: Self-pay | Admitting: Ophthalmology

## 2022-07-23 ENCOUNTER — Ambulatory Visit (INDEPENDENT_AMBULATORY_CARE_PROVIDER_SITE_OTHER): Payer: Medicare Other | Admitting: Ophthalmology

## 2022-07-23 DIAGNOSIS — Z961 Presence of intraocular lens: Secondary | ICD-10-CM

## 2022-07-23 DIAGNOSIS — H34832 Tributary (branch) retinal vein occlusion, left eye, with macular edema: Secondary | ICD-10-CM | POA: Diagnosis not present

## 2022-07-23 DIAGNOSIS — H35033 Hypertensive retinopathy, bilateral: Secondary | ICD-10-CM

## 2022-07-23 DIAGNOSIS — I10 Essential (primary) hypertension: Secondary | ICD-10-CM

## 2022-07-23 MED ORDER — AFLIBERCEPT 2MG/0.05ML IZ SOLN FOR KALEIDOSCOPE
2.0000 mg | INTRAVITREAL | Status: AC | PRN
  Administered 2022-07-23: 2 mg via INTRAVITREAL

## 2022-08-14 NOTE — Progress Notes (Signed)
[facility] - Clinic Note  08/20/2022    CHIEF COMPLAINT Patient presents for Retina Follow Up  HISTORY OF PRESENT ILLNESS: Glenn Hunt. is a 89 y.o. male who presents to the clinic today for:   HPI     Retina Follow Up   In left eye.  This started 4 weeks ago.  Duration of 4 weeks.  Since onset it is stable.  I, the attending physician,  performed the HPI with the patient and updated documentation appropriately.        Comments   4 week retina follow up HRVO OS and I'VE OS pt is reporting no vision changes noticed pt has noticed some floaters but denies any flashes of light       Last edited by Rennis Chris, MD on 08/20/2022 12:59 PM.     Referring physician: Sinda Du, MD [address] [place],  [place] [address]  HISTORICAL INFORMATION:   Selected notes from the MEDICAL RECORD NUMBER Referred by Dr. Cathey Endow for HRVO LEE:  Ocular Hx- PMH-    CURRENT MEDICATIONS: No current outpatient medications on file. (Ophthalmic Drugs)   No current facility-administered medications for this visit. (Ophthalmic Drugs)   Current Outpatient Medications (Other)  Medication Sig   acetaminophen (TYLENOL) 500 MG tablet Take 1,000 mg by mouth every 8 (eight) hours as needed for moderate pain.   allopurinol (ZYLOPRIM) 300 MG tablet Take 300 mg by mouth daily.    ibuprofen (ADVIL) 200 MG tablet Take 600 mg by mouth every 8 (eight) hours as needed for moderate pain.   Multiple Vitamins-Minerals (MULTIVITAMIN ADULT PO) Take 1 tablet by mouth daily.   ondansetron (ZOFRAN ODT) 4 MG disintegrating tablet Take 1 tablet (4 mg total) by mouth every 8 (eight) hours as needed for nausea or vomiting. (Patient not taking: Reported on 01/06/2022)   potassium chloride (KLOR-CON) 10 MEQ tablet Take 10 mEq by mouth 2 (two) times daily.   torsemide (DEMADEX) 10 MG tablet Take 5 mg by mouth daily as needed (fluid).   triamterene-hydrochlorothiazide (MAXZIDE-25) 37.5-25 MG  tablet Take 1 tablet by mouth daily.   No current facility-administered medications for this visit. (Other)   REVIEW OF SYSTEMS: ROS   Positive for: Musculoskeletal, Eyes Negative for: Constitutional, Gastrointestinal, Neurological, Skin, Genitourinary, HENT, Endocrine, Cardiovascular, Respiratory, Psychiatric, Allergic/Imm, Heme/Lymph Last edited by Etheleen Mayhew, COT on 08/20/2022  9:26 AM.         ALLERGIES Allergies  Allergen Reactions   Famotidine Other (See Comments)   Tetracycline Hcl Other (See Comments)   Tetracyclines & Related Other (See Comments)    Infection of penis   PAST MEDICAL HISTORY Past Medical History:  Diagnosis Date   Anemia    Arthritis    CAD (coronary artery disease)    2007, inferior ischemia on nuclear scan, catheterization showed  60-70% ostial PDA, 90% distal RCA and a very large right coronary artery ( vision stent was placed 3.5 x 23 mm), 80-90% diagonal ( small vessel, wire could not be passed patient followed medically)   CAD (coronary artery disease)    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; 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 but considers himslef stable , he denies chest pain , headache, sob  , reports he does "heavy exercise " states " i work out with a trainer 2-3 times a week and go walking but i havent done much since the gyms closed "  Cancer of kidney (HCC)    Creatinine elevation    Dieulafoy lesion of stomach    incidence of bleeds in 2015 and 2017 , required blood transfusion as bleed lef to a 4.5 hemoglobin   Gout    H/O right nephrectomy    History of blood transfusion    Hypertension    Skin cancer    skin , squamous cell    Tuberculosis 1962   Varicose veins of legs    bialtera; "ive had them about 5 years now and theyve never been a problem"    Past Surgical History:  Procedure Laterality Date   CATARACT EXTRACTION, BILATERAL  2017   COLONOSCOPY     CORONARY ANGIOPLASTY WITH STENT  PLACEMENT  2006   ESOPHAGOGASTRODUODENOSCOPY  04/20/2012   Procedure: ESOPHAGOGASTRODUODENOSCOPY (EGD);  Surgeon: Graylin Shiver, MD;  Location: [facility] ENDOSCOPY;  Service: Endoscopy;  Laterality: N/A;   NEPHRECTOMY Right    REPORTS AVG CREATININE 1.4 FOR THE LAST 20 YEARS    REVERSE SHOULDER ARTHROPLASTY Right 02/14/2020   Procedure: REVERSE SHOULDER ARTHROPLASTY;  Surgeon: Yolonda Kida, MD;  Location: [facility] ORS;  Service: Orthopedics;  Laterality: Right;  2.5 hrs   TOTAL KNEE ARTHROPLASTY Right 08/26/2018   Procedure: TOTAL KNEE ARTHROPLASTY;  Surgeon: Durene Romans, MD;  Location: [facility] ORS;  Service: Orthopedics;  Laterality: Right;  70 mins   FAMILY HISTORY History reviewed. No pertinent family history.  SOCIAL HISTORY Social History   Tobacco Use   Smoking status: Former    Types: Cigarettes    Quit date: 04/01/2021    Years since quitting: 1.3   Smokeless tobacco: Never   Tobacco comments:    7 cigarettes a day fo the last 10 years   Vaping Use   Vaping Use: Never used  Substance Use Topics   Alcohol use: Yes    Alcohol/week: 7.0 standard drinks of alcohol    Types: 7 Glasses of wine per week    Comment: daily    Drug use: No       OPHTHALMIC EXAM:  Base Eye Exam     Visual Acuity (Snellen - Linear)       Right Left   Dist cc 20/20 -1 20/20 -2    Correction: Glasses         Tonometry (Tonopen, 9:29 AM)       Right Left   Pressure 11 13         Pupils       Pupils Dark Light Shape React APD   Right PERRL 3 2 Round Brisk None   Left PERRL 3 2 Round Brisk None         Visual Fields       Left Right    Full Full         Extraocular Movement       Right Left    Full, Ortho Full, Ortho         Neuro/Psych     Oriented x3: Yes   Mood/Affect: Normal         Dilation     Both eyes: 2.5% Phenylephrine @ 9:29 AM           Slit Lamp and Fundus Exam     Slit Lamp Exam       Right Left   Lids/Lashes Dermatochalasis - upper lid  Dermatochalasis - upper lid   Conjunctiva/Sclera Mild conjunctivochalasis Mild conjunctivochalasis   Cornea 1+ Punctate epithelial erosions 1+ Punctate epithelial  erosions   Anterior Chamber deep and clear deep and clear   Iris Round and dilated Round and dilated   Lens Toric PC IOL in good position with marks at 0300 and 0900 Toric PC IOL in good position with marks at 0300 and 0900   Anterior Vitreous Vitreous syneresis, PVD, vit condensations Vitreous syneresis, Posterior vitreous detachment         Fundus Exam       Right Left   Disc mild Pallor, Sharp rim Pink and Sharp, +hyperemia -- improving, vascular loops   C/D Ratio 0.7 0.6   Macula Flat, good foveal reflex, RPE mottling and clumping, No heme or edema Blunted foveal reflex, confluent IRH superior macula-- vastly improved now scattered IRH greatest temporally, +CWS, +flame hemes -- essentially resolved, central cystic changes -- persistent   Vessels attenuated, Tortuous attenuated, Tortuous, superior HRVO   Periphery Attached Attached, superior DBH, pigmented paving stone degeneration inferiorly           Refraction     Wearing Rx       Sphere Cylinder Axis Add   Right -1.00 +0.75 014 +2.75   Left -1.00 +1.50 148 +2.75           IMAGING AND PROCEDURES  Imaging and Procedures for 08/20/2022  OCT, Retina - OU - Both Eyes       Right Eye Quality was good. Central Foveal Thickness: 263. Progression has been stable. Findings include normal foveal contour, no IRF, no SRF, myopic contour, retinal drusen .   Left Eye Quality was good. Central Foveal Thickness: 287. Progression has been stable. Findings include no SRF, abnormal foveal contour, myopic contour, intraretinal hyper-reflective material, intraretinal fluid (Persistent cystic changes temporal fovea, stable improvement in superior IRF/IRHM).   Notes *Images captured and stored on drive  Diagnosis / Impression:  OD: NFP, no IRF/SRF OS: persistent cystic  changes temporal fovea, stable improvement in superior IRF/IRHM  Clinical management:  See below  Abbreviations: NFP - Normal foveal profile. CME - cystoid macular edema. PED - pigment epithelial detachment. IRF - intraretinal fluid. SRF - subretinal fluid. EZ - ellipsoid zone. ERM - epiretinal membrane. ORA - outer retinal atrophy. ORT - outer retinal tubulation. SRHM - subretinal hyper-reflective material. IRHM - intraretinal hyper-reflective material      Intravitreal Injection, Pharmacologic Agent - OS - Left Eye       Time Out 08/20/2022. 10:03 AM. Confirmed correct patient, procedure, site, and patient consented.   Procedure Injection: 2 mg aflibercept 2 MG/0.05ML   Route: Intravitreal, Site: Left Eye   NDC: [id number], Lot: [id number], Expiration date: 07/27/2023, Waste: 0 mL   Post-op Post injection exam found visual acuity of at least counting fingers. The patient tolerated the procedure well. There were no complications. The patient received written and verbal post procedure care education. Post injection medications were not given.            ASSESSMENT/PLAN:    ICD-10-CM   1. Branch retinal vein occlusion of left eye with macular edema  H34.8320 OCT, Retina - OU - Both Eyes    Intravitreal Injection, Pharmacologic Agent - OS - Left Eye    aflibercept (EYLEA) SOLN 2 mg    2. Essential hypertension  I10     3. Hypertensive retinopathy of both eyes  H35.033     4. Pseudophakia of both eyes  Z96.1      Superior HRVO w/ CME OS - s/p IVA OS #1 (10.09.23), #2 (11.06.23), #3 (12.04.23), #  4 (01.02.24), #5 (01.30.24) -- IVA resistance - s/p IVE OS #1 (02.28.24), #2 (03.27.24), #3 (04.24.24) - BCVA OS 20/20 - exam shows continued improvement in diffuse retinal hemorrhages -- almost resolved - OCT shows persistent cystic changes temporal fovea, stable improvement in superior IRF/IRHM at 4 wks - recommend IVE OS #4 today, 05.22.24 w/ f/u in 4 wks - RBA of procedure  discussed, questions answered - Eylea informed consent obtained and signed OS 02.28.24 - Avastin informed consent obtained and signed OS 10.09.23 - see procedure note - Eylea prior auth obtained - F/U 4 weeks -- DFE/OCT/possible injection  2,3. Hypertensive retinopathy OU - discussed importance of tight BP control - continue to monitor  4. Pseudophakia OU  - s/p CE/toric IOL OU  - IOLs in good position, doing well  - continue to monitor  Ophthalmic Meds Ordered this visit:  Meds ordered this encounter  Medications   aflibercept (EYLEA) SOLN 2 mg     Return in about 4 weeks (around 09/17/2022) for f/u HRVO w/ CME OS, DFE, OCT, Possible, IVE, OS.  There are no Patient Instructions on file for this visit.  Explained the diagnoses, plan, and follow up with the patient and they expressed understanding.  Patient expressed understanding of the importance of proper follow up care.   This document serves as a record of services personally performed by Karie Chimera, MD, PhD. It was created on their behalf by De Blanch, an ophthalmic technician. The creation of this record is the provider's dictation and/or activities during the visit.    Electronically signed by: De Blanch, OA, 08/20/22  1:00 PM  This document serves as a record of services personally performed by Karie Chimera, MD, PhD. It was created on their behalf by Gerilyn Nestle, COT an ophthalmic technician. The creation of this record is the provider's dictation and/or activities during the visit.    Electronically signed by:  Gerilyn Nestle, COT  5.22.24 1:00 PM  Karie Chimera, M.D., Ph.D. Diseases & Surgery of the Retina and Vitreous [facility]  I have reviewed the above documentation for accuracy and completeness, and I agree with the above. Karie Chimera, M.D., Ph.D. 08/20/22 1:01 PM  Abbreviations: M myopia (nearsighted); A astigmatism; H hyperopia (farsighted); P  presbyopia; Mrx spectacle prescription;  CTL contact lenses; OD right eye; OS left eye; OU both eyes  XT exotropia; ET esotropia; PEK punctate epithelial keratitis; PEE punctate epithelial erosions; DES dry eye syndrome; MGD meibomian gland dysfunction; ATs artificial tears; PFAT's preservative free artificial tears; NSC nuclear sclerotic cataract; PSC posterior subcapsular cataract; ERM epi-retinal membrane; PVD posterior vitreous detachment; RD retinal detachment; DM diabetes mellitus; DR diabetic retinopathy; NPDR non-proliferative diabetic retinopathy; PDR proliferative diabetic retinopathy; CSME clinically significant macular edema; DME diabetic macular edema; dbh dot blot hemorrhages; CWS cotton wool spot; POAG primary open angle glaucoma; C/D cup-to-disc ratio; HVF humphrey visual field; GVF goldmann visual field; OCT optical coherence tomography; IOP intraocular pressure; BRVO Branch retinal vein occlusion; CRVO central retinal vein occlusion; CRAO central retinal artery occlusion; BRAO branch retinal artery occlusion; RT retinal tear; SB scleral buckle; PPV pars plana vitrectomy; VH Vitreous hemorrhage; PRP panretinal laser photocoagulation; IVK intravitreal kenalog; VMT vitreomacular traction; MH Macular hole;  NVD neovascularization of the disc; NVE neovascularization elsewhere; AREDS age related eye disease study; ARMD age related macular degeneration; POAG primary open angle glaucoma; EBMD epithelial/anterior basement membrane dystrophy; ACIOL anterior chamber intraocular lens; IOL intraocular lens; PCIOL posterior chamber intraocular  lens; Phaco/IOL phacoemulsification with intraocular lens placement; PRK photorefractive keratectomy; LASIK laser assisted in situ keratomileusis; HTN hypertension; DM diabetes mellitus; COPD chronic obstructive pulmonary disease

## 2022-08-20 ENCOUNTER — Encounter (INDEPENDENT_AMBULATORY_CARE_PROVIDER_SITE_OTHER): Payer: Self-pay | Admitting: Ophthalmology

## 2022-08-20 ENCOUNTER — Ambulatory Visit (INDEPENDENT_AMBULATORY_CARE_PROVIDER_SITE_OTHER): Payer: Medicare Other | Admitting: Ophthalmology

## 2022-08-20 DIAGNOSIS — Z961 Presence of intraocular lens: Secondary | ICD-10-CM | POA: Diagnosis not present

## 2022-08-20 DIAGNOSIS — H34832 Tributary (branch) retinal vein occlusion, left eye, with macular edema: Secondary | ICD-10-CM | POA: Diagnosis not present

## 2022-08-20 DIAGNOSIS — I10 Essential (primary) hypertension: Secondary | ICD-10-CM

## 2022-08-20 DIAGNOSIS — H35033 Hypertensive retinopathy, bilateral: Secondary | ICD-10-CM | POA: Diagnosis not present

## 2022-08-20 MED ORDER — AFLIBERCEPT 2MG/0.05ML IZ SOLN FOR KALEIDOSCOPE
2.0000 mg | INTRAVITREAL | Status: AC | PRN
  Administered 2022-08-20: 2 mg via INTRAVITREAL

## 2022-09-04 NOTE — Progress Notes (Signed)
[facility] - Clinic Note  09/18/2022    CHIEF COMPLAINT Patient presents for Retina Follow Up  HISTORY OF PRESENT ILLNESS: Glenn Hunt. is a 89 y.o. male who presents to the clinic today for:   HPI     Retina Follow Up   Patient presents with  CRVO/BRVO.  In left eye.  This started 4 weeks ago.  Duration of 4 weeks.  Since onset it is stable.  I, the attending physician,  performed the HPI with the patient and updated documentation appropriately.        Comments   4 week retina follow up BRVO OS and I'VE OS pt is reporting no vision changes noticed he denies any flashes or floaters       Last edited by Rennis Chris, MD on 09/18/2022 10:52 AM.    Pt states vision is fine  Referring physician: Sinda Du, MD [address] [place],  [place] [address]  HISTORICAL INFORMATION:   Selected notes from the MEDICAL RECORD NUMBER Referred by Dr. Cathey Endow for HRVO LEE:  Ocular Hx- PMH-    CURRENT MEDICATIONS: No current outpatient medications on file. (Ophthalmic Drugs)   No current facility-administered medications for this visit. (Ophthalmic Drugs)   Current Outpatient Medications (Other)  Medication Sig   acetaminophen (TYLENOL) 500 MG tablet Take 1,000 mg by mouth every 8 (eight) hours as needed for moderate pain.   allopurinol (ZYLOPRIM) 300 MG tablet Take 300 mg by mouth daily.    ibuprofen (ADVIL) 200 MG tablet Take 600 mg by mouth every 8 (eight) hours as needed for moderate pain.   Multiple Vitamins-Minerals (MULTIVITAMIN ADULT PO) Take 1 tablet by mouth daily.   ondansetron (ZOFRAN ODT) 4 MG disintegrating tablet Take 1 tablet (4 mg total) by mouth every 8 (eight) hours as needed for nausea or vomiting. (Patient not taking: Reported on 01/06/2022)   potassium chloride (KLOR-CON) 10 MEQ tablet Take 10 mEq by mouth 2 (two) times daily.   torsemide (DEMADEX) 10 MG tablet Take 5 mg by mouth daily as needed (fluid).    triamterene-hydrochlorothiazide (MAXZIDE-25) 37.5-25 MG tablet Take 1 tablet by mouth daily.   No current facility-administered medications for this visit. (Other)   REVIEW OF SYSTEMS: ROS   Positive for: Musculoskeletal, Eyes Negative for: Constitutional, Gastrointestinal, Neurological, Skin, Genitourinary, HENT, Endocrine, Cardiovascular, Respiratory, Psychiatric, Allergic/Imm, Heme/Lymph Last edited by Etheleen Mayhew, COT on 09/18/2022  8:49 AM.     ALLERGIES Allergies  Allergen Reactions   Famotidine Other (See Comments)   Tetracycline Hcl Other (See Comments)   Tetracyclines & Related Other (See Comments)    Infection of penis   PAST MEDICAL HISTORY Past Medical History:  Diagnosis Date   Anemia    Arthritis    CAD (coronary artery disease)    2007, inferior ischemia on nuclear scan, catheterization showed  60-70% ostial PDA, 90% distal RCA and a very large right coronary artery ( vision stent was placed 3.5 x 23 mm), 80-90% diagonal ( small vessel, wire could not be passed patient followed medically)   CAD (coronary artery disease)    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; 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 but considers himslef stable , he denies chest pain , headache, sob  , reports he does "heavy exercise " states " i work out with a trainer 2-3 times a week and go walking but i havent done much since the gyms closed "  Cancer of kidney (HCC)    Creatinine elevation    Dieulafoy lesion of stomach    incidence of bleeds in 2015 and 2017 , required blood transfusion as bleed lef to a 4.5 hemoglobin   Gout    H/O right nephrectomy    History of blood transfusion    Hypertension    Skin cancer    skin , squamous cell    Tuberculosis 1962   Varicose veins of legs    bialtera; "ive had them about 5 years now and theyve never been a problem"    Past Surgical History:  Procedure Laterality Date   CATARACT EXTRACTION, BILATERAL  2017    COLONOSCOPY     CORONARY ANGIOPLASTY WITH STENT PLACEMENT  2006   ESOPHAGOGASTRODUODENOSCOPY  04/20/2012   Procedure: ESOPHAGOGASTRODUODENOSCOPY (EGD);  Surgeon: Graylin Shiver, MD;  Location[facility] ENDOSCOPY;  Service: Endoscopy;  Laterality: N/A;   NEPHRECTOMY Right    REPORTS AVG CREATININE 1.4 FOR THE LAST 20 YEARS    REVERSE SHOULDER ARTHROPLASTY Right 02/14/2020   Procedure: REVERSE SHOULDER ARTHROPLASTY;  Surgeon: Yolonda Kida, MD;  Location: [facility] ORS;  Service: Orthopedics;  Laterality: Right;  2.5 hrs   TOTAL KNEE ARTHROPLASTY Right 08/26/2018   Procedure: TOTAL KNEE ARTHROPLASTY;  Surgeon: Durene Romans, MD;  Location: [facility] ORS;  Service: Orthopedics;  Laterality: Right;  70 mins   FAMILY HISTORY History reviewed. No pertinent family history.  SOCIAL HISTORY Social History   Tobacco Use   Smoking status: Former    Types: Cigarettes    Quit date: 04/01/2021    Years since quitting: 1.4   Smokeless tobacco: Never   Tobacco comments:    7 cigarettes a day fo the last 10 years   Vaping Use   Vaping Use: Never used  Substance Use Topics   Alcohol use: Yes    Alcohol/week: 7.0 standard drinks of alcohol    Types: 7 Glasses of wine per week    Comment: daily    Drug use: No       OPHTHALMIC EXAM:  Base Eye Exam     Visual Acuity (Snellen - Linear)       Right Left   Dist cc 20/20 -2 20/25   Dist ph cc  NI         Tonometry (Tonopen, 8:54 AM)       Right Left   Pressure 16 15         Pupils       Pupils Dark Light Shape React APD   Right PERRL 3 2 Round Brisk None   Left PERRL 3 2 Round Brisk None         Visual Fields       Left Right    Full Full         Extraocular Movement       Right Left    Full, Ortho Full, Ortho         Neuro/Psych     Oriented x3: Yes   Mood/Affect: Normal         Dilation     Both eyes: 2.5% Phenylephrine @ 8:54 AM           Slit Lamp and Fundus Exam     Slit Lamp Exam       Right Left    Lids/Lashes Dermatochalasis - upper lid Dermatochalasis - upper lid   Conjunctiva/Sclera Mild conjunctivochalasis Mild conjunctivochalasis   Cornea 1+ Punctate epithelial erosions 1+ Punctate  epithelial erosions   Anterior Chamber deep and clear deep and clear   Iris Round and dilated Round and dilated   Lens Toric PC IOL in good position with marks at 0300 and 0900 Toric PC IOL in good position with marks at 0300 and 0900   Anterior Vitreous Vitreous syneresis, PVD, vit condensations Vitreous syneresis, Posterior vitreous detachment         Fundus Exam       Right Left   Disc mild Pallor, Sharp rim Pink and Sharp, +hyperemia -- improving, vascular loops   C/D Ratio 0.7 0.6   Macula Flat, good foveal reflex, RPE mottling and clumping, No heme or edema Blunted foveal reflex, confluent IRH superior macula -- vastly improved now scattered IRH greatest temporally, +CWS -- improved, central cystic changes -- persistent   Vessels attenuated, Tortuous attenuated, Tortuous, superior HRVO   Periphery Attached, No heme Attached, superior DBH -- improving, pigmented paving stone degeneration inferiorly           Refraction     Wearing Rx       Sphere Cylinder Axis Add   Right -1.00 +0.75 014 +2.75   Left -1.00 +1.50 148 +2.75           IMAGING AND PROCEDURES  Imaging and Procedures for 09/18/2022  OCT, Retina - OU - Both Eyes       Right Eye Quality was good. Central Foveal Thickness: 266. Progression has been stable. Findings include normal foveal contour, no IRF, no SRF, myopic contour, retinal drusen .   Left Eye Quality was good. Central Foveal Thickness: 288. Progression has been stable. Findings include no SRF, abnormal foveal contour, myopic contour, intraretinal hyper-reflective material, intraretinal fluid (Persistent cystic changes temporal fovea, stable improvement in superior IRF/IRHM).   Notes *Images captured and stored on drive  Diagnosis / Impression:  OD:  NFP, no IRF/SRF OS: persistent cystic changes temporal fovea, stable improvement in superior IRF/IRHM  Clinical management:  See below  Abbreviations: NFP - Normal foveal profile. CME - cystoid macular edema. PED - pigment epithelial detachment. IRF - intraretinal fluid. SRF - subretinal fluid. EZ - ellipsoid zone. ERM - epiretinal membrane. ORA - outer retinal atrophy. ORT - outer retinal tubulation. SRHM - subretinal hyper-reflective material. IRHM - intraretinal hyper-reflective material      Intravitreal Injection, Pharmacologic Agent - OS - Left Eye       Time Out 09/18/2022. 9:19 AM. Confirmed correct patient, procedure, site, and patient consented.   Anesthesia Topical anesthesia was used. Anesthetic medications included Lidocaine 2%, Proparacaine 0.5%.   Procedure Preparation included 5% betadine to ocular surface, eyelid speculum. A (32g) needle was used.   Injection: 2 mg aflibercept 2 MG/0.05ML   Route: Intravitreal, Site: Left Eye   NDC: L6038910, Lot: [id number], Expiration date: 08/29/2023, Waste: 0 mL   Post-op Post injection exam found visual acuity of at least counting fingers. The patient tolerated the procedure well. There were no complications. The patient received written and verbal post procedure care education. Post injection medications were not given.            ASSESSMENT/PLAN:    ICD-10-CM   1. Branch retinal vein occlusion of left eye with macular edema  H34.8320 OCT, Retina - OU - Both Eyes    Intravitreal Injection, Pharmacologic Agent - OS - Left Eye    aflibercept (EYLEA) SOLN 2 mg    2. Essential hypertension  I10     3. Hypertensive retinopathy of both eyes  H35.033     4. Pseudophakia of both eyes  Z96.1      Superior HRVO w/ CME OS - s/p IVA OS #1 (10.09.23), #2 (11.06.23), #3 (12.04.23), #4 (01.02.24), #5 (01.30.24) -- IVA resistance - s/p IVE OS #1 (02.28.24), #2 (03.27.24), #3 (04.24.24), #4 (05.22.24) - BCVA OS 20/20 -  exam shows continued improvement in diffuse retinal hemorrhages -- almost resolved - OCT shows persistent cystic changes temporal fovea, stable improvement in superior IRF/IRHM at 4 wks - recommend IVE OS #5 today, 06.20.24 w/ f/u in 4 wks - RBA of procedure discussed, questions answered - Eylea informed consent obtained and signed OS 02.28.24 - Avastin informed consent obtained and signed OS 10.09.23 - see procedure note - Eylea prior auth obtained - F/U 4 weeks -- DFE/OCT/possible injection  2,3. Hypertensive retinopathy OU - discussed importance of tight BP control - continue to monitor  4. Pseudophakia OU  - s/p CE/toric IOL OU  - IOLs in good position, doing well  - continue to monitor  Ophthalmic Meds Ordered this visit:  Meds ordered this encounter  Medications   aflibercept (EYLEA) SOLN 2 mg     Return in about 4 weeks (around 10/16/2022) for f/u HRVO OS, DFE, OCT.  There are no Patient Instructions on file for this visit.  Explained the diagnoses, plan, and follow up with the patient and they expressed understanding.  Patient expressed understanding of the importance of proper follow up care.   This document serves as a record of services personally performed by Karie Chimera, MD, PhD. It was created on their behalf by Glee Arvin. Manson Passey, OA an ophthalmic technician. The creation of this record is the provider's dictation and/or activities during the visit.    Electronically signed by: Glee Arvin. Manson Passey, [place] 06.06.2024 1:45 AM  Karie Chimera, M.D., Ph.D. Diseases & Surgery of the Retina and Vitreous [facility]  I have reviewed the above documentation for accuracy and completeness, and I agree with the above. Karie Chimera, M.D., Ph.D. 09/19/22 1:46 AM   Abbreviations: M myopia (nearsighted); A astigmatism; H hyperopia (farsighted); P presbyopia; Mrx spectacle prescription;  CTL contact lenses; OD right eye; OS left eye; OU both eyes  XT  exotropia; ET esotropia; PEK punctate epithelial keratitis; PEE punctate epithelial erosions; DES dry eye syndrome; MGD meibomian gland dysfunction; ATs artificial tears; PFAT's preservative free artificial tears; NSC nuclear sclerotic cataract; PSC posterior subcapsular cataract; ERM epi-retinal membrane; PVD posterior vitreous detachment; RD retinal detachment; DM diabetes mellitus; DR diabetic retinopathy; NPDR non-proliferative diabetic retinopathy; PDR proliferative diabetic retinopathy; CSME clinically significant macular edema; DME diabetic macular edema; dbh dot blot hemorrhages; CWS cotton wool spot; POAG primary open angle glaucoma; C/D cup-to-disc ratio; HVF humphrey visual field; GVF goldmann visual field; OCT optical coherence tomography; IOP intraocular pressure; BRVO Branch retinal vein occlusion; CRVO central retinal vein occlusion; CRAO central retinal artery occlusion; BRAO branch retinal artery occlusion; RT retinal tear; SB scleral buckle; PPV pars plana vitrectomy; VH Vitreous hemorrhage; PRP panretinal laser photocoagulation; IVK intravitreal kenalog; VMT vitreomacular traction; MH Macular hole;  NVD neovascularization of the disc; NVE neovascularization elsewhere; AREDS age related eye disease study; ARMD age related macular degeneration; POAG primary open angle glaucoma; EBMD epithelial/anterior basement membrane dystrophy; ACIOL anterior chamber intraocular lens; IOL intraocular lens; PCIOL posterior chamber intraocular lens; Phaco/IOL phacoemulsification with intraocular lens placement; PRK photorefractive keratectomy; LASIK laser assisted in situ keratomileusis; HTN hypertension; DM diabetes mellitus; COPD chronic obstructive pulmonary disease

## 2022-09-17 ENCOUNTER — Encounter (INDEPENDENT_AMBULATORY_CARE_PROVIDER_SITE_OTHER): Payer: Medicare Other | Admitting: Ophthalmology

## 2022-09-18 ENCOUNTER — Encounter (INDEPENDENT_AMBULATORY_CARE_PROVIDER_SITE_OTHER): Payer: Self-pay | Admitting: Ophthalmology

## 2022-09-18 ENCOUNTER — Ambulatory Visit (INDEPENDENT_AMBULATORY_CARE_PROVIDER_SITE_OTHER): Payer: Medicare Other | Admitting: Ophthalmology

## 2022-09-18 DIAGNOSIS — I10 Essential (primary) hypertension: Secondary | ICD-10-CM

## 2022-09-18 DIAGNOSIS — H34832 Tributary (branch) retinal vein occlusion, left eye, with macular edema: Secondary | ICD-10-CM | POA: Diagnosis not present

## 2022-09-18 DIAGNOSIS — Z961 Presence of intraocular lens: Secondary | ICD-10-CM

## 2022-09-18 DIAGNOSIS — H35033 Hypertensive retinopathy, bilateral: Secondary | ICD-10-CM

## 2022-09-18 MED ORDER — AFLIBERCEPT 2MG/0.05ML IZ SOLN FOR KALEIDOSCOPE
2.0000 mg | INTRAVITREAL | Status: AC | PRN
  Administered 2022-09-18: 2 mg via INTRAVITREAL

## 2022-10-06 ENCOUNTER — Ambulatory Visit: Payer: Medicare Other | Admitting: Podiatry

## 2022-10-06 DIAGNOSIS — M2041 Other hammer toe(s) (acquired), right foot: Secondary | ICD-10-CM | POA: Diagnosis not present

## 2022-10-06 DIAGNOSIS — M7751 Other enthesopathy of right foot: Secondary | ICD-10-CM

## 2022-10-06 MED ORDER — TRIAMCINOLONE ACETONIDE 10 MG/ML IJ SUSP
10.0000 mg | Freq: Once | INTRAMUSCULAR | Status: AC
  Administered 2022-10-06: 10 mg

## 2022-10-06 NOTE — Progress Notes (Signed)
Subjective:   Patient ID: Glenn John., male   DOB: 89 y.o.   MRN: [id number]   HPI Patient presents stating still having a lot of problems with the second toe right foot with hammertoe deformity and has developed inflammation on the inside of the toe secondary problem that is been very sore   ROS      Objective:  Physical Exam  Neurovascular status intact with patient found to have inflamed inner phalangeal joint right second toe medial side and elevated rigidly contracted second digit     Assessment:  Inflammatory capsulitis of the inner phalangeal joint digit to right with hammertoe right second toe with moderate rigid contracture     Plan:  H&P reviewed I do think eventual digital procedure may be necessary to straighten the toe but organ to continue to try to avoid that as best as we can and I did carefully do for the medial side a sterile prep and injected the inner phalangeal joint 2 mg dexamethasone Kenalog applied cushioning to take pressure between the hallux and second toe and will be seen back as symptoms indicate

## 2022-10-08 NOTE — Progress Notes (Signed)
[facility] - Clinic Note  10/17/2022    CHIEF COMPLAINT Patient presents for Retina Follow Up  HISTORY OF PRESENT ILLNESS: Glenn Hunt. is a 89 y.o. male who presents to the clinic today for:   HPI     Retina Follow Up   Patient presents with  Other.  In both eyes.  This started 4 weeks ago.  I, the attending physician,  performed the HPI with the patient and updated documentation appropriately.        Comments   Patient here for 4 weeks retina follow up for HRVO OS. Patient state vision is fine. No eye pain. Uses AT QAM OU.      Last edited by Rennis Chris, MD on 10/17/2022 12:45 PM.     Pt states  Referring physician: Sinda Du, MD [address] [place],  [place] [address]  HISTORICAL INFORMATION:   Selected notes from the MEDICAL RECORD NUMBER Referred by Dr. Cathey Endow for HRVO LEE:  Ocular Hx- PMH-    CURRENT MEDICATIONS: No current outpatient medications on file. (Ophthalmic Drugs)   No current facility-administered medications for this visit. (Ophthalmic Drugs)   Current Outpatient Medications (Other)  Medication Sig   acetaminophen (TYLENOL) 500 MG tablet Take 1,000 mg by mouth every 8 (eight) hours as needed for moderate pain.   allopurinol (ZYLOPRIM) 300 MG tablet Take 300 mg by mouth daily.    ibuprofen (ADVIL) 200 MG tablet Take 600 mg by mouth every 8 (eight) hours as needed for moderate pain.   Multiple Vitamins-Minerals (MULTIVITAMIN ADULT PO) Take 1 tablet by mouth daily.   potassium chloride (KLOR-CON) 10 MEQ tablet Take 10 mEq by mouth 2 (two) times daily.   torsemide (DEMADEX) 10 MG tablet Take 5 mg by mouth daily as needed (fluid).   triamterene-hydrochlorothiazide (MAXZIDE-25) 37.5-25 MG tablet Take 1 tablet by mouth daily.   ondansetron (ZOFRAN ODT) 4 MG disintegrating tablet Take 1 tablet (4 mg total) by mouth every 8 (eight) hours as needed for nausea or vomiting. (Patient not taking: Reported on 01/06/2022)   No  current facility-administered medications for this visit. (Other)   REVIEW OF SYSTEMS: ROS   Positive for: Musculoskeletal, Eyes Negative for: Constitutional, Gastrointestinal, Neurological, Skin, Genitourinary, HENT, Endocrine, Cardiovascular, Respiratory, Psychiatric, Allergic/Imm, Heme/Lymph Last edited by Laddie Aquas, COA on 10/17/2022  8:31 AM.      ALLERGIES Allergies  Allergen Reactions   Famotidine Other (See Comments)   Tetracycline Hcl Other (See Comments)   Tetracyclines & Related Other (See Comments)    Infection of penis   PAST MEDICAL HISTORY Past Medical History:  Diagnosis Date   Anemia    Arthritis    CAD (coronary artery disease)    2007, inferior ischemia on nuclear scan, catheterization showed  60-70% ostial PDA, 90% distal RCA and a very large right coronary artery ( vision stent was placed 3.5 x 23 mm), 80-90% diagonal ( small vessel, wire could not be passed patient followed medically)   CAD (coronary artery disease)    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; 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 but considers himslef stable , he denies chest pain , headache, sob  , reports he does "heavy exercise " states " i work out with a trainer 2-3 times a week and go walking but i havent done much since the gyms closed "    Cancer of kidney (HCC)    Creatinine elevation  Dieulafoy lesion of stomach    incidence of bleeds in 2015 and 2017 , required blood transfusion as bleed lef to a 4.5 hemoglobin   Gout    H/O right nephrectomy    History of blood transfusion    Hypertension    Skin cancer    skin , squamous cell    Tuberculosis 1962   Varicose veins of legs    bialtera; "ive had them about 5 years now and theyve never been a problem"    Past Surgical History:  Procedure Laterality Date   CATARACT EXTRACTION, BILATERAL  2017   COLONOSCOPY     CORONARY ANGIOPLASTY WITH STENT PLACEMENT  2006   ESOPHAGOGASTRODUODENOSCOPY  04/20/2012    Procedure: ESOPHAGOGASTRODUODENOSCOPY (EGD);  Surgeon: Graylin Shiver, MD;  Location: [facility] ENDOSCOPY;  Service: Endoscopy;  Laterality: N/A;   NEPHRECTOMY Right    REPORTS AVG CREATININE 1.4 FOR THE LAST 20 YEARS    REVERSE SHOULDER ARTHROPLASTY Right 02/14/2020   Procedure: REVERSE SHOULDER ARTHROPLASTY;  Surgeon: Yolonda Kida, MD;  Location: [facility] ORS;  Service: Orthopedics;  Laterality: Right;  2.5 hrs   TOTAL KNEE ARTHROPLASTY Right 08/26/2018   Procedure: TOTAL KNEE ARTHROPLASTY;  Surgeon: Durene Romans, MD;  Location: [facility] ORS;  Service: Orthopedics;  Laterality: Right;  70 mins   FAMILY HISTORY History reviewed. No pertinent family history.  SOCIAL HISTORY Social History   Tobacco Use   Smoking status: Former    Current packs/day: 0.00    Types: Cigarettes    Quit date: 04/01/2021    Years since quitting: 1.5   Smokeless tobacco: Never   Tobacco comments:    7 cigarettes a day fo the last 10 years   Vaping Use   Vaping status: Never Used  Substance Use Topics   Alcohol use: Yes    Alcohol/week: 7.0 standard drinks of alcohol    Types: 7 Glasses of wine per week    Comment: daily    Drug use: No       OPHTHALMIC EXAM:  Base Eye Exam     Visual Acuity (Snellen - Linear)       Right Left   Dist cc 20/20 20/20    Correction: Glasses         Tonometry (Tonopen, 8:30 AM)       Right Left   Pressure 15 13         Pupils       Dark Light Shape React APD   Right 3 2 Round Brisk None   Left 3 2 Round Brisk None         Visual Fields (Counting fingers)       Left Right    Full Full         Extraocular Movement       Right Left    Full, Ortho Full, Ortho         Neuro/Psych     Oriented x3: Yes   Mood/Affect: Normal         Dilation     Both eyes: 1.0% Mydriacyl, 2.5% Phenylephrine @ 8:29 AM           Slit Lamp and Fundus Exam     Slit Lamp Exam       Right Left   Lids/Lashes Dermatochalasis - upper lid Dermatochalasis -  upper lid   Conjunctiva/Sclera Mild conjunctivochalasis Mild conjunctivochalasis   Cornea 1+ Punctate epithelial erosions 1+ Punctate epithelial erosions   Anterior Chamber deep and  clear deep and clear   Iris Round and dilated Round and dilated   Lens Toric PC IOL in good position with marks at 0300 and 0900 Toric PC IOL in good position with marks at 0300 and 0900   Anterior Vitreous Vitreous syneresis, PVD, vit condensations Vitreous syneresis, Posterior vitreous detachment         Fundus Exam       Right Left   Disc mild Pallor, Sharp rim mild pallor, sharp rim, +hyperemia -- improving, vascular loops   C/D Ratio 0.7 0.6   Macula Flat, good foveal reflex, RPE mottling and clumping, No heme or edema Blunted foveal reflex, confluent IRH superior macula -- vastly improved, now scattered IRH greatest temporally, +CWS -- resolved, central cystic changes -- persistent   Vessels attenuated, Tortuous attenuated, Tortuous, superior HRVO   Periphery Attached, No heme Attached, superior DBH -- improving, pigmented paving stone degeneration inferiorly           Refraction     Wearing Rx       Sphere Cylinder Axis Add   Right -1.00 +0.75 014 +2.75   Left -1.00 +1.50 148 +2.75           IMAGING AND PROCEDURES  Imaging and Procedures for 10/17/2022  OCT, Retina - OU - Both Eyes       Right Eye Quality was good. Central Foveal Thickness: 261. Progression has been stable. Findings include normal foveal contour, no IRF, no SRF, myopic contour, retinal drusen .   Left Eye Quality was good. Central Foveal Thickness: 294. Progression has been stable. Findings include no SRF, abnormal foveal contour, myopic contour, intraretinal hyper-reflective material, intraretinal fluid (Persistent cystic changes temporal fovea, stable improvement in superior IRF/IRHM).   Notes *Images captured and stored on drive  Diagnosis / Impression:  OD: NFP, no IRF/SRF OS: persistent cystic changes  temporal fovea, stable improvement in superior IRF/IRHM  Clinical management:  See below  Abbreviations: NFP - Normal foveal profile. CME - cystoid macular edema. PED - pigment epithelial detachment. IRF - intraretinal fluid. SRF - subretinal fluid. EZ - ellipsoid zone. ERM - epiretinal membrane. ORA - outer retinal atrophy. ORT - outer retinal tubulation. SRHM - subretinal hyper-reflective material. IRHM - intraretinal hyper-reflective material      Intravitreal Injection, Pharmacologic Agent - OS - Left Eye       Time Out 10/17/2022. 9:28 AM. Confirmed correct patient, procedure, site, and patient consented.   Anesthesia Topical anesthesia was used. Anesthetic medications included Lidocaine 2%, Proparacaine 0.5%.   Procedure Preparation included 5% betadine to ocular surface, eyelid speculum. A (32g) needle was used.   Injection: 2 mg aflibercept 2 MG/0.05ML   Route: Intravitreal, Site: Left Eye   NDC: L6038910, Lot: [id number], Expiration date: 11/29/2023, Waste: 0 mL   Post-op Post injection exam found visual acuity of at least counting fingers. The patient tolerated the procedure well. There were no complications. The patient received written and verbal post procedure care education. Post injection medications were not given.             ASSESSMENT/PLAN:    ICD-10-CM   1. Branch retinal vein occlusion of left eye with macular edema  H34.8320 OCT, Retina - OU - Both Eyes    Intravitreal Injection, Pharmacologic Agent - OS - Left Eye    aflibercept (EYLEA) SOLN 2 mg    2. Essential hypertension  I10     3. Hypertensive retinopathy of both eyes  H35.033  4. Pseudophakia of both eyes  Z96.1      Superior HRVO w/ CME OS - s/p IVA OS #1 (10.09.23), #2 (11.06.23), #3 (12.04.23), #4 (01.02.24), #5 (01.30.24) -- IVA resistance - s/p IVE OS #1 (02.28.24), #2 (03.27.24), #3 (04.24.24), #4 (05.22.24), #5 (06.20.24) - BCVA OS 20/20 - exam shows continued improvement  in diffuse retinal hemorrhages -- almost resolved - OCT shows persistent cystic changes temporal fovea, stable improvement in superior IRF/IRHM at 4 wks **discussed decreased efficacy / resistance to Eylea and potential benefit of switching medication**  - recommend IVE OS #6 today, 07.10.24 w/ f/u in 4 wks, but will check V[employer] auth - RBA of procedure discussed, questions answered - Eylea informed consent obtained and signed OS 02.28.24 - see procedure note - Eylea prior auth obtained - F/U 4 weeks -- DFE/OCT/possible injection  2,3. Hypertensive retinopathy OU - discussed importance of tight BP control - continue to monitor  4. Pseudophakia OU  - s/p CE/toric IOL OU  - IOLs in good position, doing well  - continue to monitor  Ophthalmic Meds Ordered this visit:  Meds ordered this encounter  Medications   aflibercept (EYLEA) SOLN 2 mg     Return in about 4 weeks (around 11/14/2022) for f/u HRVO OS, DFE, OCT.  There are no Patient Instructions on file for this visit. This document serves as a record of services personally performed by Karie Chimera, MD, PhD. It was created on their behalf by Berlin Hun COT, an ophthalmic technician. The creation of this record is the provider's dictation and/or activities during the visit.    Electronically signed by: Berlin Hun COT 07.10.2411:53 PM  This document serves as a record of services personally performed by Karie Chimera, MD, PhD. It was created on their behalf by Glee Arvin. Manson Passey, OA an ophthalmic technician. The creation of this record is the provider's dictation and/or activities during the visit.    Electronically signed by: Glee Arvin. Manson Passey, OA 10/18/22 11:53 PM  Karie Chimera, M.D., Ph.D. Diseases & Surgery of the Retina and Vitreous [facility] 10/17/2022   I have reviewed the above documentation for accuracy and completeness, and I agree with the above. Karie Chimera, M.D.,  Ph.D. 10/18/22 11:57 PM  Abbreviations: M myopia (nearsighted); A astigmatism; H hyperopia (farsighted); P presbyopia; Mrx spectacle prescription;  CTL contact lenses; OD right eye; OS left eye; OU both eyes  XT exotropia; ET esotropia; PEK punctate epithelial keratitis; PEE punctate epithelial erosions; DES dry eye syndrome; MGD meibomian gland dysfunction; ATs artificial tears; PFAT's preservative free artificial tears; NSC nuclear sclerotic cataract; PSC posterior subcapsular cataract; ERM epi-retinal membrane; PVD posterior vitreous detachment; RD retinal detachment; DM diabetes mellitus; DR diabetic retinopathy; NPDR non-proliferative diabetic retinopathy; PDR proliferative diabetic retinopathy; CSME clinically significant macular edema; DME diabetic macular edema; dbh dot blot hemorrhages; CWS cotton wool spot; POAG primary open angle glaucoma; C/D cup-to-disc ratio; HVF humphrey visual field; GVF goldmann visual field; OCT optical coherence tomography; IOP intraocular pressure; BRVO Branch retinal vein occlusion; CRVO central retinal vein occlusion; CRAO central retinal artery occlusion; BRAO branch retinal artery occlusion; RT retinal tear; SB scleral buckle; PPV pars plana vitrectomy; VH Vitreous hemorrhage; PRP panretinal laser photocoagulation; IVK intravitreal kenalog; VMT vitreomacular traction; MH Macular hole;  NVD neovascularization of the disc; NVE neovascularization elsewhere; AREDS age related eye disease study; ARMD age related macular degeneration; POAG primary open angle glaucoma; EBMD epithelial/anterior basement membrane dystrophy; ACIOL anterior chamber intraocular  lens; IOL intraocular lens; PCIOL posterior chamber intraocular lens; Phaco/IOL phacoemulsification with intraocular lens placement; PRK photorefractive keratectomy; LASIK laser assisted in situ keratomileusis; HTN hypertension; DM diabetes mellitus; COPD chronic obstructive pulmonary disease

## 2022-10-16 ENCOUNTER — Encounter (INDEPENDENT_AMBULATORY_CARE_PROVIDER_SITE_OTHER): Payer: Medicare Other | Admitting: Ophthalmology

## 2022-10-17 ENCOUNTER — Encounter (INDEPENDENT_AMBULATORY_CARE_PROVIDER_SITE_OTHER): Payer: Self-pay | Admitting: Ophthalmology

## 2022-10-17 ENCOUNTER — Ambulatory Visit (INDEPENDENT_AMBULATORY_CARE_PROVIDER_SITE_OTHER): Payer: Medicare Other | Admitting: Ophthalmology

## 2022-10-17 DIAGNOSIS — Z961 Presence of intraocular lens: Secondary | ICD-10-CM

## 2022-10-17 DIAGNOSIS — H34832 Tributary (branch) retinal vein occlusion, left eye, with macular edema: Secondary | ICD-10-CM

## 2022-10-17 DIAGNOSIS — I10 Essential (primary) hypertension: Secondary | ICD-10-CM | POA: Diagnosis not present

## 2022-10-17 DIAGNOSIS — H35033 Hypertensive retinopathy, bilateral: Secondary | ICD-10-CM | POA: Diagnosis not present

## 2022-10-17 MED ORDER — AFLIBERCEPT 2MG/0.05ML IZ SOLN FOR KALEIDOSCOPE
2.0000 mg | INTRAVITREAL | Status: AC | PRN
  Administered 2022-10-17: 2 mg via INTRAVITREAL

## 2022-10-27 ENCOUNTER — Other Ambulatory Visit: Payer: Self-pay | Admitting: Podiatry

## 2022-10-27 ENCOUNTER — Telehealth: Payer: Self-pay | Admitting: Podiatry

## 2022-10-27 MED ORDER — CEPHALEXIN 500 MG PO CAPS: 500.0000 mg | ORAL_CAPSULE | Freq: Three times a day (TID) | ORAL | 1 refills | Status: AC

## 2022-10-27 NOTE — Telephone Encounter (Signed)
Pt called in with discomfort with 2 toe L foot requesting a RX of Cephalexin 500 mg   Please advise

## 2022-10-27 NOTE — Telephone Encounter (Signed)
Sent in prescription for him

## 2022-11-14 ENCOUNTER — Encounter (INDEPENDENT_AMBULATORY_CARE_PROVIDER_SITE_OTHER): Payer: Medicare Other | Admitting: Ophthalmology

## 2022-11-14 NOTE — Progress Notes (Signed)
Subjective:   Patient ID: Glenn John., male   DOB: 89 y.o.   MRN: [id number]   HPI pain   ROS      Objective:  Physical Exam  lesion     Assessment:  pain     Plan:  treated

## 2022-11-17 NOTE — Progress Notes (Signed)
[facility] - Clinic Note  11/19/2022    CHIEF COMPLAINT Patient presents for Retina Follow Up  HISTORY OF PRESENT ILLNESS: Glenn Hunt. is a 89 y.o. male who presents to the clinic today for:   HPI     Retina Follow Up   Patient presents with  Other.  In both eyes.  This started months ago.  Duration of 4 weeks.  I, the attending physician,  performed the HPI with the patient and updated documentation appropriately.        Comments   Patient feels that the vision is the same since his last visit. He is not using eye drops.       Last edited by Rennis Chris, MD on 11/19/2022 12:49 PM.    Pt states vision is stable  Referring physician: Sinda Du, MD [address] [place],  [place] [address]  HISTORICAL INFORMATION:   Selected notes from the MEDICAL RECORD NUMBER Referred by Dr. Cathey Endow for HRVO LEE:  Ocular Hx- PMH-    CURRENT MEDICATIONS: No current outpatient medications on file. (Ophthalmic Drugs)   No current facility-administered medications for this visit. (Ophthalmic Drugs)   Current Outpatient Medications (Other)  Medication Sig   acetaminophen (TYLENOL) 500 MG tablet Take 1,000 mg by mouth every 8 (eight) hours as needed for moderate pain.   allopurinol (ZYLOPRIM) 300 MG tablet Take 300 mg by mouth daily.    cephALEXin (KEFLEX) 500 MG capsule Take 1 capsule (500 mg total) by mouth 3 (three) times daily.   ibuprofen (ADVIL) 200 MG tablet Take 600 mg by mouth every 8 (eight) hours as needed for moderate pain.   Multiple Vitamins-Minerals (MULTIVITAMIN ADULT PO) Take 1 tablet by mouth daily.   ondansetron (ZOFRAN ODT) 4 MG disintegrating tablet Take 1 tablet (4 mg total) by mouth every 8 (eight) hours as needed for nausea or vomiting.   potassium chloride (KLOR-CON) 10 MEQ tablet Take 10 mEq by mouth 2 (two) times daily.   torsemide (DEMADEX) 10 MG tablet Take 5 mg by mouth daily as needed (fluid).    triamterene-hydrochlorothiazide (MAXZIDE-25) 37.5-25 MG tablet Take 1 tablet by mouth daily.   No current facility-administered medications for this visit. (Other)   REVIEW OF SYSTEMS: ROS   Positive for: Musculoskeletal, Eyes Negative for: Constitutional, Gastrointestinal, Neurological, Skin, Genitourinary, HENT, Endocrine, Cardiovascular, Respiratory, Psychiatric, Allergic/Imm, Heme/Lymph Last edited by Charlette Caffey, COT on 11/19/2022  9:17 AM.     ALLERGIES Allergies  Allergen Reactions   Famotidine Other (See Comments)   Tetracycline Hcl Other (See Comments)   Tetracyclines & Related Other (See Comments)    Infection of penis   PAST MEDICAL HISTORY Past Medical History:  Diagnosis Date   Anemia    Arthritis    CAD (coronary artery disease)    2007, inferior ischemia on nuclear scan, catheterization showed  60-70% ostial PDA, 90% distal RCA and a very large right coronary artery ( vision stent was placed 3.5 x 23 mm), 80-90% diagonal ( small vessel, wire could not be passed patient followed medically)   CAD (coronary artery disease)    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; 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 but considers himslef stable , he denies chest pain , headache, sob  , reports he does "heavy exercise " states " i work out with a trainer 2-3 times a week and go walking but i havent done much since the gyms closed "  Cancer of kidney (HCC)    Creatinine elevation    Dieulafoy lesion of stomach    incidence of bleeds in 2015 and 2017 , required blood transfusion as bleed lef to a 4.5 hemoglobin   Gout    H/O right nephrectomy    History of blood transfusion    Hypertension    Skin cancer    skin , squamous cell    Tuberculosis 1962   Varicose veins of legs    bialtera; "ive had them about 5 years now and theyve never been a problem"    Past Surgical History:  Procedure Laterality Date   CATARACT EXTRACTION, BILATERAL  2017   COLONOSCOPY      CORONARY ANGIOPLASTY WITH STENT PLACEMENT  2006   ESOPHAGOGASTRODUODENOSCOPY  04/20/2012   Procedure: ESOPHAGOGASTRODUODENOSCOPY (EGD);  Surgeon: Graylin Shiver, MD;  Location: [facility] ENDOSCOPY;  Service: Endoscopy;  Laterality: N/A;   NEPHRECTOMY Right    REPORTS AVG CREATININE 1.4 FOR THE LAST 20 YEARS    REVERSE SHOULDER ARTHROPLASTY Right 02/14/2020   Procedure: REVERSE SHOULDER ARTHROPLASTY;  Surgeon: Yolonda Kida, MD;  Location: [facility] ORS;  Service: Orthopedics;  Laterality: Right;  2.5 hrs   TOTAL KNEE ARTHROPLASTY Right 08/26/2018   Procedure: TOTAL KNEE ARTHROPLASTY;  Surgeon: Durene Romans, MD;  Location: [facility] ORS;  Service: Orthopedics;  Laterality: Right;  70 mins   FAMILY HISTORY History reviewed. No pertinent family history.  SOCIAL HISTORY Social History   Tobacco Use   Smoking status: Former    Current packs/day: 0.00    Types: Cigarettes    Quit date: 04/01/2021    Years since quitting: 1.6   Smokeless tobacco: Never   Tobacco comments:    7 cigarettes a day fo the last 10 years   Vaping Use   Vaping status: Never Used  Substance Use Topics   Alcohol use: Yes    Alcohol/week: 7.0 standard drinks of alcohol    Types: 7 Glasses of wine per week    Comment: daily    Drug use: No       OPHTHALMIC EXAM:  Base Eye Exam     Visual Acuity (Snellen - Linear)       Right Left   Dist cc 20/20 20/20    Correction: Glasses         Tonometry (Tonopen, 9:20 AM)       Right Left   Pressure 15 14         Pupils       Dark Light Shape React APD   Right 3 2 Round Brisk None   Left 3 2 Round Brisk None         Visual Fields       Left Right    Full Full         Extraocular Movement       Right Left    Full, Ortho Full, Ortho         Neuro/Psych     Oriented x3: Yes   Mood/Affect: Normal         Dilation     Both eyes: 2.5% Phenylephrine, 1.0% Mydriacyl @ 9:18 AM           Slit Lamp and Fundus Exam     Slit Lamp Exam        Right Left   Lids/Lashes Dermatochalasis - upper lid Dermatochalasis - upper lid   Conjunctiva/Sclera Mild conjunctivochalasis Mild conjunctivochalasis   Cornea 1+ Punctate epithelial erosions  1+ Punctate epithelial erosions   Anterior Chamber deep and clear deep and clear   Iris Round and dilated Round and dilated   Lens Toric PC IOL in good position with marks at 0300 and 0900 Toric PC IOL in good position with marks at 0300 and 0900   Anterior Vitreous Vitreous syneresis, PVD, vit condensations Vitreous syneresis, Posterior vitreous detachment         Fundus Exam       Right Left   Disc mild Pallor, Sharp rim mild pallor, sharp rim, +hyperemia -- improved, vascular loops   C/D Ratio 0.7 0.6   Macula Flat, good foveal reflex, RPE mottling and clumping, No heme or edema Blunted foveal reflex, confluent IRH superior macula -- vastly improved, now scattered IRH greatest temporally, central cystic changes -- persistent   Vessels attenuated, Tortuous attenuated, Tortuous, superior HRVO   Periphery Attached, No heme Attached, superior DBH -- improved, pigmented paving stone degeneration inferiorly           Refraction     Wearing Rx       Sphere Cylinder Axis Add   Right -1.00 +0.75 014 +2.75   Left -1.00 +1.50 148 +2.75           IMAGING AND PROCEDURES  Imaging and Procedures for 11/19/2022  OCT, Retina - OU - Both Eyes       Right Eye Quality was good. Central Foveal Thickness: 262. Progression has been stable. Findings include normal foveal contour, no IRF, no SRF, myopic contour, retinal drusen .   Left Eye Quality was good. Central Foveal Thickness: 288. Progression has been stable. Findings include no SRF, abnormal foveal contour, myopic contour, intraretinal hyper-reflective material, intraretinal fluid (Persistent cystic changes temporal fovea -- slightly improved, stable improvement in superior IRF/IRHM).   Notes *Images captured and stored on  drive  Diagnosis / Impression:  OD: NFP, no IRF/SRF OS: Persistent cystic changes temporal fovea -- slightly improved, stable improvement in superior IRF/IRHM  Clinical management:  See below  Abbreviations: NFP - Normal foveal profile. CME - cystoid macular edema. PED - pigment epithelial detachment. IRF - intraretinal fluid. SRF - subretinal fluid. EZ - ellipsoid zone. ERM - epiretinal membrane. ORA - outer retinal atrophy. ORT - outer retinal tubulation. SRHM - subretinal hyper-reflective material. IRHM - intraretinal hyper-reflective material      Intravitreal Injection, Pharmacologic Agent - OS - Left Eye       Time Out 11/19/2022. 10:23 AM. Confirmed correct patient, procedure, site, and patient consented.   Anesthesia Topical anesthesia was used. Anesthetic medications included Lidocaine 2%, Proparacaine 0.5%.   Procedure Preparation included 5% betadine to ocular surface, eyelid speculum. A (32g) needle was used.   Injection: 6 mg faricimab-svoa 6 MG/0.05ML   Route: Intravitreal, Site: Left Eye   NDC: O8010301, Lot: [id number], Expiration date: 11/28/2024, Waste: 0 mL   Post-op Post injection exam found visual acuity of at least counting fingers. The patient tolerated the procedure well. There were no complications. The patient received written and verbal post procedure care education. Post injection medications were not given.              ASSESSMENT/PLAN:    ICD-10-CM   1. Branch retinal vein occlusion of left eye with macular edema  H34.8320 OCT, Retina - OU - Both Eyes    Intravitreal Injection, Pharmacologic Agent - OS - Left Eye    faricimab-svoa (VABYSMO) 6mg /0.74mL intravitreal injection    2. Essential hypertension  I10  3. Hypertensive retinopathy of both eyes  H35.033     4. Pseudophakia of both eyes  Z96.1      Superior HRVO w/ CME OS - s/p IVA OS #1 (10.09.23), #2 (11.06.23), #3 (12.04.23), #4 (01.02.24), #5 (01.30.24) -- IVA  resistance - s/p IVE OS #1 (02.28.24), #2 (03.27.24), #3 (04.24.24), #4 (05.22.24), #5 (06.20.24), #6 (07.19.24) - BCVA OS 20/20 - exam shows continued improvement in diffuse retinal hemorrhages -- almost resolved - OCT shows persistent cystic changes temporal fovea -- slightly improved, stable improvement in superior IRF/IRHM **discussed decreased efficacy / resistance to Eylea and potential benefit of switching medication**  - recommend switching to IVV OS #1 today, 08.21.24 w/ f/u in 4 wks - RBA of procedure discussed, questions answered - Vabysmo informed consent obtained and signed OS 08.21.24 - see procedure note - Eylea prior auth obtained - Vabysmo approved for 2024 - F/U 4 weeks -- DFE/OCT/possible injection  2,3. Hypertensive retinopathy OU - discussed importance of tight BP control - continue to monitor  4. Pseudophakia OU  - s/p CE/toric IOL OU  - IOLs in good position, doing well  - continue to monitor  Ophthalmic Meds Ordered this visit:  Meds ordered this encounter  Medications   faricimab-svoa (VABYSMO) 6mg /0.9mL intravitreal injection     Return in about 4 weeks (around 12/17/2022) for f/u BRVO OS, DFE, OCT.  There are no Patient Instructions on file for this visit.  This document serves as a record of services personally performed by Karie Chimera, MD, PhD. It was created on their behalf by De Blanch, an ophthalmic technician. The creation of this record is the provider's dictation and/or activities during the visit.    Electronically signed by: De Blanch, OA, 11/20/22  11:48 AM  This document serves as a record of services personally performed by Karie Chimera, MD, PhD. It was created on their behalf by Glee Arvin. Manson Passey, OA an ophthalmic technician. The creation of this record is the provider's dictation and/or activities during the visit.    Electronically signed by: Glee Arvin. Manson Passey, OA 11/20/22 11:48 AM  Karie Chimera, M.D.,  Ph.D. Diseases & Surgery of the Retina and Vitreous [facility] 11/20/2022   I have reviewed the above documentation for accuracy and completeness, and I agree with the above. Karie Chimera, M.D., Ph.D. 11/20/22 11:50 AM  Abbreviations: M myopia (nearsighted); A astigmatism; H hyperopia (farsighted); P presbyopia; Mrx spectacle prescription;  CTL contact lenses; OD right eye; OS left eye; OU both eyes  XT exotropia; ET esotropia; PEK punctate epithelial keratitis; PEE punctate epithelial erosions; DES dry eye syndrome; MGD meibomian gland dysfunction; ATs artificial tears; PFAT's preservative free artificial tears; NSC nuclear sclerotic cataract; PSC posterior subcapsular cataract; ERM epi-retinal membrane; PVD posterior vitreous detachment; RD retinal detachment; DM diabetes mellitus; DR diabetic retinopathy; NPDR non-proliferative diabetic retinopathy; PDR proliferative diabetic retinopathy; CSME clinically significant macular edema; DME diabetic macular edema; dbh dot blot hemorrhages; CWS cotton wool spot; POAG primary open angle glaucoma; C/D cup-to-disc ratio; HVF humphrey visual field; GVF goldmann visual field; OCT optical coherence tomography; IOP intraocular pressure; BRVO Branch retinal vein occlusion; CRVO central retinal vein occlusion; CRAO central retinal artery occlusion; BRAO branch retinal artery occlusion; RT retinal tear; SB scleral buckle; PPV pars plana vitrectomy; VH Vitreous hemorrhage; PRP panretinal laser photocoagulation; IVK intravitreal kenalog; VMT vitreomacular traction; MH Macular hole;  NVD neovascularization of the disc; NVE neovascularization elsewhere; AREDS age related eye disease study; ARMD  age related macular degeneration; POAG primary open angle glaucoma; EBMD epithelial/anterior basement membrane dystrophy; ACIOL anterior chamber intraocular lens; IOL intraocular lens; PCIOL posterior chamber intraocular lens; Phaco/IOL phacoemulsification  with intraocular lens placement; PRK photorefractive keratectomy; LASIK laser assisted in situ keratomileusis; HTN hypertension; DM diabetes mellitus; COPD chronic obstructive pulmonary disease

## 2022-11-19 ENCOUNTER — Encounter (INDEPENDENT_AMBULATORY_CARE_PROVIDER_SITE_OTHER): Payer: Self-pay | Admitting: Ophthalmology

## 2022-11-19 ENCOUNTER — Ambulatory Visit (INDEPENDENT_AMBULATORY_CARE_PROVIDER_SITE_OTHER): Payer: Medicare Other | Admitting: Ophthalmology

## 2022-11-19 DIAGNOSIS — Z961 Presence of intraocular lens: Secondary | ICD-10-CM | POA: Diagnosis not present

## 2022-11-19 DIAGNOSIS — H35033 Hypertensive retinopathy, bilateral: Secondary | ICD-10-CM

## 2022-11-19 DIAGNOSIS — I10 Essential (primary) hypertension: Secondary | ICD-10-CM

## 2022-11-19 DIAGNOSIS — H34832 Tributary (branch) retinal vein occlusion, left eye, with macular edema: Secondary | ICD-10-CM | POA: Diagnosis not present

## 2022-11-19 MED ORDER — FARICIMAB-SVOA 6 MG/0.05ML IZ SOLN
6.0000 mg | INTRAVITREAL | Status: AC | PRN
  Administered 2022-11-19: 6 mg via INTRAVITREAL

## 2022-12-12 ENCOUNTER — Ambulatory Visit (HOSPITAL_BASED_OUTPATIENT_CLINIC_OR_DEPARTMENT_OTHER)
Admission: RE | Admit: 2022-12-12 | Discharge: 2022-12-12 | Disposition: A | Discharge status: Home / Self Care | Payer: Medicare Other | Source: Ambulatory Visit | Attending: Internal Medicine | Admitting: Internal Medicine

## 2022-12-12 ENCOUNTER — Other Ambulatory Visit (HOSPITAL_BASED_OUTPATIENT_CLINIC_OR_DEPARTMENT_OTHER): Payer: Self-pay | Admitting: Internal Medicine

## 2022-12-12 DIAGNOSIS — N179 Acute kidney failure, unspecified: Secondary | ICD-10-CM | POA: Diagnosis present

## 2022-12-16 NOTE — Progress Notes (Signed)
[facility] - Clinic Note  12/17/2022    CHIEF COMPLAINT Patient presents for Retina Follow Up  HISTORY OF PRESENT ILLNESS: Glenn Hunt. is a 89 y.o. male who presents to the clinic today for:   HPI     Retina Follow Up   Patient presents with  CRVO/BRVO.  In left eye.  This started 4 weeks ago.  Duration of 4 weeks.  Since onset it is stable.  I, the attending physician,  performed the HPI with the patient and updated documentation appropriately.        Comments   4 week retina follow up BRVO OS and IVV OS pt is reporting no vision changes noticed he denies any flashes but has some floaters  pt states his eye feel little dryness using AT's       Last edited by Rennis Chris, MD on 12/17/2022 11:55 AM.    Pt states his eyes feel irritated in his sleep, he did not have any problems with the first Vabysmo injection at last visit  Referring physician: Sinda Du, MD [address] [place],  [place] [address]  HISTORICAL INFORMATION:   Selected notes from the MEDICAL RECORD NUMBER Referred by Dr. Cathey Endow for HRVO LEE:  Ocular Hx- PMH-    CURRENT MEDICATIONS: No current outpatient medications on file. (Ophthalmic Drugs)   No current facility-administered medications for this visit. (Ophthalmic Drugs)   Current Outpatient Medications (Other)  Medication Sig   acetaminophen (TYLENOL) 500 MG tablet Take 1,000 mg by mouth every 8 (eight) hours as needed for moderate pain.   allopurinol (ZYLOPRIM) 300 MG tablet Take 300 mg by mouth daily.    cephALEXin (KEFLEX) 500 MG capsule Take 1 capsule (500 mg total) by mouth 3 (three) times daily.   ibuprofen (ADVIL) 200 MG tablet Take 600 mg by mouth every 8 (eight) hours as needed for moderate pain.   Multiple Vitamins-Minerals (MULTIVITAMIN ADULT PO) Take 1 tablet by mouth daily.   ondansetron (ZOFRAN ODT) 4 MG disintegrating tablet Take 1 tablet (4 mg total) by mouth every 8 (eight) hours as needed for  nausea or vomiting.   potassium chloride (KLOR-CON) 10 MEQ tablet Take 10 mEq by mouth 2 (two) times daily.   torsemide (DEMADEX) 10 MG tablet Take 5 mg by mouth daily as needed (fluid).   triamterene-hydrochlorothiazide (MAXZIDE-25) 37.5-25 MG tablet Take 1 tablet by mouth daily.   No current facility-administered medications for this visit. (Other)   REVIEW OF SYSTEMS: ROS   Positive for: Musculoskeletal, Eyes Negative for: Constitutional, Gastrointestinal, Neurological, Skin, Genitourinary, HENT, Endocrine, Cardiovascular, Respiratory, Psychiatric, Allergic/Imm, Heme/Lymph Last edited by Etheleen Mayhew, COT on 12/17/2022  9:50 AM.      ALLERGIES Allergies  Allergen Reactions   Famotidine Other (See Comments)   Tetracycline Hcl Other (See Comments)   Tetracyclines & Related Other (See Comments)    Infection of penis   PAST MEDICAL HISTORY Past Medical History:  Diagnosis Date   Anemia    Arthritis    CAD (coronary artery disease)    2007, inferior ischemia on nuclear scan, catheterization showed  60-70% ostial PDA, 90% distal RCA and a very large right coronary artery ( vision stent was placed 3.5 x 23 mm), 80-90% diagonal ( small vessel, wire could not be passed patient followed medically)   CAD (coronary artery disease)    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; 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 but  considers himslef stable , he denies chest pain , headache, sob  , reports he does "heavy exercise " states " i work out with a trainer 2-3 times a week and go walking but i havent done much since the gyms closed "    Cancer of kidney (HCC)    Creatinine elevation    Dieulafoy lesion of stomach    incidence of bleeds in 2015 and 2017 , required blood transfusion as bleed lef to a 4.5 hemoglobin   Gout    H/O right nephrectomy    History of blood transfusion    Hypertension    Skin cancer    skin , squamous cell    Tuberculosis 1962   Varicose veins  of legs    bialtera; "ive had them about 5 years now and theyve never been a problem"    Past Surgical History:  Procedure Laterality Date   CATARACT EXTRACTION, BILATERAL  2017   COLONOSCOPY     CORONARY ANGIOPLASTY WITH STENT PLACEMENT  2006   ESOPHAGOGASTRODUODENOSCOPY  04/20/2012   Procedure: ESOPHAGOGASTRODUODENOSCOPY (EGD);  Surgeon: Graylin Shiver, MD;  Location: [facility] ENDOSCOPY;  Service: Endoscopy;  Laterality: N/A;   NEPHRECTOMY Right    REPORTS AVG CREATININE 1.4 FOR THE LAST 20 YEARS    REVERSE SHOULDER ARTHROPLASTY Right 02/14/2020   Procedure: REVERSE SHOULDER ARTHROPLASTY;  Surgeon: Yolonda Kida, MD;  Location: [facility] ORS;  Service: Orthopedics;  Laterality: Right;  2.5 hrs   TOTAL KNEE ARTHROPLASTY Right 08/26/2018   Procedure: TOTAL KNEE ARTHROPLASTY;  Surgeon: Durene Romans, MD;  Location: [facility] ORS;  Service: Orthopedics;  Laterality: Right;  70 mins   FAMILY HISTORY History reviewed. No pertinent family history.  SOCIAL HISTORY Social History   Tobacco Use   Smoking status: Former    Current packs/day: 0.00    Types: Cigarettes    Quit date: 04/01/2021    Years since quitting: 1.7   Smokeless tobacco: Never   Tobacco comments:    7 cigarettes a day fo the last 10 years   Vaping Use   Vaping status: Never Used  Substance Use Topics   Alcohol use: Yes    Alcohol/week: 7.0 standard drinks of alcohol    Types: 7 Glasses of wine per week    Comment: daily    Drug use: No       OPHTHALMIC EXAM:  Base Eye Exam     Visual Acuity (Snellen - Linear)       Right Left   Dist cc 20/20 -3 20/25 -2   Dist ph cc  NI         Tonometry (Tonopen, 9:55 AM)       Right Left   Pressure 14 14         Pupils       Pupils Dark Light Shape React APD   Right PERRL 3 2 Round Brisk None   Left PERRL 3 2 Round Brisk None         Visual Fields       Left Right    Full Full         Extraocular Movement       Right Left    Full, Ortho Full, Ortho          Neuro/Psych     Oriented x3: Yes   Mood/Affect: Normal         Dilation     Both eyes: 2.5% Phenylephrine @ 9:56 AM  Slit Lamp and Fundus Exam     Slit Lamp Exam       Right Left   Lids/Lashes Dermatochalasis - upper lid Dermatochalasis - upper lid   Conjunctiva/Sclera Mild conjunctivochalasis Mild conjunctivochalasis   Cornea 1+ Punctate epithelial erosions 1+ Punctate epithelial erosions   Anterior Chamber deep and clear deep and clear   Iris Round and dilated Round and dilated   Lens Toric PC IOL in good position with marks at 0300 and 0900 Toric PC IOL in good position with marks at 0300 and 0900   Anterior Vitreous Vitreous syneresis, PVD, vit condensations Vitreous syneresis, Posterior vitreous detachment         Fundus Exam       Right Left   Disc mild Pallor, Sharp rim mild pallor, sharp rim, +hyperemia -- improved, vascular loops   C/D Ratio 0.7 0.6   Macula Flat, good foveal reflex, RPE mottling and clumping, No heme or edema, punctate CWS along ST arcades Blunted foveal reflex, confluent IRH superior macula -- vastly improved, now rare MA greatest temporally, central cystic changes -- persistent   Vessels attenuated, Tortuous attenuated, Tortuous, superior HRVO   Periphery Attached, No heme Attached, superior DBH -- improved, pigmented paving stone degeneration inferiorly           Refraction     Wearing Rx       Sphere Cylinder Axis Add   Right -1.00 +0.75 014 +2.75   Left -1.00 +1.50 148 +2.75           IMAGING AND PROCEDURES  Imaging and Procedures for 12/17/2022  OCT, Retina - OU - Both Eyes       Right Eye Quality was good. Central Foveal Thickness: 264. Progression has been stable. Findings include normal foveal contour, no IRF, no SRF, myopic contour, retinal drusen .   Left Eye Quality was good. Central Foveal Thickness: 290. Progression has been stable. Findings include no SRF, abnormal foveal contour, myopic  contour, intraretinal hyper-reflective material, intraretinal fluid (Persistent cystic changes temporal fovea, stable improvement in superior IRF/IRHM).   Notes *Images captured and stored on drive  Diagnosis / Impression:  OD: NFP, no IRF/SRF OS: Persistent cystic changes temporal fovea, stable improvement in superior IRF/IRHM  Clinical management:  See below  Abbreviations: NFP - Normal foveal profile. CME - cystoid macular edema. PED - pigment epithelial detachment. IRF - intraretinal fluid. SRF - subretinal fluid. EZ - ellipsoid zone. ERM - epiretinal membrane. ORA - outer retinal atrophy. ORT - outer retinal tubulation. SRHM - subretinal hyper-reflective material. IRHM - intraretinal hyper-reflective material      Intravitreal Injection, Pharmacologic Agent - OS - Left Eye       Time Out 12/17/2022. 11:12 AM. Confirmed correct patient, procedure, site, and patient consented.   Anesthesia Topical anesthesia was used. Anesthetic medications included Lidocaine 2%, Proparacaine 0.5%.   Procedure Preparation included 5% betadine to ocular surface, eyelid speculum. A supplied (32g) needle was used.   Injection: 6 mg faricimab-svoa 6 MG/0.05ML   Route: Intravitreal, Site: Left Eye   NDC: O8010301, Lot: [id number], Expiration date: 12/29/2023, Waste: 0 mL   Post-op Post injection exam found visual acuity of at least counting fingers. The patient tolerated the procedure well. There were no complications. The patient received written and verbal post procedure care education. Post injection medications were not given.   Notes **Prefilled syringe administered** NDC# 85462-703-50           ASSESSMENT/PLAN:    ICD-10-CM  1. Branch retinal vein occlusion of left eye with macular edema  H34.8320 OCT, Retina - OU - Both Eyes    Intravitreal Injection, Pharmacologic Agent - OS - Left Eye    faricimab-svoa (VABYSMO) 6mg /0.68mL intravitreal injection    2. Essential hypertension   I10     3. Hypertensive retinopathy of both eyes  H35.033     4. Pseudophakia of both eyes  Z96.1      Superior HRVO w/ CME OS - s/p IVA OS #1 (10.09.23), #2 (11.06.23), #3 (12.04.23), #4 (01.02.24), #5 (01.30.24) -- IVA resistance - s/p IVE OS #1 (02.28.24), #2 (03.27.24), #3 (04.24.24), #4 (05.22.24), #5 (06.20.24), #6 (07.19.24) -- IVE resistance - s/p IVV OS #1 (08.21.24) - BCVA OS 20/25  - exam shows continued improvement in diffuse retinal hemorrhages -- almost resolved - OCT shows persistent cystic changes temporal fovea, stable improvement in superior IRF/IRHM - recommend IVV OS #2 today, 09.18.24 w/ f/u in 4 wks - RBA of procedure discussed, questions answered - Vabysmo informed consent obtained and signed OS 08.21.24 - see procedure note - E[employer] prior auth obtained - Vabysmo approved for 2024 - F/U 4 weeks -- DFE/OCT/possible injection  2,3. Hypertensive retinopathy OU - discussed importance of tight BP control - continue to monitor  4. Pseudophakia OU  - s/p CE/toric IOL OU  - IOLs in good position, doing well  - continue to monitor  Ophthalmic Meds Ordered this visit:  Meds ordered this encounter  Medications   faricimab-svoa (VABYSMO) 6mg /0.85mL intravitreal injection     Return in about 4 weeks (around 01/14/2023) for f/u BRVO OS, DFE, OCT.  There are no Patient Instructions on file for this visit.  This document serves as a record of services personally performed by Karie Chimera, MD, PhD. It was created on their behalf by De Blanch, an ophthalmic technician. The creation of this record is the provider's dictation and/or activities during the visit.    Electronically signed by: De Blanch, OA, 12/17/22  12:01 PM  This document serves as a record of services personally performed by Karie Chimera, MD, PhD. It was created on their behalf by Glee Arvin. Manson Passey, OA an ophthalmic technician. The creation of this record is the provider's dictation  and/or activities during the visit.    Electronically signed by: Glee Arvin. Manson Passey, OA 12/17/22 12:01 PM  Karie Chimera, M.D., Ph.D. Diseases & Surgery of the Retina and Vitreous [facility] 12/17/2022   I have reviewed the above documentation for accuracy and completeness, and I agree with the above. Karie Chimera, M.D., Ph.D. 12/17/22 12:07 PM   Abbreviations: M myopia (nearsighted); A astigmatism; H hyperopia (farsighted); P presbyopia; Mrx spectacle prescription;  CTL contact lenses; OD right eye; OS left eye; OU both eyes  XT exotropia; ET esotropia; PEK punctate epithelial keratitis; PEE punctate epithelial erosions; DES dry eye syndrome; MGD meibomian gland dysfunction; ATs artificial tears; PFAT's preservative free artificial tears; NSC nuclear sclerotic cataract; PSC posterior subcapsular cataract; ERM epi-retinal membrane; PVD posterior vitreous detachment; RD retinal detachment; DM diabetes mellitus; DR diabetic retinopathy; NPDR non-proliferative diabetic retinopathy; PDR proliferative diabetic retinopathy; CSME clinically significant macular edema; DME diabetic macular edema; dbh dot blot hemorrhages; CWS cotton wool spot; POAG primary open angle glaucoma; C/D cup-to-disc ratio; HVF humphrey visual field; GVF goldmann visual field; OCT optical coherence tomography; IOP intraocular pressure; BRVO Branch retinal vein occlusion; CRVO central retinal vein occlusion; CRAO central retinal artery occlusion; BRAO branch retinal  artery occlusion; RT retinal tear; SB scleral buckle; PPV pars plana vitrectomy; VH Vitreous hemorrhage; PRP panretinal laser photocoagulation; IVK intravitreal kenalog; VMT vitreomacular traction; MH Macular hole;  NVD neovascularization of the disc; NVE neovascularization elsewhere; AREDS age related eye disease study; ARMD age related macular degeneration; POAG primary open angle glaucoma; EBMD epithelial/anterior basement membrane dystrophy;  ACIOL anterior chamber intraocular lens; IOL intraocular lens; PCIOL posterior chamber intraocular lens; Phaco/IOL phacoemulsification with intraocular lens placement; PRK photorefractive keratectomy; LASIK laser assisted in situ keratomileusis; HTN hypertension; DM diabetes mellitus; COPD chronic obstructive pulmonary disease

## 2022-12-17 ENCOUNTER — Ambulatory Visit (INDEPENDENT_AMBULATORY_CARE_PROVIDER_SITE_OTHER): Payer: Medicare Other | Admitting: Ophthalmology

## 2022-12-17 ENCOUNTER — Encounter (INDEPENDENT_AMBULATORY_CARE_PROVIDER_SITE_OTHER): Payer: Self-pay | Admitting: Ophthalmology

## 2022-12-17 DIAGNOSIS — H35033 Hypertensive retinopathy, bilateral: Secondary | ICD-10-CM

## 2022-12-17 DIAGNOSIS — Z961 Presence of intraocular lens: Secondary | ICD-10-CM | POA: Diagnosis not present

## 2022-12-17 DIAGNOSIS — I10 Essential (primary) hypertension: Secondary | ICD-10-CM

## 2022-12-17 DIAGNOSIS — H34832 Tributary (branch) retinal vein occlusion, left eye, with macular edema: Secondary | ICD-10-CM | POA: Diagnosis not present

## 2022-12-17 MED ORDER — FARICIMAB-SVOA 6 MG/0.05ML IZ SOLN
6.0000 mg | INTRAVITREAL | Status: AC | PRN
  Administered 2022-12-17: 6 mg via INTRAVITREAL

## 2023-01-14 ENCOUNTER — Encounter (INDEPENDENT_AMBULATORY_CARE_PROVIDER_SITE_OTHER): Payer: Medicare Other | Admitting: Ophthalmology

## 2023-01-15 NOTE — Progress Notes (Signed)
[facility] - Clinic Note  01/19/2023    CHIEF COMPLAINT Patient presents for Retina Follow Up  HISTORY OF PRESENT ILLNESS: Glenn Hunt. is a 89 y.o. male who presents to the clinic today for:   HPI     Retina Follow Up   Patient presents with  CRVO/BRVO.  In left eye.  This started 4 weeks ago.  Duration of 4 weeks.  Since onset it is stable.  I, the attending physician,  performed the HPI with the patient and updated documentation appropriately.        Comments   4 week retina follow up BRVO OS pt is reporting no vision changes noticed he denies any flashes just a few floaters at times       Last edited by Rennis Chris, MD on 01/19/2023  4:26 PM.    Pt states   Referring physician: Sinda Du, MD [address] [place],  [place] [address]  HISTORICAL INFORMATION:   Selected notes from the MEDICAL RECORD NUMBER Referred by Dr. Cathey Endow for HRVO LEE:  Ocular Hx- PMH-    CURRENT MEDICATIONS: No current outpatient medications on file. (Ophthalmic Drugs)   No current facility-administered medications for this visit. (Ophthalmic Drugs)   Current Outpatient Medications (Other)  Medication Sig   acetaminophen (TYLENOL) 500 MG tablet Take 1,000 mg by mouth every 8 (eight) hours as needed for moderate pain.   allopurinol (ZYLOPRIM) 300 MG tablet Take 300 mg by mouth daily.    cephALEXin (KEFLEX) 500 MG capsule Take 1 capsule (500 mg total) by mouth 3 (three) times daily.   ibuprofen (ADVIL) 200 MG tablet Take 600 mg by mouth every 8 (eight) hours as needed for moderate pain.   Multiple Vitamins-Minerals (MULTIVITAMIN ADULT PO) Take 1 tablet by mouth daily.   ondansetron (ZOFRAN ODT) 4 MG disintegrating tablet Take 1 tablet (4 mg total) by mouth every 8 (eight) hours as needed for nausea or vomiting.   potassium chloride (KLOR-CON) 10 MEQ tablet Take 10 mEq by mouth 2 (two) times daily.   torsemide (DEMADEX) 10 MG tablet Take 5 mg by mouth daily  as needed (fluid).   triamterene-hydrochlorothiazide (MAXZIDE-25) 37.5-25 MG tablet Take 1 tablet by mouth daily.   No current facility-administered medications for this visit. (Other)   REVIEW OF SYSTEMS: ROS   Positive for: Musculoskeletal, Eyes Negative for: Constitutional, Gastrointestinal, Neurological, Skin, Genitourinary, HENT, Endocrine, Cardiovascular, Respiratory, Psychiatric, Allergic/Imm, Heme/Lymph Last edited by Etheleen Mayhew, COT on 01/19/2023  2:24 PM.       ALLERGIES Allergies  Allergen Reactions   Famotidine Other (See Comments)   Tetracycline Hcl Other (See Comments)   Tetracyclines & Related Other (See Comments)    Infection of penis   PAST MEDICAL HISTORY Past Medical History:  Diagnosis Date   Anemia    Arthritis    CAD (coronary artery disease)    2007, inferior ischemia on nuclear scan, catheterization showed  60-70% ostial PDA, 90% distal RCA and a very large right coronary artery ( vision stent was placed 3.5 x 23 mm), 80-90% diagonal ( small vessel, wire could not be passed patient followed medically)   CAD (coronary artery disease)    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; 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 but considers himslef stable , he denies chest pain , headache, sob  , reports he does "heavy exercise " states " i work out with a trainer 2-3 times  a week and go walking but i havent done much since the gyms closed "    Cancer of kidney (HCC)    Creatinine elevation    Dieulafoy lesion of stomach    incidence of bleeds in 2015 and 2017 , required blood transfusion as bleed lef to a 4.5 hemoglobin   Gout    H/O right nephrectomy    History of blood transfusion    Hypertension    Skin cancer    skin , squamous cell    Tuberculosis 1962   Varicose veins of legs    bialtera; "ive had them about 5 years now and theyve never been a problem"    Past Surgical History:  Procedure Laterality Date   CATARACT EXTRACTION,  BILATERAL  2017   COLONOSCOPY     CORONARY ANGIOPLASTY WITH STENT PLACEMENT  2006   ESOPHAGOGASTRODUODENOSCOPY  04/20/2012   Procedure: ESOPHAGOGASTRODUODENOSCOPY (EGD);  Surgeon: Graylin Shiver, MD;  [facility] ENDOSCOPY;  Service: Endoscopy;  Laterality: N/A;   NEPHRECTOMY Right    REPORTS AVG CREATININE 1.4 FOR THE LAST 20 YEARS    REVERSE SHOULDER ARTHROPLASTY Right 02/14/2020   Procedure: REVERSE SHOULDER ARTHROPLASTY;  Surgeon: Yolonda Kida, MD;  Location: [facility] ORS;  Service: Orthopedics;  Laterality: Right;  2.5 hrs   TOTAL KNEE ARTHROPLASTY Right 08/26/2018   Procedure: TOTAL KNEE ARTHROPLASTY;  Surgeon: Durene Romans, MD;  Location: [facility] ORS;  Service: Orthopedics;  Laterality: Right;  70 mins   FAMILY HISTORY History reviewed. No pertinent family history.  SOCIAL HISTORY Social History   Tobacco Use   Smoking status: Former    Current packs/day: 0.00    Types: Cigarettes    Quit date: 04/01/2021    Years since quitting: 1.8   Smokeless tobacco: Never   Tobacco comments:    7 cigarettes a day fo the last 10 years   Vaping Use   Vaping status: Never Used  Substance Use Topics   Alcohol use: Yes    Alcohol/week: 7.0 standard drinks of alcohol    Types: 7 Glasses of wine per week    Comment: daily    Drug use: No       OPHTHALMIC EXAM:  Base Eye Exam     Visual Acuity (Snellen - Linear)       Right Left   Dist cc 20/20 -1 20/25 -1   Dist ph cc  NI         Tonometry (Tonopen, 2:28 PM)       Right Left   Pressure 14 16         Pupils       Pupils Dark Light Shape React APD   Right PERRL 3 2 Round Brisk None   Left PERRL 3 2 Round Brisk None         Visual Fields       Left Right    Full Full         Extraocular Movement       Right Left    Full, Ortho Full, Ortho         Neuro/Psych     Oriented x3: Yes   Mood/Affect: Normal         Dilation     Both eyes: 2.5% Phenylephrine @ 2:26 PM           Slit Lamp and  Fundus Exam     Slit Lamp Exam       Right Left  Lids/Lashes Dermatochalasis - upper lid Dermatochalasis - upper lid   Conjunctiva/Sclera Mild conjunctivochalasis Mild conjunctivochalasis   Cornea 1+ Punctate epithelial erosions 1+ Punctate epithelial erosions   Anterior Chamber deep and clear deep and clear   Iris Round and dilated Round and dilated   Lens Toric PC IOL in good position with marks at 0300 and 0900 Toric PC IOL in good position with marks at 0300 and 0900   Anterior Vitreous Vitreous syneresis, PVD, vit condensations Vitreous syneresis, Posterior vitreous detachment         Fundus Exam       Right Left   Disc mild Pallor, Sharp rim mild pallor, sharp rim, +hyperemia -- improved, vascular loops   C/D Ratio 0.7 0.6   Macula Flat, good foveal reflex, RPE mottling and clumping, No heme or edema, punctate CWS along ST arcades -- improved Blunted foveal reflex, confluent IRH superior macula -- vastly improved, now rare MA greatest temporally, central cystic changes -- slightly improved   Vessels attenuated, Tortuous attenuated, Tortuous, superior HRVO   Periphery Attached, No heme Attached, superior DBH -- improved, pigmented paving stone degeneration inferiorly           Refraction     Wearing Rx       Sphere Cylinder Axis Add   Right -1.00 +0.75 014 +2.75   Left -1.00 +1.50 148 +2.75           IMAGING AND PROCEDURES  Imaging and Procedures for 01/19/2023  OCT, Retina - OU - Both Eyes       Right Eye Quality was good. Central Foveal Thickness: 264. Progression has been stable. Findings include normal foveal contour, no IRF, no SRF, myopic contour, retinal drusen .   Left Eye Quality was good. Central Foveal Thickness: 290. Progression has improved. Findings include no SRF, abnormal foveal contour, myopic contour, intraretinal hyper-reflective material, intraretinal fluid (Persistent cystic changes temporal fovea -- slightly improved, stable improvement  in superior IRF/IRHM).   Notes *Images captured and stored on drive  Diagnosis / Impression:  OD: NFP, no IRF/SRF OS: Persistent cystic changes temporal fovea -- slightly improved, stable improvement in superior IRF/IRHM  Clinical management:  See below  Abbreviations: NFP - Normal foveal profile. CME - cystoid macular edema. PED - pigment epithelial detachment. IRF - intraretinal fluid. SRF - subretinal fluid. EZ - ellipsoid zone. ERM - epiretinal membrane. ORA - outer retinal atrophy. ORT - outer retinal tubulation. SRHM - subretinal hyper-reflective material. IRHM - intraretinal hyper-reflective material      Intravitreal Injection, Pharmacologic Agent - OS - Left Eye       Time Out 01/19/2023. 2:59 PM. Confirmed correct patient, procedure, site, and patient consented.   Anesthesia Topical anesthesia was used. Anesthetic medications included Lidocaine 2%, Proparacaine 0.5%.   Procedure Preparation included 5% betadine to ocular surface, eyelid speculum. A (32g) needle was used.   Injection: 6 mg faricimab-svoa 6 MG/0.05ML   Route: Intravitreal, Site: Left Eye   NDC: 78469-629-52, Lot: [id number], Expiration date: 06/28/2024, Waste: 0 mL   Post-op Post injection exam found visual acuity of at least counting fingers. The patient tolerated the procedure well. There were no complications. The patient received written and verbal post procedure care education. Post injection medications were not given.            ASSESSMENT/PLAN:    ICD-10-CM   1. Branch retinal vein occlusion of left eye with macular edema  H34.8320 OCT, Retina - OU - Both Eyes  Intravitreal Injection, Pharmacologic Agent - OS - Left Eye    faricimab-svoa (VABYSMO) 6mg /0.78mL intravitreal injection    2. Essential hypertension  I10     3. Hypertensive retinopathy of both eyes  H35.033     4. Pseudophakia of both eyes  Z96.1      Superior HRVO w/ CME OS - s/p IVA OS #1 (10.09.23), #2 (11.06.23),  #3 (12.04.23), #4 (01.02.24), #5 (01.30.24) -- IVA resistance - s/p IVE OS #1 (02.28.24), #2 (03.27.24), #3 (04.24.24), #4 (05.22.24), #5 (06.20.24), #6 (07.19.24) -- IVE resistance - s/p IVV OS #1 (08.21.24), #2 (09.18.24) - BCVA OS 20/25  - exam shows continued improvement in diffuse retinal hemorrhages -- almost resolved - OCT shows persistent cystic changes temporal fovea, stable improvement in superior IRF/IRHM - recommend IVV OS #3 today, 10.21.24 w/ f/u in 4 wks - RBA of procedure discussed, questions answered - Vabysmo informed consent obtained and signed OS 08.21.24 - see procedure note - [employer] prior auth obtained - Vabysmo approved for 2024 - F/U 4 weeks -- DFE/OCT/possible injection  2,3. Hypertensive retinopathy OU - discussed importance of tight BP control - continue to monitor  4. Pseudophakia OU  - s/p CE/toric IOL OU  - IOLs in good position, doing well  - continue to monitor  Ophthalmic Meds Ordered this visit:  Meds ordered this encounter  Medications   faricimab-svoa (VABYSMO) 6mg /0.78mL intravitreal injection     Return in about 4 weeks (around 02/16/2023) for f/u HRVO OS, DFE, OCT.  There are no Patient Instructions on file for this visit.  This document serves as a record of services personally performed by Karie Chimera, MD, PhD. It was created on their behalf by Charlette Caffey, COT an ophthalmic technician. The creation of this record is the provider's dictation and/or activities during the visit.    Electronically signed by:  Charlette Caffey, COT  01/19/23 4:27 PM  This document serves as a record of services personally performed by Karie Chimera, MD, PhD. It was created on their behalf by Glee Arvin. Manson Passey, OA an ophthalmic technician. The creation of this record is the provider's dictation and/or activities during the visit.    Electronically signed by: Glee Arvin. Manson Passey, OA 01/19/23 4:27 PM  Karie Chimera, M.D., Ph.D. Diseases & Surgery  of the Retina and Vitreous [facility]  I have reviewed the above documentation for accuracy and completeness, and I agree with the above. Karie Chimera, M.D., Ph.D. 01/19/23 4:53 PM   Abbreviations: M myopia (nearsighted); A astigmatism; H hyperopia (farsighted); P presbyopia; Mrx spectacle prescription;  CTL contact lenses; OD right eye; OS left eye; OU both eyes  XT exotropia; ET esotropia; PEK punctate epithelial keratitis; PEE punctate epithelial erosions; DES dry eye syndrome; MGD meibomian gland dysfunction; ATs artificial tears; PFAT's preservative free artificial tears; NSC nuclear sclerotic cataract; PSC posterior subcapsular cataract; ERM epi-retinal membrane; PVD posterior vitreous detachment; RD retinal detachment; DM diabetes mellitus; DR diabetic retinopathy; NPDR non-proliferative diabetic retinopathy; PDR proliferative diabetic retinopathy; CSME clinically significant macular edema; DME diabetic macular edema; dbh dot blot hemorrhages; CWS cotton wool spot; POAG primary open angle glaucoma; C/D cup-to-disc ratio; HVF humphrey visual field; GVF goldmann visual field; OCT optical coherence tomography; IOP intraocular pressure; BRVO Branch retinal vein occlusion; CRVO central retinal vein occlusion; CRAO central retinal artery occlusion; BRAO branch retinal artery occlusion; RT retinal tear; SB scleral buckle; PPV pars plana vitrectomy; VH Vitreous hemorrhage; PRP panretinal laser photocoagulation;  IVK intravitreal kenalog; VMT vitreomacular traction; MH Macular hole;  NVD neovascularization of the disc; NVE neovascularization elsewhere; AREDS age related eye disease study; ARMD age related macular degeneration; POAG primary open angle glaucoma; EBMD epithelial/anterior basement membrane dystrophy; ACIOL anterior chamber intraocular lens; IOL intraocular lens; PCIOL posterior chamber intraocular lens; Phaco/IOL phacoemulsification with intraocular lens placement; PRK  photorefractive keratectomy; LASIK laser assisted in situ keratomileusis; HTN hypertension; DM diabetes mellitus; COPD chronic obstructive pulmonary disease

## 2023-01-19 ENCOUNTER — Ambulatory Visit (INDEPENDENT_AMBULATORY_CARE_PROVIDER_SITE_OTHER): Payer: Medicare Other | Admitting: Ophthalmology

## 2023-01-19 ENCOUNTER — Encounter (INDEPENDENT_AMBULATORY_CARE_PROVIDER_SITE_OTHER): Payer: Self-pay | Admitting: Ophthalmology

## 2023-01-19 DIAGNOSIS — Z961 Presence of intraocular lens: Secondary | ICD-10-CM

## 2023-01-19 DIAGNOSIS — H35033 Hypertensive retinopathy, bilateral: Secondary | ICD-10-CM

## 2023-01-19 DIAGNOSIS — I10 Essential (primary) hypertension: Secondary | ICD-10-CM | POA: Diagnosis not present

## 2023-01-19 DIAGNOSIS — H34832 Tributary (branch) retinal vein occlusion, left eye, with macular edema: Secondary | ICD-10-CM

## 2023-01-19 MED ORDER — FARICIMAB-SVOA 6 MG/0.05ML IZ SOSY
6.0000 mg | PREFILLED_SYRINGE | INTRAVITREAL | Status: AC | PRN
  Administered 2023-01-19: 6 mg via INTRAVITREAL

## 2023-02-03 NOTE — Progress Notes (Signed)
[facility] - Clinic Note  02/17/2023    CHIEF COMPLAINT Patient presents for Retina Follow Up  HISTORY OF PRESENT ILLNESS: Glenn Evetts. is a 89 y.o. male who presents to the clinic today for:   HPI     Retina Follow Up   Patient presents with  Other.  In left eye.  This started 4 weeks ago.  I, the attending physician,  performed the HPI with the patient and updated documentation appropriately.        Comments   Patient here for 4 weeks retina follow up for HRVO OS. Patient states vision doing fine. No eye pain.      Last edited by Rennis Chris, MD on 02/17/2023 12:05 PM.     Pt states the vision is doing well.   Referring physician: Sinda Du, MD [address] [place],  [place] [address]  HISTORICAL INFORMATION:   Selected notes from the MEDICAL RECORD NUMBER Referred by Dr. Cathey Endow for HRVO LEE:  Ocular Hx- PMH-    CURRENT MEDICATIONS: No current outpatient medications on file. (Ophthalmic Drugs)   No current facility-administered medications for this visit. (Ophthalmic Drugs)   Current Outpatient Medications (Other)  Medication Sig   acetaminophen (TYLENOL) 500 MG tablet Take 1,000 mg by mouth every 8 (eight) hours as needed for moderate pain.   allopurinol (ZYLOPRIM) 300 MG tablet Take 300 mg by mouth daily.    cephALEXin (KEFLEX) 500 MG capsule Take 1 capsule (500 mg total) by mouth 3 (three) times daily.   ibuprofen (ADVIL) 200 MG tablet Take 600 mg by mouth every 8 (eight) hours as needed for moderate pain.   Multiple Vitamins-Minerals (MULTIVITAMIN ADULT PO) Take 1 tablet by mouth daily.   ondansetron (ZOFRAN ODT) 4 MG disintegrating tablet Take 1 tablet (4 mg total) by mouth every 8 (eight) hours as needed for nausea or vomiting.   potassium chloride (KLOR-CON) 10 MEQ tablet Take 10 mEq by mouth 2 (two) times daily.   torsemide (DEMADEX) 10 MG tablet Take 5 mg by mouth daily as needed (fluid).    triamterene-hydrochlorothiazide (MAXZIDE-25) 37.5-25 MG tablet Take 1 tablet by mouth daily.   No current facility-administered medications for this visit. (Other)   REVIEW OF SYSTEMS: ROS   Positive for: Musculoskeletal, Eyes Negative for: Constitutional, Gastrointestinal, Neurological, Skin, Genitourinary, HENT, Endocrine, Cardiovascular, Respiratory, Psychiatric, Allergic/Imm, Heme/Lymph Last edited by Laddie Aquas, COA on 02/17/2023  9:40 AM.        ALLERGIES Allergies  Allergen Reactions   Famotidine Other (See Comments)   Tetracycline Hcl Other (See Comments)   Tetracyclines & Related Other (See Comments)    Infection of penis   PAST MEDICAL HISTORY Past Medical History:  Diagnosis Date   Anemia    Arthritis    CAD (coronary artery disease)    2007, inferior ischemia on nuclear scan, catheterization showed  60-70% ostial PDA, 90% distal RCA and a very large right coronary artery ( vision stent was placed 3.5 x 23 mm), 80-90% diagonal ( small vessel, wire could not be passed patient followed medically)   CAD (coronary artery disease)    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; 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 but considers himslef stable , he denies chest pain , headache, sob  , reports he does "heavy exercise " states " i work out with a trainer 2-3 times a week and go walking but i havent done much since  the gyms closed "    Cancer of kidney (HCC)    Creatinine elevation    Dieulafoy lesion of stomach    incidence of bleeds in 2015 and 2017 , required blood transfusion as bleed lef to a 4.5 hemoglobin   Gout    H/O right nephrectomy    History of blood transfusion    Hypertension    Skin cancer    skin , squamous cell    Tuberculosis 1962   Varicose veins of legs    bialtera; "ive had them about 5 years now and theyve never been a problem"    Past Surgical History:  Procedure Laterality Date   CATARACT EXTRACTION, BILATERAL  2017    COLONOSCOPY     CORONARY ANGIOPLASTY WITH STENT PLACEMENT  2006   ESOPHAGOGASTRODUODENOSCOPY  04/20/2012   Procedure: ESOPHAGOGASTRODUODENOSCOPY (EGD);  Surgeon: Graylin Shiver, MD;  Location[facility] ENDOSCOPY;  Service: Endoscopy;  Laterality: N/A;   NEPHRECTOMY Right    REPORTS AVG CREATININE 1.4 FOR THE LAST 20 YEARS    REVERSE SHOULDER ARTHROPLASTY Right 02/14/2020   Procedure: REVERSE SHOULDER ARTHROPLASTY;  Surgeon: Yolonda Kida, MD;  Location: [facility] ORS;  Service: Orthopedics;  Laterality: Right;  2.5 hrs   TOTAL KNEE ARTHROPLASTY Right 08/26/2018   Procedure: TOTAL KNEE ARTHROPLASTY;  Surgeon: Durene Romans, MD;  Location: [facility] ORS;  Service: Orthopedics;  Laterality: Right;  70 mins   FAMILY HISTORY History reviewed. No pertinent family history.  SOCIAL HISTORY Social History   Tobacco Use   Smoking status: Former    Current packs/day: 0.00    Types: Cigarettes    Quit date: 04/01/2021    Years since quitting: 1.8   Smokeless tobacco: Never   Tobacco comments:    7 cigarettes a day fo the last 10 years   Vaping Use   Vaping status: Never Used  Substance Use Topics   Alcohol use: Yes    Alcohol/week: 7.0 standard drinks of alcohol    Types: 7 Glasses of wine per week    Comment: daily    Drug use: No       OPHTHALMIC EXAM:  Base Eye Exam     Visual Acuity (Snellen - Linear)       Right Left   Dist cc 20/20 -1 20/25    Correction: Glasses         Tonometry (Tonopen, 9:38 AM)       Right Left   Pressure 19 19         Pupils       Dark Light Shape React APD   Right 3 2 Round Brisk None   Left 3 2 Round Brisk None         Visual Fields (Counting fingers)       Left Right    Full Full         Extraocular Movement       Right Left    Full, Ortho Full, Ortho         Neuro/Psych     Oriented x3: Yes   Mood/Affect: Normal         Dilation     Both eyes: 1.0% Mydriacyl, 2.5% Phenylephrine @ 9:38 AM           Slit Lamp and  Fundus Exam     Slit Lamp Exam       Right Left   Lids/Lashes Dermatochalasis - upper lid Dermatochalasis - upper lid   Conjunctiva/Sclera Mild  conjunctivochalasis Mild conjunctivochalasis   Cornea 1+ Punctate epithelial erosions 1+ Punctate epithelial erosions   Anterior Chamber deep and clear deep and clear   Iris Round and dilated Round and dilated   Lens Toric PC IOL in good position with marks at 0300 and 0900 Toric PC IOL in good position with marks at 0300 and 0900   Anterior Vitreous Vitreous syneresis, PVD, vit condensations Vitreous syneresis, Posterior vitreous detachment         Fundus Exam       Right Left   Disc mild Pallor, Sharp rim mild pallor, sharp rim, +hyperemia -- improved, vascular loops   C/D Ratio 0.7 0.6   Macula Flat, good foveal reflex, RPE mottling and clumping, No heme or edema, punctate CWS along ST arcades -- stably improved Blunted foveal reflex, confluent IRH superior macula -- vastly improved, now rare MA greatest temporally, central cystic changes -- persistent   Vessels attenuated, Tortuous attenuated, Tortuous, superior HRVO   Periphery Attached, No heme Attached, superior DBH -- improved, pigmented paving stone degeneration inferiorly           Refraction     Wearing Rx       Sphere Cylinder Axis Add   Right -1.00 +0.75 014 +2.75   Left -1.00 +1.50 148 +2.75           IMAGING AND PROCEDURES  Imaging and Procedures for 02/17/2023  OCT, Retina - OU - Both Eyes       Right Eye Quality was good. Central Foveal Thickness: 262. Progression has been stable. Findings include normal foveal contour, no IRF, no SRF, myopic contour, retinal drusen .   Left Eye Quality was good. Central Foveal Thickness: 307. Progression has worsened. Findings include no SRF, abnormal foveal contour, myopic contour, intraretinal hyper-reflective material, intraretinal fluid (Persistent cystic changes temporal fovea -- slightly increased, stable  improvement in superior IRF/IRHM).   Notes *Images captured and stored on drive  Diagnosis / Impression:  OD: NFP, no IRF/SRF OS: Persistent cystic changes temporal fovea -- slightly increased, stable improvement in superior IRF/IRHM  Clinical management:  See below  Abbreviations: NFP - Normal foveal profile. CME - cystoid macular edema. PED - pigment epithelial detachment. IRF - intraretinal fluid. SRF - subretinal fluid. EZ - ellipsoid zone. ERM - epiretinal membrane. ORA - outer retinal atrophy. ORT - outer retinal tubulation. SRHM - subretinal hyper-reflective material. IRHM - intraretinal hyper-reflective material      Intravitreal Injection, Pharmacologic Agent - OS - Left Eye       Time Out 02/17/2023. 10:30 AM. Confirmed correct patient, procedure, site, and patient consented.   Anesthesia Topical anesthesia was used. Anesthetic medications included Lidocaine 2%, Proparacaine 0.5%.   Procedure Preparation included 5% betadine to ocular surface, eyelid speculum. A (32g) needle was used.   Injection: 6 mg faricimab-svoa 6 MG/0.05ML   Route: Intravitreal, Site: Left Eye   NDC: 16109-604-54, Lot: [id number], Expiration date: 06/28/2024, Waste: 0 mL   Post-op Post injection exam found visual acuity of at least counting fingers. The patient tolerated the procedure well. There were no complications. The patient received written and verbal post procedure care education. Post injection medications were not given.             ASSESSMENT/PLAN:    ICD-10-CM   1. Branch retinal vein occlusion of left eye with macular edema  H34.8320 OCT, Retina - OU - Both Eyes    Intravitreal Injection, Pharmacologic Agent - OS - Left Eye  faricimab-svoa (VABYSMO) 6mg /0.25mL intravitreal injection    2. Essential hypertension  I10     3. Hypertensive retinopathy of both eyes  H35.033     4. Pseudophakia of both eyes  Z96.1      Superior HRVO w/ CME OS - s/p IVA OS #1 (10.09.23),  #2 (11.06.23), #3 (12.04.23), #4 (01.02.24), #5 (01.30.24) -- IVA resistance ================================================================= - s/p IVE OS #1 (02.28.24), #2 (03.27.24), #3 (04.24.24), #4 (05.22.24), #5 (06.20.24), #6 (07.19.24) -- IVE resistance ================================================================ - s/p IVV OS #1 (08.21.24), #2 (09.18.24), #3 (10.21.24) - BCVA OS 20/25 - exam shows continued improvement in diffuse retinal hemorrhages -- almost resolved - OCT shows Persistent cystic changes temporal fovea -- slightly increased, stable improvement in superior IRF/IRHM at 4 weeks - recommend IVV OS #4 today, 11.19.24 w/ f/u in 4 wks - RBA of procedure discussed, questions answered - Vabysmo informed consent obtained and signed OS 08.21.24 - see procedure note - [employer] prior auth obtained - Vabysmo approved for 2024 - F/U 4 weeks -- DFE/OCT/possible injection  2,3. Hypertensive retinopathy OU - discussed importance of tight BP control - continue to monitor  4. Pseudophakia OU  - s/p CE/toric IOL OU  - IOLs in good position, doing well  - continue to monitor  Ophthalmic Meds Ordered this visit:  Meds ordered this encounter  Medications   faricimab-svoa (VABYSMO) 6mg /0.47mL intravitreal injection     Return in about 4 weeks (around 03/17/2023) for f/u HRVO OS , DFE, OCT, Possible, IVV, OS.  There are no Patient Instructions on file for this visit.  This document serves as a record of services personally performed by Karie Chimera, MD, PhD. It was created on their behalf by Charlette Caffey, COT an ophthalmic technician. The creation of this record is the provider's dictation and/or activities during the visit.    Electronically signed by:  Charlette Caffey, COT  02/17/23 12:05 PM  Karie Chimera, M.D., Ph.D. Diseases & Surgery of the Retina and Vitreous [facility]  I have reviewed the above documentation for accuracy  and completeness, and I agree with the above. Karie Chimera, M.D., Ph.D. 02/17/23 12:06 PM   Abbreviations: M myopia (nearsighted); A astigmatism; H hyperopia (farsighted); P presbyopia; Mrx spectacle prescription;  CTL contact lenses; OD right eye; OS left eye; OU both eyes  XT exotropia; ET esotropia; PEK punctate epithelial keratitis; PEE punctate epithelial erosions; DES dry eye syndrome; MGD meibomian gland dysfunction; ATs artificial tears; PFAT's preservative free artificial tears; NSC nuclear sclerotic cataract; PSC posterior subcapsular cataract; ERM epi-retinal membrane; PVD posterior vitreous detachment; RD retinal detachment; DM diabetes mellitus; DR diabetic retinopathy; NPDR non-proliferative diabetic retinopathy; PDR proliferative diabetic retinopathy; CSME clinically significant macular edema; DME diabetic macular edema; dbh dot blot hemorrhages; CWS cotton wool spot; POAG primary open angle glaucoma; C/D cup-to-disc ratio; HVF humphrey visual field; GVF goldmann visual field; OCT optical coherence tomography; IOP intraocular pressure; BRVO Branch retinal vein occlusion; CRVO central retinal vein occlusion; CRAO central retinal artery occlusion; BRAO branch retinal artery occlusion; RT retinal tear; SB scleral buckle; PPV pars plana vitrectomy; VH Vitreous hemorrhage; PRP panretinal laser photocoagulation; IVK intravitreal kenalog; VMT vitreomacular traction; MH Macular hole;  NVD neovascularization of the disc; NVE neovascularization elsewhere; AREDS age related eye disease study; ARMD age related macular degeneration; POAG primary open angle glaucoma; EBMD epithelial/anterior basement membrane dystrophy; ACIOL anterior chamber intraocular lens; IOL intraocular lens; PCIOL posterior chamber intraocular lens; Phaco/IOL phacoemulsification with intraocular lens  placement; PRK photorefractive keratectomy; LASIK laser assisted in situ keratomileusis; HTN hypertension; DM diabetes mellitus; COPD  chronic obstructive pulmonary disease

## 2023-02-17 ENCOUNTER — Encounter (INDEPENDENT_AMBULATORY_CARE_PROVIDER_SITE_OTHER): Payer: Self-pay | Admitting: Ophthalmology

## 2023-02-17 ENCOUNTER — Ambulatory Visit (INDEPENDENT_AMBULATORY_CARE_PROVIDER_SITE_OTHER): Payer: Medicare Other | Admitting: Ophthalmology

## 2023-02-17 DIAGNOSIS — I10 Essential (primary) hypertension: Secondary | ICD-10-CM | POA: Diagnosis not present

## 2023-02-17 DIAGNOSIS — H34832 Tributary (branch) retinal vein occlusion, left eye, with macular edema: Secondary | ICD-10-CM | POA: Diagnosis not present

## 2023-02-17 DIAGNOSIS — H35033 Hypertensive retinopathy, bilateral: Secondary | ICD-10-CM | POA: Diagnosis not present

## 2023-02-17 DIAGNOSIS — Z961 Presence of intraocular lens: Secondary | ICD-10-CM

## 2023-02-17 MED ORDER — FARICIMAB-SVOA 6 MG/0.05ML IZ SOSY
6.0000 mg | PREFILLED_SYRINGE | INTRAVITREAL | Status: AC | PRN
  Administered 2023-02-17: 6 mg via INTRAVITREAL

## 2023-03-03 NOTE — Progress Notes (Signed)
[facility] - Clinic Note  03/17/2023    CHIEF COMPLAINT Patient presents for Retina Follow Up  HISTORY OF PRESENT ILLNESS: Glenn Hunt. is a 89 y.o. male who presents to the clinic today for:   HPI     Retina Follow Up   In left eye.  This started 4 weeks ago.  Duration of 4 weeks.  Since onset it is stable.        Comments   4 week retina follow up HRVO OS and IVV OS pt is reporting no vision changes noticed he denies any flashes just some floaters       Last edited by Etheleen Mayhew, COT on 03/17/2023 10:11 AM.      Patient feels the vision has not changed since the last visit.   Referring physician: Sinda Du, MD [address] [place],  [place] [address]  HISTORICAL INFORMATION:   Selected notes from the MEDICAL RECORD NUMBER Referred by Dr. Cathey Endow for HRVO LEE:  Ocular Hx- PMH-    CURRENT MEDICATIONS: No current outpatient medications on file. (Ophthalmic Drugs)   No current facility-administered medications for this visit. (Ophthalmic Drugs)   Current Outpatient Medications (Other)  Medication Sig   acetaminophen (TYLENOL) 500 MG tablet Take 1,000 mg by mouth every 8 (eight) hours as needed for moderate pain.   allopurinol (ZYLOPRIM) 300 MG tablet Take 300 mg by mouth daily.    cephALEXin (KEFLEX) 500 MG capsule Take 1 capsule (500 mg total) by mouth 3 (three) times daily.   ibuprofen (ADVIL) 200 MG tablet Take 600 mg by mouth every 8 (eight) hours as needed for moderate pain.   Multiple Vitamins-Minerals (MULTIVITAMIN ADULT PO) Take 1 tablet by mouth daily.   ondansetron (ZOFRAN ODT) 4 MG disintegrating tablet Take 1 tablet (4 mg total) by mouth every 8 (eight) hours as needed for nausea or vomiting.   potassium chloride (KLOR-CON) 10 MEQ tablet Take 10 mEq by mouth 2 (two) times daily.   torsemide (DEMADEX) 10 MG tablet Take 5 mg by mouth daily as needed (fluid).   triamterene-hydrochlorothiazide (MAXZIDE-25) 37.5-25  MG tablet Take 1 tablet by mouth daily.   No current facility-administered medications for this visit. (Other)   REVIEW OF SYSTEMS: ROS   Positive for: Musculoskeletal, Eyes Negative for: Constitutional, Gastrointestinal, Neurological, Skin, Genitourinary, HENT, Endocrine, Cardiovascular, Respiratory, Psychiatric, Allergic/Imm, Heme/Lymph Last edited by Etheleen Mayhew, COT on 03/17/2023 10:11 AM.         ALLERGIES Allergies  Allergen Reactions   Famotidine Other (See Comments)   Tetracycline Hcl Other (See Comments)   Tetracyclines & Related Other (See Comments)    Infection of penis   PAST MEDICAL HISTORY Past Medical History:  Diagnosis Date   Anemia    Arthritis    CAD (coronary artery disease)    2007, inferior ischemia on nuclear scan, catheterization showed  60-70% ostial PDA, 90% distal RCA and a very large right coronary artery ( vision stent was placed 3.5 x 23 mm), 80-90% diagonal ( small vessel, wire could not be passed patient followed medically)   CAD (coronary artery disease)    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; 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 but considers himslef stable , he denies chest pain , headache, sob  , reports he does "heavy exercise " states " i work out with a trainer 2-3 times a week and go walking but i havent done much  since the gyms closed "    Cancer of kidney (HCC)    Creatinine elevation    Dieulafoy lesion of stomach    incidence of bleeds in 2015 and 2017 , required blood transfusion as bleed lef to a 4.5 hemoglobin   Gout    H/O right nephrectomy    History of blood transfusion    Hypertension    Skin cancer    skin , squamous cell    Tuberculosis 1962   Varicose veins of legs    bialtera; "ive had them about 5 years now and theyve never been a problem"    Past Surgical History:  Procedure Laterality Date   CATARACT EXTRACTION, BILATERAL  2017   COLONOSCOPY     CORONARY ANGIOPLASTY WITH STENT  PLACEMENT  2006   ESOPHAGOGASTRODUODENOSCOPY  04/20/2012   Procedure: ESOPHAGOGASTRODUODENOSCOPY (EGD);  Surgeon: Graylin Shiver, MD;  Location: [facility] ENDOSCOPY;  Service: Endoscopy;  Laterality: N/A;   NEPHRECTOMY Right    REPORTS AVG CREATININE 1.4 FOR THE LAST 20 YEARS    REVERSE SHOULDER ARTHROPLASTY Right 02/14/2020   Procedure: REVERSE SHOULDER ARTHROPLASTY;  Surgeon: Yolonda Kida, MD;  Location: [facility] ORS;  Service: Orthopedics;  Laterality: Right;  2.5 hrs   TOTAL KNEE ARTHROPLASTY Right 08/26/2018   Procedure: TOTAL KNEE ARTHROPLASTY;  Surgeon: Durene Romans, MD;  Location: [facility] OR[facility];  Service: Orthopedics;  Laterality: Right;  70 mins   FAMILY HISTORY History reviewed. No pertinent family history.  SOCIAL HISTORY Social History   Tobacco Use   Smoking status: Former    Current packs/day: 0.00    Types: Cigarettes    Quit date: 04/01/2021    Years since quitting: 1.9   Smokeless tobacco: Never   Tobacco comments:    7 cigarettes a day fo the last 10 years   Vaping Use   Vaping status: Never Used  Substance Use Topics   Alcohol use: Yes    Alcohol/week: 7.0 standard drinks of alcohol    Types: 7 Glasses of wine per week    Comment: daily    Drug use: No       OPHTHALMIC EXAM:  Base Eye Exam     Visual Acuity (Snellen - Linear)       Right Left   Dist cc 20/25 -2 20/30   Dist ph cc NI NI    Correction: Glasses         Tonometry (Tonopen, 10:17 AM)       Right Left   Pressure 18 18         Pupils       Pupils Dark Light Shape React APD   Right PERRL 3 2 Round Brisk None   Left PERRL 3 2 Round Brisk None         Visual Fields       Left Right    Full Full         Extraocular Movement       Right Left    Full, Ortho Full, Ortho         Neuro/Psych     Oriented x3: Yes   Mood/Affect: Normal         Dilation     Both eyes: 2.5% Phenylephrine @ 10:17 AM           Slit Lamp and Fundus Exam     Slit Lamp Exam        Right Left   Lids/Lashes Dermatochalasis - upper lid Dermatochalasis -  upper lid   Conjunctiva/Sclera Mild conjunctivochalasis Mild conjunctivochalasis   Cornea 1+ Punctate epithelial erosions 1+ Punctate epithelial erosions   Anterior Chamber deep and clear deep and clear   Iris Round and dilated Round and dilated   Lens Toric PC IOL in good position with marks at 0300 and 0900 Toric PC IOL in good position with marks at 0300 and 0900   Anterior Vitreous Vitreous syneresis, PVD, vit condensations Vitreous syneresis, Posterior vitreous detachment         Fundus Exam       Right Left   Disc mild Pallor, Sharp rim mild pallor, sharp rim, +hyperemia -- improved, vascular loops   C/D Ratio 0.7 0.6   Macula Flat, good foveal reflex, RPE mottling and clumping, No heme or edema, punctate CWS along ST arcades -- stably improved Blunted foveal reflex, confluent IRH superior macula -- vastly improved, now rare MA greatest temporally, central cystic changes -- persistent   Vessels attenuated, Tortuous attenuated, Tortuous, superior HRVO   Periphery Attached, No heme Attached, superior DBH -- improved, pigmented paving stone degeneration inferiorly           Refraction     Wearing Rx       Sphere Cylinder Axis Add   Right -1.00 +0.75 014 +2.75   Left -1.00 +1.50 148 +2.75           IMAGING AND PROCEDURES  Imaging and Procedures for 03/17/2023  OCT, Retina - OU - Both Eyes       Right Eye Quality was good. Central Foveal Thickness: 262. Progression has been stable. Findings include normal foveal contour, no IRF, no SRF, myopic contour, retinal drusen .   Left Eye Quality was good. Central Foveal Thickness: 307. Progression has worsened. Findings include no SRF, abnormal foveal contour, myopic contour, intraretinal hyper-reflective material, intraretinal fluid (Persistent cystic changes temporal fovea -- slightly increased, stable improvement in superior IRF/IRHM).    Notes *Images captured and stored on drive  Diagnosis / Impression:  OD: NFP, no IRF/SRF OS: Persistent cystic changes temporal fovea -- slightly increased, stable improvement in superior IRF/IRHM  Clinical management:  See below  Abbreviations: NFP - Normal foveal profile. CME - cystoid macular edema. PED - pigment epithelial detachment. IRF - intraretinal fluid. SRF - subretinal fluid. EZ - ellipsoid zone. ERM - epiretinal membrane. ORA - outer retinal atrophy. ORT - outer retinal tubulation. SRHM - subretinal hyper-reflective material. IRHM - intraretinal hyper-reflective material              ASSESSMENT/PLAN:    ICD-10-CM   1. Branch retinal vein occlusion of left eye with macular edema  H34.8320 OCT, Retina - OU - Both Eyes    2. Essential hypertension  I10     3. Hypertensive retinopathy of both eyes  H35.033     4. Pseudophakia of both eyes  Z96.1       Superior HRVO w/ CME OS - s/p IVA OS #1 (10.09.23), #2 (11.06.23), #3 (12.04.23), #4 (01.02.24), #5 (01.30.24) -- IVA resistance ===================== - s/p IVE OS #1 (02.28.24), #2 (03.27.24), #3 (04.24.24), #4 (05.22.24), #5 (06.20.24), #6 (07.19.24) -- IVE resistance ===================== - s/p IVV OS #1 (08.21.24), #2 (09.18.24), #3 (10.21.24), #4 (11.19.24) - BCVA OS 20/30 - exam shows continued improvement in diffuse retinal hemorrhages -- almost resolved - OCT shows Persistent cystic changes temporal fovea -- slightly increased, stable improvement in superior IRF/IRHM at 4 weeks - recommend IVV OS #5 today, 12.17.24 w/ f/u in 4  wks - RBA of procedure discussed, questions answered - Vabysmo informed consent obtained and signed OS 08.21.24 - see procedure note - Eylea prior auth obtained - Vabysmo approved for 2024 - F/U 4 weeks -- DFE/OCT/possible injection  2,3. Hypertensive retinopathy OU - discussed importance of tight BP control - continue to monitor  4. Pseudophakia OU  - s/p CE/toric IOL  OU  - IOLs in good position, doing well  - continue to monitor  Ophthalmic Meds Ordered this visit:  No orders of the defined types were placed in this encounter.    No follow-ups on file.  There are no Patient Instructions on file for this visit.  This document serves as a record of services personally performed by Karie Chimera, MD, PhD. It was created on their behalf by Charlette Caffey, COT an ophthalmic technician. The creation of this record is the provider's dictation and/or activities during the visit.    Electronically signed by:  Charlette Caffey, COT  03/17/23 11:13 AM  Karie Chimera, M.D., Ph.D. Diseases & Surgery of the Retina and Vitreous [facility]    Abbreviations: M myopia (nearsighted); A astigmatism; H hyperopia (farsighted); P presbyopia; Mrx spectacle prescription;  CTL contact lenses; OD right eye; OS left eye; OU both eyes  XT exotropia; ET esotropia; PEK punctate epithelial keratitis; PEE punctate epithelial erosions; DES dry eye syndrome; MGD meibomian gland dysfunction; ATs artificial tears; PFAT's preservative free artificial tears; NSC nuclear sclerotic cataract; PSC posterior subcapsular cataract; ERM epi-retinal membrane; PVD posterior vitreous detachment; RD retinal detachment; DM diabetes mellitus; DR diabetic retinopathy; NPDR non-proliferative diabetic retinopathy; PDR proliferative diabetic retinopathy; CSME clinically significant macular edema; DME diabetic macular edema; dbh dot blot hemorrhages; CWS cotton wool spot; POAG primary open angle glaucoma; C/D cup-to-disc ratio; HVF humphrey visual field; GVF goldmann visual field; OCT optical coherence tomography; IOP intraocular pressure; BRVO Branch retinal vein occlusion; CRVO central retinal vein occlusion; CRAO central retinal artery occlusion; BRAO branch retinal artery occlusion; RT retinal tear; SB scleral buckle; PPV pars plana vitrectomy; VH Vitreous hemorrhage; PRP  panretinal laser photocoagulation; IVK intravitreal kenalog; VMT vitreomacular traction; MH Macular hole;  NVD neovascularization of the disc; NVE neovascularization elsewhere; AREDS age related eye disease study; ARMD age related macular degeneration; POAG primary open angle glaucoma; EBMD epithelial/anterior basement membrane dystrophy; ACIOL anterior chamber intraocular lens; IOL intraocular lens; PCIOL posterior chamber intraocular lens; Phaco/IOL phacoemulsification with intraocular lens placement; PRK photorefractive keratectomy; LASIK laser assisted in situ keratomileusis; HTN hypertension; DM diabetes mellitus; COPD chronic obstructive pulmonary disease

## 2023-03-17 ENCOUNTER — Encounter (INDEPENDENT_AMBULATORY_CARE_PROVIDER_SITE_OTHER): Payer: Self-pay | Admitting: Ophthalmology

## 2023-03-17 ENCOUNTER — Ambulatory Visit (INDEPENDENT_AMBULATORY_CARE_PROVIDER_SITE_OTHER): Payer: Medicare Other | Admitting: Ophthalmology

## 2023-03-17 DIAGNOSIS — Z961 Presence of intraocular lens: Secondary | ICD-10-CM

## 2023-03-17 DIAGNOSIS — H34832 Tributary (branch) retinal vein occlusion, left eye, with macular edema: Secondary | ICD-10-CM | POA: Diagnosis not present

## 2023-03-17 DIAGNOSIS — I10 Essential (primary) hypertension: Secondary | ICD-10-CM | POA: Diagnosis not present

## 2023-03-17 DIAGNOSIS — H35033 Hypertensive retinopathy, bilateral: Secondary | ICD-10-CM | POA: Diagnosis not present

## 2023-03-17 MED ORDER — FARICIMAB-SVOA 6 MG/0.05ML IZ SOSY
6.0000 mg | PREFILLED_SYRINGE | INTRAVITREAL | Status: AC | PRN
  Administered 2023-03-17: 6 mg via INTRAVITREAL

## 2023-03-31 NOTE — Progress Notes (Signed)
 [facility] - Clinic Note  04/14/2023    CHIEF COMPLAINT Patient presents for Retina Follow Up  HISTORY OF PRESENT ILLNESS: Glenn Hunt. is a 89 y.o. male who presents to the clinic today for:   HPI     Retina Follow Up   Patient presents with  Other.  In left eye.  This started 4 weeks ago.  I, the attending physician,  performed the HPI with the patient and updated documentation appropriately.        Comments   Patient here for 4 weeks retina follow up for HRVO OS. Patient states vision no problems. No eye pain. Sees small floaters, very few of them. On prednisone for pain in left leg.       Last edited by Lene Mckay, MD on 04/14/2023  4:48 PM.    Patient states he has never had a problem with his vision, he sees tiny floaters, which are not new and are not causing a problem  Referring physician: Waylan Cain, MD [address] [place],  [place] [address]  HISTORICAL INFORMATION:   Selected notes from the MEDICAL RECORD NUMBER Referred by Dr. Waylan for HRVO LEE:  Ocular Hx- PMH-    CURRENT MEDICATIONS: No current outpatient medications on file. (Ophthalmic Drugs)   No current facility-administered medications for this visit. (Ophthalmic Drugs)   Current Outpatient Medications (Other)  Medication Sig   acetaminophen  (TYLENOL ) 500 MG tablet Take 1,000 mg by mouth every 8 (eight) hours as needed for moderate pain.   allopurinol  (ZYLOPRIM ) 300 MG tablet Take 300 mg by mouth daily.    cephALEXin  (KEFLEX ) 500 MG capsule Take 1 capsule (500 mg total) by mouth 3 (three) times daily.   ibuprofen (ADVIL) 200 MG tablet Take 600 mg by mouth every 8 (eight) hours as needed for moderate pain.   Multiple Vitamins-Minerals (MULTIVITAMIN ADULT PO) Take 1 tablet by mouth daily.   ondansetron  (ZOFRAN  ODT) 4 MG disintegrating tablet Take 1 tablet (4 mg total) by mouth every 8 (eight) hours as needed for nausea or vomiting.   potassium chloride  (KLOR-CON )  10 MEQ tablet Take 10 mEq by mouth 2 (two) times daily.   torsemide  (DEMADEX ) 10 MG tablet Take 5 mg by mouth daily as needed (fluid).   triamterene -hydrochlorothiazide  (MAXZIDE -25) 37.5-25 MG tablet Take 1 tablet by mouth daily.   No current facility-administered medications for this visit. (Other)   REVIEW OF SYSTEMS: ROS   Positive for: Musculoskeletal, Eyes Negative for: Constitutional, Gastrointestinal, Neurological, Skin, Genitourinary, HENT, Endocrine, Cardiovascular, Respiratory, Psychiatric, Allergic/Imm, Heme/Lymph Last edited by Glenn Hunt, COA on 04/14/2023  9:13 AM.      ALLERGIES Allergies  Allergen Reactions   Famotidine Other (See Comments)   Tetracycline Hcl Other (See Comments)   Tetracyclines & Related Other (See Comments)    Infection of penis   PAST MEDICAL HISTORY Past Medical History:  Diagnosis Date   Anemia    Arthritis    CAD (coronary artery disease)    2007, inferior ischemia on nuclear scan, catheterization showed  60-70% ostial PDA, 90% distal RCA and a very large right coronary artery ( vision stent was placed 3.5 x 23 mm), 80-90% diagonal ( small vessel, wire could not be passed patient followed medically)   CAD (coronary artery disease)    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; 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 but considers himslef stable , he denies chest pain , headache,  sob  , reports he does heavy exercise  states  i work out with a trainer 2-3 times a week and go walking but i havent done much since the gyms closed     Cancer of kidney (HCC)    Creatinine elevation    Dieulafoy lesion of stomach    incidence of bleeds in 2015 and 2017 , required blood transfusion as bleed lef to a 4.5 hemoglobin   Gout    H/O right nephrectomy    History of blood transfusion    Hypertension    Skin cancer    skin , squamous cell    Tuberculosis 1962   Varicose veins of legs    bialtera; ive had them about 5 years now and  theyve never been a problem    Past Surgical History:  Procedure Laterality Date   CATARACT EXTRACTION, BILATERAL  2017   COLONOSCOPY     CORONARY ANGIOPLASTY WITH STENT PLACEMENT  2006   ESOPHAGOGASTRODUODENOSCOPY  04/20/2012   Procedure: ESOPHAGOGASTRODUODENOSCOPY (EGD);  Surgeon: Lesta JULIANNA Fitz, MD;  Location: [facility];  Service: Endoscopy;  Laterality: N/A;   NEPHRECTOMY Right    REPORTS AVG CREATININE 1.4 FOR THE LAST 20 YEARS    REVERSE SHOULDER ARTHROPLASTY Right 02/14/2020   Procedure: REVERSE SHOULDER ARTHROPLASTY;  Surgeon: Sharl Selinda Dover, MD;  Location: [facility];  Service: Orthopedics;  Laterality: Right;  2.5 hrs   TOTAL KNEE ARTHROPLASTY Right 08/26/2018   Procedure: TOTAL KNEE ARTHROPLASTY;  Surgeon: Ernie Cough, MD;  Location: [facility];  Service: Orthopedics;  Laterality: Right;  70 mins   FAMILY HISTORY History reviewed. No pertinent family history.  SOCIAL HISTORY Social History   Tobacco Use   Smoking status: Former    Current packs/day: 0.00    Types: Cigarettes    Quit date: 04/01/2021    Years since quitting: 2.0   Smokeless tobacco: Never   Tobacco comments:    7 cigarettes a day fo the last 10 years   Vaping Use   Vaping status: Never Used  Substance Use Topics   Alcohol use: Yes    Alcohol/week: 7.0 standard drinks of alcohol    Types: 7 Glasses of wine per week    Comment: daily    Drug use: No       OPHTHALMIC EXAM:  Base Eye Exam     Visual Acuity (Snellen - Linear)       Right Left   Dist cc 20/25 +1 20/30 -2   Dist ph cc NI NI    Correction: Glasses         Tonometry (Tonopen, 9:10 AM)       Right Left   Pressure 17 14         Pupils       Dark Light Shape React APD   Right 3 2 Round Brisk None   Left 3 2 Round Brisk None         Visual Fields (Counting fingers)       Left Right    Full Full         Extraocular Movement       Right Left    Full, Ortho Full, Ortho         Neuro/Psych      Oriented x3: Yes   Mood/Affect: Normal         Dilation     Both eyes: 1.0% Mydriacyl, 2.5% Phenylephrine  @ 9:10 AM  Slit Lamp and Fundus Exam     Slit Lamp Exam       Right Left   Lids/Lashes Dermatochalasis - upper lid Dermatochalasis - upper lid   Conjunctiva/Sclera Mild conjunctivochalasis Mild conjunctivochalasis   Cornea 1+ Punctate epithelial erosions 1+ Punctate epithelial erosions   Anterior Chamber deep and clear deep and clear   Iris Round and dilated Round and dilated   Lens Toric PC IOL in good position with marks at 0300 and 0900 Toric PC IOL in good position with marks at 0300 and 0900   Anterior Vitreous Vitreous syneresis, PVD, vit condensations Vitreous syneresis, Posterior vitreous detachment         Fundus Exam       Right Left   Disc mild Pallor, Sharp rim mild pallor, sharp rim, +hyperemia -- improved, vascular loops   C/D Ratio 0.7 0.6   Macula Flat, good foveal reflex, RPE mottling and clumping, No heme or edema Blunted foveal reflex, confluent IRH superior macula -- vastly improved, now rare MA greatest temporally, central cystic changes -- improved   Vessels attenuated, Tortuous attenuated, Tortuous, superior HRVO   Periphery Attached, No heme Attached, superior DBH -- improved, pigmented paving stone degeneration inferiorly           Refraction     Wearing Rx       Sphere Cylinder Axis Add   Right -1.00 +0.75 014 +2.75   Left -1.00 +1.50 148 +2.75           IMAGING AND PROCEDURES  Imaging and Procedures for 04/14/2023  OCT, Retina - OU - Both Eyes       Right Eye Quality was good. Central Foveal Thickness: 257. Progression has been stable. Findings include normal foveal contour, no IRF, no SRF, myopic contour, retinal drusen .   Left Eye Quality was good. Central Foveal Thickness: 290. Progression has improved. Findings include no SRF, abnormal foveal contour, myopic contour, intraretinal hyper-reflective material,  intraretinal fluid (Persistent cystic changes temporal fovea -- slightly improved, stable improvement in superior IRF/IRHM).   Notes *Images captured and stored on drive  Diagnosis / Impression:  OD: NFP, no IRF/SRF OS: Persistent cystic changes temporal fovea -- slightly improved, stable improvement in superior IRF/IRHM  Clinical management:  See below  Abbreviations: NFP - Normal foveal profile. CME - cystoid macular edema. PED - pigment epithelial detachment. IRF - intraretinal fluid. SRF - subretinal fluid. EZ - ellipsoid zone. ERM - epiretinal membrane. ORA - outer retinal atrophy. ORT - outer retinal tubulation. SRHM - subretinal hyper-reflective material. IRHM - intraretinal hyper-reflective material      Intravitreal Injection, Pharmacologic Agent - OS - Left Eye       Time Out 04/14/2023. 10:01 AM. Confirmed correct patient, procedure, site, and patient consented.   Anesthesia Topical anesthesia was used. Anesthetic medications included Lidocaine  2%, Proparacaine 0.5%.   Procedure Preparation included 5% betadine  to ocular surface, eyelid speculum. A (32g) needle was used.   Injection: 6 mg faricimab -svoa 6 MG/0.05ML   Route: Intravitreal, Site: Left Eye   NDC: 49757-903-98, Lot: [id number], Expiration date: 09/27/2024, Waste: 0 mL   Post-op Post injection exam found visual acuity of at least counting fingers. The patient tolerated the procedure well. There were no complications. The patient received written and verbal post procedure care education. Post injection medications were not given.   Notes **SAMPLE MEDICATION ADMINISTERED**           ASSESSMENT/PLAN:    ICD-10-CM   1. 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 Essential hypertension  I10     3. Hypertensive  retinopathy of both eyes  H35.033     4. Pseudophakia of both eyes  Z96.1      Superior HRVO w/ CME OS - s/p IVA OS #1 (10.09.23), #2 (11.06.23), #3 (12.04.23), #4 (01.02.24), #5 (01.30.24) -- IVA resistance ===================== - s/p IVE OS #1 (02.28.24), #2 (03.27.24), #3 (04.24.24), #4 (05.22.24), #5 (06.20.24), #6 (07.19.24) -- IVE resistance ===================== - s/p IVV OS #1 (08.21.24), #2 (09.18.24), #3 (10.21.24), #4 (11.19.24), #5 (12.17.24) - BCVA OS 20/30 - exam shows continued improvement in diffuse retinal hemorrhages -- almost resolved - OCT shows Persistent cystic changes temporal fovea -- slightly improved, stable improvement in superior IRF/IRHM at 4 weeks - recommend IVV OS #6 today (sample), 1.14.25 w/ f/u in 4 wks - RBA of procedure discussed, questions answered - Vabysmo  informed consent obtained and signed OS 08.21.24 - see procedure note - [place]  prior auth obtained for 2025, but no funding for Good Days - Vabysmo  approved for 2025 - F/U 4 weeks -- DFE/OCT/possible injection  2,3. Hypertensive retinopathy OU - discussed importance of tight BP control - continue to monitor  4. Pseudophakia OU  - s/p CE/toric IOL OU  - IOLs in good position, doing well  - continue to monitor  Ophthalmic Meds Ordered this visit:  Meds ordered this encounter  Medications   faricimab -svoa (VABYSMO ) 6mg /0.44mL intravitreal injection     Return for f/u 4-5 weeks, BRVO OS, DFE, OCT.  There are no Patient Instructions on file for this visit.  This document serves as a record of services personally performed by Redell JUDITHANN Hans, MD, PhD. It was created on their behalf by Wanda GEANNIE Keens, COT an ophthalmic technician. The creation of this record is the provider's dictation and/or activities during the visit.    Electronically signed by:  Wanda GEANNIE Keens, COT  04/14/23 4:49 PM  This document serves as a record of services personally performed by Redell JUDITHANN Hans, MD,  PhD. It was created on their behalf by Alan PARAS. Delores, OA an ophthalmic technician. The creation of this record is the provider's dictation and/or activities during the visit.    Electronically signed by: Alan PARAS. Delores, OA 04/14/23 4:49 PM  Redell JUDITHANN Hans, M.D., Ph.D. Diseases & Surgery of the Retina and Vitreous [facility]  I have reviewed the above documentation for accuracy and completeness, and I agree with the above. Redell JUDITHANN Hans, M.D., Ph.D. 04/14/23 4:50 PM   Abbreviations: M myopia (nearsighted); A astigmatism; H hyperopia (farsighted); P presbyopia; Mrx spectacle prescription;  CTL contact lenses; OD right eye; OS left eye; OU both eyes  XT exotropia; ET esotropia; PEK punctate epithelial keratitis; PEE punctate epithelial erosions; DES dry eye syndrome; MGD meibomian gland dysfunction; ATs artificial tears; PFAT's preservative free artificial tears; NSC nuclear sclerotic cataract; PSC posterior subcapsular cataract; ERM epi-retinal membrane; PVD posterior vitreous detachment; RD retinal detachment; DM diabetes mellitus; DR diabetic retinopathy; NPDR non-proliferative diabetic retinopathy; PDR proliferative diabetic retinopathy; CSME clinically significant macular edema; DME diabetic macular edema; dbh dot blot hemorrhages; CWS cotton wool spot; POAG primary open angle glaucoma; C/D cup-to-disc ratio; HVF humphrey visual field; GVF goldmann visual field; OCT optical coherence tomography; IOP intraocular  pressure; BRVO Branch retinal vein occlusion; CRVO central retinal vein occlusion; CRAO central retinal artery occlusion; BRAO branch retinal artery occlusion; RT retinal tear; SB scleral buckle; PPV pars plana vitrectomy; VH Vitreous hemorrhage; PRP panretinal laser photocoagulation; IVK intravitreal kenalog ; VMT vitreomacular traction; MH Macular hole;  NVD neovascularization of the disc; NVE neovascularization elsewhere; AREDS age related eye disease study; ARMD  age related macular degeneration; POAG primary open angle glaucoma; EBMD epithelial/anterior basement membrane dystrophy; ACIOL anterior chamber intraocular lens; IOL intraocular lens; PCIOL posterior chamber intraocular lens; Phaco/IOL phacoemulsification with intraocular lens placement; PRK photorefractive keratectomy; LASIK laser assisted in situ keratomileusis; HTN hypertension; DM diabetes mellitus; COPD chronic obstructive pulmonary disease

## 2023-04-14 ENCOUNTER — Encounter (INDEPENDENT_AMBULATORY_CARE_PROVIDER_SITE_OTHER): Payer: Self-pay | Admitting: Ophthalmology

## 2023-04-14 ENCOUNTER — Ambulatory Visit (INDEPENDENT_AMBULATORY_CARE_PROVIDER_SITE_OTHER): Payer: Medicare Other | Admitting: Ophthalmology

## 2023-04-14 DIAGNOSIS — H35033 Hypertensive retinopathy, bilateral: Secondary | ICD-10-CM

## 2023-04-14 DIAGNOSIS — H34832 Tributary (branch) retinal vein occlusion, left eye, with macular edema: Secondary | ICD-10-CM | POA: Diagnosis not present

## 2023-04-14 DIAGNOSIS — I10 Essential (primary) hypertension: Secondary | ICD-10-CM

## 2023-04-14 DIAGNOSIS — Z961 Presence of intraocular lens: Secondary | ICD-10-CM | POA: Diagnosis not present

## 2023-04-14 MED ORDER — FARICIMAB-SVOA 6 MG/0.05ML IZ SOLN
6.0000 mg | INTRAVITREAL | Status: AC | PRN
  Administered 2023-04-14: 6 mg via INTRAVITREAL

## 2023-05-05 NOTE — Progress Notes (Signed)
[facility] - Clinic Note  05/19/2023    CHIEF COMPLAINT Patient presents for Retina Follow Up  HISTORY OF PRESENT ILLNESS: Glenn Hunt. is a 89 y.o. male who presents to the clinic today for:   HPI     Retina Follow Up   Patient presents with  Other.  In left eye.  This started 4 weeks ago.  I, the attending physician,  performed the HPI with the patient and updated documentation appropriately.        Comments   Patient states the vision is doing well. He is using AT's.      Last edited by Rennis Chris, MD on 05/19/2023 12:31 PM.     Patient states   Referring physician: Sinda Du, MD [address] [place],  [place] [address]  HISTORICAL INFORMATION:   Selected notes from the MEDICAL RECORD NUMBER Referred by Dr. Cathey Endow for HRVO LEE:  Ocular Hx- PMH-    CURRENT MEDICATIONS: No current outpatient medications on file. (Ophthalmic Drugs)   No current facility-administered medications for this visit. (Ophthalmic Drugs)   Current Outpatient Medications (Other)  Medication Sig   acetaminophen (TYLENOL) 500 MG tablet Take 1,000 mg by mouth every 8 (eight) hours as needed for moderate pain.   allopurinol (ZYLOPRIM) 300 MG tablet Take 300 mg by mouth daily.    cephALEXin (KEFLEX) 500 MG capsule Take 1 capsule (500 mg total) by mouth 3 (three) times daily.   ibuprofen (ADVIL) 200 MG tablet Take 600 mg by mouth every 8 (eight) hours as needed for moderate pain.   Multiple Vitamins-Minerals (MULTIVITAMIN ADULT PO) Take 1 tablet by mouth daily.   ondansetron (ZOFRAN ODT) 4 MG disintegrating tablet Take 1 tablet (4 mg total) by mouth every 8 (eight) hours as needed for nausea or vomiting.   potassium chloride (KLOR-CON) 10 MEQ tablet Take 10 mEq by mouth 2 (two) times daily.   torsemide (DEMADEX) 10 MG tablet Take 5 mg by mouth daily as needed (fluid).   triamterene-hydrochlorothiazide (MAXZIDE-25) 37.5-25 MG tablet Take 1 tablet by mouth daily.    No current facility-administered medications for this visit. (Other)   REVIEW OF SYSTEMS: ROS   Positive for: Musculoskeletal, Eyes Negative for: Constitutional, Gastrointestinal, Neurological, Skin, Genitourinary, HENT, Endocrine, Cardiovascular, Respiratory, Psychiatric, Allergic/Imm, Heme/Lymph Last edited by Charlette Caffey, COT on 05/19/2023  9:19 AM.       ALLERGIES Allergies  Allergen Reactions   Famotidine Other (See Comments)   Tetracycline Hcl Other (See Comments)   Tetracyclines & Related Other (See Comments)    Infection of penis   PAST MEDICAL HISTORY Past Medical History:  Diagnosis Date   Anemia    Arthritis    CAD (coronary artery disease)    2007, inferior ischemia on nuclear scan, catheterization showed  60-70% ostial PDA, 90% distal RCA and a very large right coronary artery ( vision stent was placed 3.5 x 23 mm), 80-90% diagonal ( small vessel, wire could not be passed patient followed medically)   CAD (coronary artery disease)    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; 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 but considers himslef stable , he denies chest pain , headache, sob  , reports he does "heavy exercise " states " i work out with a trainer 2-3 times a week and go walking but i havent done much since the gyms closed "    Cancer of kidney (HCC)  Creatinine elevation    Dieulafoy lesion of stomach    incidence of bleeds in 2015 and 2017 , required blood transfusion as bleed lef to a 4.5 hemoglobin   Gout    H/O right nephrectomy    History of blood transfusion    Hypertension    Skin cancer    skin , squamous cell    Tuberculosis 1962   Varicose veins of legs    bialtera; "ive had them about 5 years now and theyve never been a problem"    Past Surgical History:  Procedure Laterality Date   CATARACT EXTRACTION, BILATERAL  2017   COLONOSCOPY     CORONARY ANGIOPLASTY WITH STENT PLACEMENT  2006   ESOPHAGOGASTRODUODENOSCOPY   04/20/2012   Procedure: ESOPHAGOGASTRODUODENOSCOPY (EGD);  Surgeon: Graylin Shiver, MD;  Location: [facility] ENDOSCOPY;  Service: Endoscopy;  Laterality: N/A;   NEPHRECTOMY Right    REPORTS AVG CREATININE 1.4 FOR THE LAST 20 YEARS    REVERSE SHOULDER ARTHROPLASTY Right 02/14/2020   Procedure: REVERSE SHOULDER ARTHROPLASTY;  Surgeon: Yolonda Kida, MD;  Location: [facility] ORS;  Service: Orthopedics;  Laterality: Right;  2.5 hrs   TOTAL KNEE ARTHROPLASTY Right 08/26/2018   Procedure: TOTAL KNEE ARTHROPLASTY;  Surgeon: Durene Romans, MD;  Location: [facility] ORS;  Service: Orthopedics;  Laterality: Right;  70 mins   FAMILY HISTORY History reviewed. No pertinent family history.  SOCIAL HISTORY Social History   Tobacco Use   Smoking status: Former    Current packs/day: 0.00    Types: Cigarettes    Quit date: 04/01/2021    Years since quitting: 2.1   Smokeless tobacco: Never   Tobacco comments:    7 cigarettes a day fo the last 10 years   Vaping Use   Vaping status: Never Used  Substance Use Topics   Alcohol use: Yes    Alcohol/week: 7.0 standard drinks of alcohol    Types: 7 Glasses of wine per week    Comment: daily    Drug use: No       OPHTHALMIC EXAM:  Base Eye Exam     Visual Acuity (Snellen - Linear)       Right Left   Dist cc 20/20 20/30   Dist ph cc  NI    Correction: Glasses         Tonometry (Tonopen, 9:22 AM)       Right Left   Pressure 15 14         Pupils       Dark Light Shape React APD   Right 3 2 Round Brisk None   Left 3 2 Round Brisk None         Visual Fields       Left Right    Full Full         Extraocular Movement       Right Left    Full, Ortho Full, Ortho         Neuro/Psych     Oriented x3: Yes   Mood/Affect: Normal         Dilation     Both eyes: 1.0% Mydriacyl, 2.5% Phenylephrine @ 9:20 AM           Slit Lamp and Fundus Exam     Slit Lamp Exam       Right Left   Lids/Lashes Dermatochalasis - upper lid  Dermatochalasis - upper lid   Conjunctiva/Sclera Mild conjunctivochalasis Mild conjunctivochalasis   Cornea 1+ Punctate epithelial erosions  1+ Punctate epithelial erosions   Anterior Chamber deep and clear deep and clear   Iris Round and dilated Round and dilated   Lens Toric PC IOL in good position with marks at 0300 and 0900 Toric PC IOL in good position with marks at 0300 and 0900   Anterior Vitreous Vitreous syneresis, PVD, vit condensations Vitreous syneresis, Posterior vitreous detachment         Fundus Exam       Right Left   Disc mild Pallor, Sharp rim mild pallor, sharp rim, +hyperemia -- improved, mild vascular loops nasal disc   C/D Ratio 0.7 0.6   Macula Flat, good foveal reflex, RPE mottling and clumping, No heme or edema Blunted foveal reflex, confluent IRH superior macula -- vastly improved, now rare MA greatest temporally, central cystic changes -- slightly improved   Vessels attenuated, Tortuous attenuated, Tortuous, superior HRVO   Periphery Attached, No heme Attached, superior DBH -- improved, pigmented paving stone degeneration inferiorly           IMAGING AND PROCEDURES  Imaging and Procedures for 05/19/2023  OCT, Retina - OU - Both Eyes       Right Eye Quality was good. Central Foveal Thickness: 261. Progression has been stable. Findings include normal foveal contour, no IRF, no SRF, myopic contour, retinal drusen .   Left Eye Quality was good. Central Foveal Thickness: 292. Progression has improved. Findings include no SRF, abnormal foveal contour, myopic contour, intraretinal hyper-reflective material, intraretinal fluid (Persistent cystic changes temporal fovea -- slightly improved, stable improvement in superior IRF/IRHM).   Notes *Images captured and stored on drive  Diagnosis / Impression:  OD: NFP, no IRF/SRF OS: Persistent cystic changes temporal fovea -- slightly improved, stable improvement in superior IRF/IRHM  Clinical management:  See  below  Abbreviations: NFP - Normal foveal profile. CME - cystoid macular edema. PED - pigment epithelial detachment. IRF - intraretinal fluid. SRF - subretinal fluid. EZ - ellipsoid zone. ERM - epiretinal membrane. ORA - outer retinal atrophy. ORT - outer retinal tubulation. SRHM - subretinal hyper-reflective material. IRHM - intraretinal hyper-reflective material      Intravitreal Injection, Pharmacologic Agent - OS - Left Eye       Time Out 05/19/2023. 10:27 AM. Confirmed correct patient, procedure, site, and patient consented.   Anesthesia Topical anesthesia was used. Anesthetic medications included Lidocaine 2%, Proparacaine 0.5%.   Procedure Preparation included 5% betadine to ocular surface, eyelid speculum. A (32g) needle was used.   Injection: 6 mg faricimab-svoa 6 MG/0.05ML   Route: Intravitreal, Site: Left Eye   NDC: 16109-604-54, Lot: [id number], Expiration date: 07/28/2024, Waste: 0 mL   Post-op Post injection exam found visual acuity of at least counting fingers. The patient tolerated the procedure well. There were no complications. The patient received written and verbal post procedure care education. Post injection medications were not given.             ASSESSMENT/PLAN:    ICD-10-CM   1. Branch retinal vein occlusion of left eye with macular edema  H34.8320 OCT, Retina - OU - Both Eyes    Intravitreal Injection, Pharmacologic Agent - OS - Left Eye    faricimab-svoa (VABYSMO) 6mg /0.61mL intravitreal injection    2. Essential hypertension  I10     3. Hypertensive retinopathy of both eyes  H35.033     4. Pseudophakia of both eyes  Z96.1       Superior HRVO w/ CME OS - s/p IVA OS #1 (10.09.23), #2 (  11.06.23), #3 (12.04.23), #4 (01.02.24), #5 (01.30.24) -- IVA resistance ===================== - s/p IVE OS #1 (02.28.24), #2 (03.27.24), #3 (04.24.24), #4 (05.22.24), #5 (06.20.24), #6 (07.19.24) -- IVE resistance ===================== - s/p IVV OS #1  (08.21.24), #2 (09.18.24), #3 (10.21.24), #4 (11.19.24), #5 (12.17.24), #6 (sample 01.14.25) - BCVA OS 20/30 - stable - exam shows continued improvement in diffuse retinal hemorrhages -- essentially resolved - OCT shows Persistent cystic changes temporal fovea -- slightly improved, stable improvement in superior IRF/IRHM at 5 weeks - recommend IVV OS #7 today, 02.18.25 w/ f/u ext to 6 wks - RBA of procedure discussed, questions answered - Vabysmo informed consent obtained and signed OS 08.21.24 - see procedure note - [employer] prior auth obtained for 2025, Vabysmo approved for 2025, but [employer] funding unavailable - pt covering 20% coinsurance on medication - F/U in 6 weeks -- DFE/OCT/possible injection  2,3. Hypertensive retinopathy OU - discussed importance of tight BP control - continue to monitor  4. Pseudophakia OU  - s/p CE/toric IOL OU  - IOLs in good position, doing well  - continue to monitor  Ophthalmic Meds Ordered this visit:  Meds ordered this encounter  Medications   faricimab-svoa (VABYSMO) 6mg /0.57mL intravitreal injection     Return in 6 weeks (on 06/30/2023) for HRVO OS, DFE, OCT, Possible Injxn.  There are no Patient Instructions on file for this visit.  This document serves as a record of services personally performed by Karie Chimera, MD, PhD. It was created on their behalf by Charlette Caffey, COT an ophthalmic technician. The creation of this record is the provider's dictation and/or activities during the visit.    Electronically signed by:  Charlette Caffey, COT  05/19/23 12:32 PM  Karie Chimera, M.D., Ph.D. Diseases & Surgery of the Retina and Vitreous [facility]  I have reviewed the above documentation for accuracy and completeness, and I agree with the above. Karie Chimera, M.D., Ph.D. 05/19/23 12:34 PM   Abbreviations: M myopia (nearsighted); A astigmatism; H hyperopia (farsighted); P presbyopia; Mrx spectacle  prescription;  CTL contact lenses; OD right eye; OS left eye; OU both eyes  XT exotropia; ET esotropia; PEK punctate epithelial keratitis; PEE punctate epithelial erosions; DES dry eye syndrome; MGD meibomian gland dysfunction; ATs artificial tears; PFAT's preservative free artificial tears; NSC nuclear sclerotic cataract; PSC posterior subcapsular cataract; ERM epi-retinal membrane; PVD posterior vitreous detachment; RD retinal detachment; DM diabetes mellitus; DR diabetic retinopathy; NPDR non-proliferative diabetic retinopathy; PDR proliferative diabetic retinopathy; CSME clinically significant macular edema; DME diabetic macular edema; dbh dot blot hemorrhages; CWS cotton wool spot; POAG primary open angle glaucoma; C/D cup-to-disc ratio; HVF humphrey visual field; GVF goldmann visual field; OCT optical coherence tomography; IOP intraocular pressure; BRVO Branch retinal vein occlusion; CRVO central retinal vein occlusion; CRAO central retinal artery occlusion; BRAO branch retinal artery occlusion; RT retinal tear; SB scleral buckle; PPV pars plana vitrectomy; VH Vitreous hemorrhage; PRP panretinal laser photocoagulation; IVK intravitreal kenalog; VMT vitreomacular traction; MH Macular hole;  NVD neovascularization of the disc; NVE neovascularization elsewhere; AREDS age related eye disease study; ARMD age related macular degeneration; POAG primary open angle glaucoma; EBMD epithelial/anterior basement membrane dystrophy; ACIOL anterior chamber intraocular lens; IOL intraocular lens; PCIOL posterior chamber intraocular lens; Phaco/IOL phacoemulsification with intraocular lens placement; PRK photorefractive keratectomy; LASIK laser assisted in situ keratomileusis; HTN hypertension; DM diabetes mellitus; COPD chronic obstructive pulmonary disease

## 2023-05-19 ENCOUNTER — Encounter (INDEPENDENT_AMBULATORY_CARE_PROVIDER_SITE_OTHER): Payer: Self-pay | Admitting: Ophthalmology

## 2023-05-19 ENCOUNTER — Ambulatory Visit (INDEPENDENT_AMBULATORY_CARE_PROVIDER_SITE_OTHER): Payer: Medicare Other | Admitting: Ophthalmology

## 2023-05-19 DIAGNOSIS — H34832 Tributary (branch) retinal vein occlusion, left eye, with macular edema: Secondary | ICD-10-CM

## 2023-05-19 DIAGNOSIS — I10 Essential (primary) hypertension: Secondary | ICD-10-CM | POA: Diagnosis not present

## 2023-05-19 DIAGNOSIS — H35033 Hypertensive retinopathy, bilateral: Secondary | ICD-10-CM

## 2023-05-19 DIAGNOSIS — Z961 Presence of intraocular lens: Secondary | ICD-10-CM | POA: Diagnosis not present

## 2023-05-19 MED ORDER — FARICIMAB-SVOA 6 MG/0.05ML IZ SOSY
6.0000 mg | PREFILLED_SYRINGE | INTRAVITREAL | Status: AC | PRN
  Administered 2023-05-19: 6 mg via INTRAVITREAL

## 2023-06-30 ENCOUNTER — Encounter (INDEPENDENT_AMBULATORY_CARE_PROVIDER_SITE_OTHER): Payer: Medicare Other | Admitting: Ophthalmology

## 2023-06-30 DIAGNOSIS — H35033 Hypertensive retinopathy, bilateral: Secondary | ICD-10-CM

## 2023-06-30 DIAGNOSIS — I10 Essential (primary) hypertension: Secondary | ICD-10-CM

## 2023-06-30 DIAGNOSIS — Z961 Presence of intraocular lens: Secondary | ICD-10-CM

## 2023-06-30 DIAGNOSIS — H34832 Tributary (branch) retinal vein occlusion, left eye, with macular edema: Secondary | ICD-10-CM

## 2023-07-02 ENCOUNTER — Encounter (INDEPENDENT_AMBULATORY_CARE_PROVIDER_SITE_OTHER): Payer: Self-pay | Admitting: Ophthalmology

## 2023-07-02 ENCOUNTER — Ambulatory Visit (INDEPENDENT_AMBULATORY_CARE_PROVIDER_SITE_OTHER): Admitting: Ophthalmology

## 2023-07-02 DIAGNOSIS — I10 Essential (primary) hypertension: Secondary | ICD-10-CM | POA: Diagnosis not present

## 2023-07-02 DIAGNOSIS — Z961 Presence of intraocular lens: Secondary | ICD-10-CM | POA: Diagnosis not present

## 2023-07-02 DIAGNOSIS — H34832 Tributary (branch) retinal vein occlusion, left eye, with macular edema: Secondary | ICD-10-CM

## 2023-07-02 DIAGNOSIS — H35033 Hypertensive retinopathy, bilateral: Secondary | ICD-10-CM | POA: Diagnosis not present

## 2023-07-02 MED ORDER — FARICIMAB-SVOA 6 MG/0.05ML IZ SOSY
6.0000 mg | PREFILLED_SYRINGE | INTRAVITREAL | Status: AC | PRN
  Administered 2023-07-02: 6 mg via INTRAVITREAL

## 2023-07-02 NOTE — Progress Notes (Signed)
 [facility] - Clinic Note  07/02/2023    CHIEF COMPLAINT Patient presents for Retina Follow Up  HISTORY OF PRESENT ILLNESS: Glenn Hunt. is a 89 y.o. male who presents to the clinic today for:   HPI     Retina Follow Up   Patient presents with  CRVO/BRVO.  In left eye.  Severity is moderate.  Duration of 6 weeks.  Since onset it is stable.  I, the attending physician,  performed the HPI with the patient and updated documentation appropriately.        Comments   6 week Retina eval. Patient states vision is good has no trouble reading      Last edited by Rennis Chris, MD on 07/02/2023 10:21 AM.    Pt states his vision is "fine", he states he fell recently, but is working with a trainer  Referring physician: Sinda Du, MD [address] [place],  [place] [address]  HISTORICAL INFORMATION:   Selected notes from the MEDICAL RECORD NUMBER Referred by Dr. Cathey Endow for HRVO LEE:  Ocular Hx- PMH-    CURRENT MEDICATIONS: No current outpatient medications on file. (Ophthalmic Drugs)   No current facility-administered medications for this visit. (Ophthalmic Drugs)   Current Outpatient Medications (Other)  Medication Sig   acetaminophen (TYLENOL) 500 MG tablet Take 1,000 mg by mouth every 8 (eight) hours as needed for moderate pain.   allopurinol (ZYLOPRIM) 300 MG tablet Take 300 mg by mouth daily.    cephALEXin (KEFLEX) 500 MG capsule Take 1 capsule (500 mg total) by mouth 3 (three) times daily.   ibuprofen (ADVIL) 200 MG tablet Take 600 mg by mouth every 8 (eight) hours as needed for moderate pain.   Multiple Vitamins-Minerals (MULTIVITAMIN ADULT PO) Take 1 tablet by mouth daily.   ondansetron (ZOFRAN ODT) 4 MG disintegrating tablet Take 1 tablet (4 mg total) by mouth every 8 (eight) hours as needed for nausea or vomiting.   potassium chloride (KLOR-CON) 10 MEQ tablet Take 10 mEq by mouth 2 (two) times daily.   torsemide (DEMADEX) 10 MG tablet Take 5  mg by mouth daily as needed (fluid).   triamterene-hydrochlorothiazide (MAXZIDE-25) 37.5-25 MG tablet Take 1 tablet by mouth daily.   No current facility-administered medications for this visit. (Other)   REVIEW OF SYSTEMS: ROS   Positive for: Musculoskeletal, Eyes Negative for: Constitutional, Gastrointestinal, Neurological, Skin, Genitourinary, HENT, Endocrine, Cardiovascular, Respiratory, Psychiatric, Allergic/Imm, Heme/Lymph Last edited by Lana Fish, COT on 07/02/2023  9:16 AM.     ALLERGIES Allergies  Allergen Reactions   Famotidine Other (See Comments)   Tetracycline Hcl Other (See Comments)   Tetracyclines & Related Other (See Comments)    Infection of penis   PAST MEDICAL HISTORY Past Medical History:  Diagnosis Date   Anemia    Arthritis    CAD (coronary artery disease)    2007, inferior ischemia on nuclear scan, catheterization showed  60-70% ostial PDA, 90% distal RCA and a very large right coronary artery ( vision stent was placed 3.5 x 23 mm), 80-90% diagonal ( small vessel, wire could not be passed patient followed medically)   CAD (coronary artery disease)    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; 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 but considers himslef stable , he denies chest pain , headache, sob  , reports he does "heavy exercise " states " i work out with a trainer 2-3 times a week and  go walking but i havent done much since the gyms closed "    Cancer of kidney (HCC)    Creatinine elevation    Dieulafoy lesion of stomach    incidence of bleeds in 2015 and 2017 , required blood transfusion as bleed lef to a 4.5 hemoglobin   Gout    H/O right nephrectomy    History of blood transfusion    Hypertension    Skin cancer    skin , squamous cell    Tuberculosis 1962   Varicose veins of legs    bialtera; "ive had them about 5 years now and theyve never been a problem"    Past Surgical History:  Procedure Laterality Date   CATARACT  EXTRACTION, BILATERAL  2017   COLONOSCOPY     CORONARY ANGIOPLASTY WITH STENT PLACEMENT  2006   ESOPHAGOGASTRODUODENOSCOPY  04/20/2012   Procedure: ESOPHAGOGASTRODUODENOSCOPY (EGD);  Surgeon: Graylin Shiver, MD;  Location: [facility] ENDOSCOPY;  Service: Endoscopy;  Laterality: N/A;   NEPHRECTOMY Right    REPORTS AVG CREATININE 1.4 FOR THE LAST 20 YEARS    REVERSE SHOULDER ARTHROPLASTY Right 02/14/2020   Procedure: REVERSE SHOULDER ARTHROPLASTY;  Surgeon: Yolonda Kida, MD;  Location: [facility] ORS;  Service: Orthopedics;  Laterality: Right;  2.5 hrs   TOTAL KNEE ARTHROPLASTY Right 08/26/2018   Procedure: TOTAL KNEE ARTHROPLASTY;  Surgeon: Durene Romans, MD;  Location: [facility] ORS;  Service: Orthopedics;  Laterality: Right;  70 mins   FAMILY HISTORY History reviewed. No pertinent family history.  SOCIAL HISTORY Social History   Tobacco Use   Smoking status: Former    Current packs/day: 0.00    Types: Cigarettes    Quit date: 04/01/2021    Years since quitting: 2.2   Smokeless tobacco: Never   Tobacco comments:    7 cigarettes a day fo the last 10 years   Vaping Use   Vaping status: Never Used  Substance Use Topics   Alcohol use: Yes    Alcohol/week: 7.0 standard drinks of alcohol    Types: 7 Glasses of wine per week    Comment: daily    Drug use: No       OPHTHALMIC EXAM:  Base Eye Exam     Visual Acuity (Snellen - Linear)       Right Left   Dist cc 20/20 20/25 -3   Dist ph cc  20/NI    Correction: Glasses         Tonometry (Tonopen, 9:24 AM)       Right Left   Pressure 14 10         Pupils       Dark Light Shape React APD   Right 3 2 Round Brisk None   Left 3 2 Round Brisk None         Visual Fields (Counting fingers)       Left Right    Full Full         Extraocular Movement       Right Left    Full, Ortho Full, Ortho         Neuro/Psych     Oriented x3: Yes   Mood/Affect: Normal         Dilation     Both eyes: 1.0% Mydriacyl, 2.5%  Phenylephrine @ 9:25 AM           Slit Lamp and Fundus Exam     Slit Lamp Exam       Right Left  Lids/Lashes Dermatochalasis - upper lid Dermatochalasis - upper lid   Conjunctiva/Sclera Mild conjunctivochalasis Mild conjunctivochalasis   Cornea 1+ Punctate epithelial erosions 1+ Punctate epithelial erosions   Anterior Chamber deep and clear deep and clear   Iris Round and dilated Round and dilated   Lens Toric PC IOL in good position with marks at 0300 and 0900 Toric PC IOL in good position with marks at 0300 and 0900   Anterior Vitreous Vitreous syneresis, PVD, vit condensations Vitreous syneresis, Posterior vitreous detachment         Fundus Exam       Right Left   Disc mild Pallor, Sharp rim mild pallor, sharp rim, +hyperemia -- improved, mild vascular loops nasal disc   C/D Ratio 0.7 0.6   Macula Flat, good foveal reflex, RPE mottling and clumping, No heme or edema Blunted foveal reflex, confluent IRH superior macula -- vastly improved, now rare MA greatest temporally, central cystic changes -- persistent   Vessels attenuated, Tortuous attenuated, Tortuous, superior HRVO   Periphery Attached, No heme Attached, superior DBH -- improved, pigmented paving stone degeneration inferiorly           Refraction     Wearing Rx       Sphere Cylinder Axis Add   Right -1.00 +0.75 014 +2.75   Left -1.00 +1.50 148 +2.75           IMAGING AND PROCEDURES  Imaging and Procedures for 07/02/2023  OCT, Retina - OU - Both Eyes       Right Eye Quality was good. Central Foveal Thickness: 262. Progression has been stable. Findings include normal foveal contour, no IRF, no SRF, myopic contour, retinal drusen .   Left Eye Quality was good. Central Foveal Thickness: 289. Progression has been stable. Findings include no SRF, abnormal foveal contour, myopic contour, intraretinal hyper-reflective material, intraretinal fluid (Persistent cystic changes temporal fovea -- slightly  improved; stable improvement in superior macula).   Notes *Images captured and stored on drive  Diagnosis / Impression:  OD: NFP, no IRF/SRF OS: Persistent cystic changes temporal fovea -- slightly improved; stable improvement in superior macula  Clinical management:  See below  Abbreviations: NFP - Normal foveal profile. CME - cystoid macular edema. PED - pigment epithelial detachment. IRF - intraretinal fluid. SRF - subretinal fluid. EZ - ellipsoid zone. ERM - epiretinal membrane. ORA - outer retinal atrophy. ORT - outer retinal tubulation. SRHM - subretinal hyper-reflective material. IRHM - intraretinal hyper-reflective material      Intravitreal Injection, Pharmacologic Agent - OS - Left Eye       Time Out 07/02/2023. 10:27 AM. Confirmed correct patient, procedure, site, and patient consented.   Anesthesia Topical anesthesia was used. Anesthetic medications included Lidocaine 2%, Proparacaine 0.5%.   Procedure Preparation included 5% betadine to ocular surface, eyelid speculum. A (32g) needle was used.   Injection: 6 mg faricimab-svoa 6 MG/0.05ML   Route: Intravitreal, Site: Left Eye   NDC: 16109-604-54, Lot: [id number], Expiration date: 07/29/2023, Waste: 0 mL   Post-op Post injection exam found visual acuity of at least counting fingers. The patient tolerated the procedure well. There were no complications. The patient received written and verbal post procedure care education. Post injection medications were not given.            ASSESSMENT/PLAN:    ICD-10-CM   1. Branch retinal vein occlusion of left eye with macular edema  H34.8320 OCT, Retina - OU - Both Eyes    Intravitreal Injection, Pharmacologic  Agent - OS - Left Eye    faricimab-svoa (VABYSMO) 6mg /0.35mL intravitreal injection    2. Essential hypertension  I10     3. Hypertensive retinopathy of both eyes  H35.033     4. Pseudophakia of both eyes  Z96.1      Superior HRVO w/ CME OS - s/p IVA OS #1  (10.09.23), #2 (11.06.23), #3 (12.04.23), #4 (01.02.24), #5 (01.30.24) -- IVA resistance ===================== - s/p IVE OS #1 (02.28.24), #2 (03.27.24), #3 (04.24.24), #4 (05.22.24), #5 (06.20.24), #6 (07.19.24) -- IVE resistance ===================== - s/p IVV OS #1 (08.21.24), #2 (09.18.24), #3 (10.21.24), #4 (11.19.24), #5 (12.17.24), #6 (sample 01.14.25), #7 (02.18.25) - BCVA OS 20/30 - stable - exam shows continued improvement in diffuse retinal hemorrhages -- essentially resolved - OCT shows Persistent cystic changes temporal fovea -- slightly improved; stable improvement in superior macula at 6 weeks - recommend IVV OS #8 today, 04.03.25 w/ f/u in 6 wks - RBA of procedure discussed, questions answered - Vabysmo informed consent obtained and signed OS 08.21.24 - see procedure note - [employer] prior auth obtained for 2025, Vabysmo approved for 2025, but [employer] funding unavailable - pt covering 20% coinsurance on medication - F/U in 6 weeks -- DFE/OCT/possible injection  2,3. Hypertensive retinopathy OU - discussed importance of tight BP control - continue to monitor  4. Pseudophakia OU  - s/p CE/toric IOL OU  - IOLs in good position, doing well  - continue to monitor  Ophthalmic Meds Ordered this visit:  Meds ordered this encounter  Medications   faricimab-svoa (VABYSMO) 6mg /0.55mL intravitreal injection     Return in about 6 weeks (around 08/13/2023) for f/u BRVO OS, DFE, OCT, Possible Injxn.  There are no Patient Instructions on file for this visit.  This document serves as a record of services personally performed by Karie Chimera, MD, PhD. It was created on their behalf by Glee Arvin. Manson Passey, OA an ophthalmic technician. The creation of this record is the provider's dictation and/or activities during the visit.    Electronically signed by: Glee Arvin. Manson Passey, OA 07/02/23 12:02 PM  Karie Chimera, M.D., Ph.D. Diseases & Surgery of the Retina and Vitreous [facility]  I have reviewed the above documentation for accuracy and completeness, and I agree with the above. Karie Chimera, M.D., Ph.D. 07/02/23 12:03 PM   Abbreviations: M myopia (nearsighted); A astigmatism; H hyperopia (farsighted); P presbyopia; Mrx spectacle prescription;  CTL contact lenses; OD right eye; OS left eye; OU both eyes  XT exotropia; ET esotropia; PEK punctate epithelial keratitis; PEE punctate epithelial erosions; DES dry eye syndrome; MGD meibomian gland dysfunction; ATs artificial tears; PFAT's preservative free artificial tears; NSC nuclear sclerotic cataract; PSC posterior subcapsular cataract; ERM epi-retinal membrane; PVD posterior vitreous detachment; RD retinal detachment; DM diabetes mellitus; DR diabetic retinopathy; NPDR non-proliferative diabetic retinopathy; PDR proliferative diabetic retinopathy; CSME clinically significant macular edema; DME diabetic macular edema; dbh dot blot hemorrhages; CWS cotton wool spot; POAG primary open angle glaucoma; C/D cup-to-disc ratio; HVF humphrey visual field; GVF goldmann visual field; OCT optical coherence tomography; IOP intraocular pressure; BRVO Branch retinal vein occlusion; CRVO central retinal vein occlusion; CRAO central retinal artery occlusion; BRAO branch retinal artery occlusion; RT retinal tear; SB scleral buckle; PPV pars plana vitrectomy; VH Vitreous hemorrhage; PRP panretinal laser photocoagulation; IVK intravitreal kenalog; VMT vitreomacular traction; MH Macular hole;  NVD neovascularization of the disc; NVE neovascularization elsewhere; AREDS age related eye disease study; ARMD age related  macular degeneration; POAG primary open angle glaucoma; EBMD epithelial/anterior basement membrane dystrophy; ACIOL anterior chamber intraocular lens; IOL intraocular lens; PCIOL posterior chamber intraocular lens; Phaco/IOL phacoemulsification with intraocular lens placement; PRK photorefractive keratectomy; LASIK laser  assisted in situ keratomileusis; HTN hypertension; DM diabetes mellitus; COPD chronic obstructive pulmonary disease

## 2023-07-29 NOTE — Progress Notes (Signed)
 [facility] - Clinic Note  08/11/2023    CHIEF COMPLAINT Patient presents for Retina Follow Up  HISTORY OF PRESENT ILLNESS: Glenn Hunt. is a 89 y.o. male who presents to the clinic today for:   HPI     Retina Follow Up   Patient presents with  CRVO/BRVO.  In left eye.  This started 6 weeks ago.  Duration of 6 weeks.  Since onset it is stable.        Comments   6 week retina follow up BRVO OS pt is reporting vision is about the same he denies any flashes has some floaters       Last edited by Alise Appl, COT on 08/11/2023  9:05 AM.     Patient states that some days he can't see as well.   Referring physician: Cindra Cree, MD [address] [place],  [place] [address]  HISTORICAL INFORMATION:   Selected notes from the MEDICAL RECORD NUMBER Referred by Dr. Ambrosio Junker for HRVO LEE:  Ocular Hx- PMH-    CURRENT MEDICATIONS: No current outpatient medications on file. (Ophthalmic Drugs)   No current facility-administered medications for this visit. (Ophthalmic Drugs)   Current Outpatient Medications (Other)  Medication Sig   acetaminophen  (TYLENOL ) 500 MG tablet Take 1,000 mg by mouth every 8 (eight) hours as needed for moderate pain.   allopurinol  (ZYLOPRIM ) 300 MG tablet Take 300 mg by mouth daily.    cephALEXin  (KEFLEX ) 500 MG capsule Take 1 capsule (500 mg total) by mouth 3 (three) times daily.   ibuprofen (ADVIL) 200 MG tablet Take 600 mg by mouth every 8 (eight) hours as needed for moderate pain.   Multiple Vitamins-Minerals (MULTIVITAMIN ADULT PO) Take 1 tablet by mouth daily.   ondansetron  (ZOFRAN  ODT) 4 MG disintegrating tablet Take 1 tablet (4 mg total) by mouth every 8 (eight) hours as needed for nausea or vomiting.   potassium chloride  (KLOR-CON ) 10 MEQ tablet Take 10 mEq by mouth 2 (two) times daily.   torsemide  (DEMADEX ) 10 MG tablet Take 5 mg by mouth daily as needed (fluid).   triamterene -hydrochlorothiazide  (MAXZIDE -25)  37.5-25 MG tablet Take 1 tablet by mouth daily.   No current facility-administered medications for this visit. (Other)   REVIEW OF SYSTEMS: ROS   Positive for: Musculoskeletal, Eyes Negative for: Constitutional, Gastrointestinal, Neurological, Skin, Genitourinary, HENT, Endocrine, Cardiovascular, Respiratory, Psychiatric, Allergic/Imm, Heme/Lymph Last edited by Alise Appl, COT on 08/11/2023  9:05 AM.      ALLERGIES Allergies  Allergen Reactions   Famotidine Other (See Comments)   Tetracycline Hcl Other (See Comments)   Tetracyclines & Related Other (See Comments)    Infection of penis   PAST MEDICAL HISTORY Past Medical History:  Diagnosis Date   Anemia    Arthritis    CAD (coronary artery disease)    2007, inferior ischemia on nuclear scan, catheterization showed  60-70% ostial PDA, 90% distal RCA and a very large right coronary artery ( vision stent was placed 3.5 x 23 mm), 80-90% diagonal ( small vessel, wire could not be passed patient followed medically)   CAD (coronary artery disease)    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; 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 but considers himslef stable , he denies chest pain , headache, sob  , reports he does "heavy exercise " states " i work out with a trainer 2-3 times a week and go walking but i havent done  much since the gyms closed "    Cancer of kidney (HCC)    Creatinine elevation    Dieulafoy lesion of stomach    incidence of bleeds in 2015 and 2017 , required blood transfusion as bleed lef to a 4.5 hemoglobin   Gout    H/O right nephrectomy    History of blood transfusion    Hypertension    Skin cancer    skin , squamous cell    Tuberculosis 1962   Varicose veins of legs    bialtera; "ive had them about 5 years now and theyve never been a problem"    Past Surgical History:  Procedure Laterality Date   CATARACT EXTRACTION, BILATERAL  2017   COLONOSCOPY     CORONARY ANGIOPLASTY WITH STENT  PLACEMENT  2006   ESOPHAGOGASTRODUODENOSCOPY  04/20/2012   Procedure: ESOPHAGOGASTRODUODENOSCOPY (EGD);  Surgeon: Celedonio Coil, MD;  Location: [facility] ENDOSCOPY;  Service: Endoscopy;  Laterality: N/A;   NEPHRECTOMY Right    REPORTS AVG CREATININE 1.4 FOR THE LAST 20 YEARS    REVERSE SHOULDER ARTHROPLASTY Right 02/14/2020   Procedure: REVERSE SHOULDER ARTHROPLASTY;  Surgeon: Janeth Medicus, MD;  Location: [facility] ORS;  Service: Orthopedics;  Laterality: Right;  2.5 hrs   TOTAL KNEE ARTHROPLASTY Right 08/26/2018   Procedure: TOTAL KNEE ARTHROPLASTY;  Surgeon: Claiborne Crew, MD;  Location: [facility] ORS;  Service: Orthopedics;  Laterality: Right;  70 mins   FAMILY HISTORY History reviewed. No pertinent family history.  SOCIAL HISTORY Social History   Tobacco Use   Smoking status: Former    Current packs/day: 0.00    Types: Cigarettes    Quit date: 04/01/2021    Years since quitting: 2.3   Smokeless tobacco: Never   Tobacco comments:    7 cigarettes a day fo the last 10 years   Vaping Use   Vaping status: Never Used  Substance Use Topics   Alcohol use: Yes    Alcohol/week: 7.0 standard drinks of alcohol    Types: 7 Glasses of wine per week    Comment: daily    Drug use: No       OPHTHALMIC EXAM:  Base Eye Exam     Visual Acuity (Snellen - Linear)       Right Left   Dist cc 20/20 -1 20/30 -1   Dist ph cc  NI    Correction: Glasses         Tonometry (Tonopen, 9:09 AM)       Right Left   Pressure 18 14         Pupils       Pupils Dark Light Shape React APD   Right PERRL 3 2 Round Brisk None   Left PERRL 3 2 Round Brisk None         Visual Fields       Left Right    Full Full         Extraocular Movement       Right Left    Full, Ortho Full, Ortho         Neuro/Psych     Oriented x3: Yes   Mood/Affect: Normal         Dilation     Both eyes: 2.5% Phenylephrine  @ 9:08 AM           Slit Lamp and Fundus Exam     Slit Lamp Exam        Right Left   Lids/Lashes Dermatochalasis - upper  lid Dermatochalasis - upper lid   Conjunctiva/Sclera Mild conjunctivochalasis Mild conjunctivochalasis   Cornea 1+ Punctate epithelial erosions 1+ Punctate epithelial erosions   Anterior Chamber deep and clear deep and clear   Iris Round and dilated Round and dilated   Lens Toric PC IOL in good position with marks at 0300 and 0900 Toric PC IOL in good position with marks at 0300 and 0900   Anterior Vitreous Vitreous syneresis, PVD, vit condensations Vitreous syneresis, Posterior vitreous detachment         Fundus Exam       Right Left   Disc mild Pallor, Sharp rim mild pallor, sharp rim, +hyperemia -- improved, mild vascular loops nasal disc   C/D Ratio 0.7 0.6   Macula Flat, good foveal reflex, RPE mottling and clumping, No heme or edema Blunted foveal reflex, confluent IRH superior macula -- vastly improved, now rare MA greatest temporally, central cystic changes -- persistent   Vessels attenuated, Tortuous attenuated, Tortuous, superior HRVO   Periphery Attached, No heme Attached, superior DBH -- improved, pigmented paving stone degeneration inferiorly           Refraction     Wearing Rx       Sphere Cylinder Axis Add   Right -1.00 +0.75 014 +2.75   Left -1.00 +1.50 148 +2.75           IMAGING AND PROCEDURES  Imaging and Procedures for 08/11/2023  OCT, Retina - OU - Both Eyes        Right Eye Quality was good. Central Foveal Thickness: 262. Progression has been stable. Findings include normal foveal contour, no IRF, no SRF, myopic contour, retinal drusen .   Left Eye Quality was good. Central Foveal Thickness: 289. Progression has been stable. Findings include no SRF, abnormal foveal contour, myopic contour, intraretinal hyper-reflective material, intraretinal fluid (Persistent cystic changes temporal fovea -- slightly improved; stable improvement in superior macula).   Notes  *Images captured and stored on  drive  Diagnosis / Impression:  OD: NFP, no IRF/SRF OS: Persistent cystic changes temporal fovea -- slightly improved; stable improvement in superior macula  Clinical management:  See below  Abbreviations: NFP - Normal foveal profile. CME - cystoid macular edema. PED - pigment epithelial detachment. IRF - intraretinal fluid. SRF - subretinal fluid. EZ - ellipsoid zone. ERM - epiretinal membrane. ORA - outer retinal atrophy. ORT - outer retinal tubulation. SRHM - subretinal hyper-reflective material. IRHM - intraretinal hyper-reflective material             ASSESSMENT/PLAN:    ICD-10-CM   1. Branch retinal vein occlusion of left eye with macular edema  H34.8320 OCT, Retina - OU - Both Eyes    2. Essential hypertension  I10     3. Hypertensive retinopathy of both eyes  H35.033     4. Pseudophakia of both eyes  Z96.1       Superior HRVO w/ CME OS - s/p IVA OS #1 (10.09.23), #2 (11.06.23), #3 (12.04.23), #4 (01.02.24), #5 (01.30.24) -- IVA resistance ===================== - s/p IVE OS #1 (02.28.24), #2 (03.27.24), #3 (04.24.24), #4 (05.22.24), #5 (06.20.24), #6 (07.19.24) -- IVE resistance ===================== - s/p IVV OS #1 (08.21.24), #2 (09.18.24), #3 (10.21.24), #4 (11.19.24), #5 (12.17.24), #6 (sample 01.14.25), #7 (02.18.25), #8 (04.03.25) - BCVA OS 20/30  - exam shows continued improvement in diffuse retinal hemorrhages -- essentially resolved - OCT shows Persistent cystic changes temporal fovea -- slightly improved; stable improvement in superior macula at 6 weeks -  recommend IVV OS #9 today, 05.13.25 w/ f/u in 6 wks - RBA of procedure discussed, questions answered - Vabysmo  informed consent obtained and signed OS 08.21.24 - see procedure note - [employer]  prior auth obtained for 2025, [employer]  approved for 2025, but [employer] funding unavailable - pt covering 20% coinsurance on medication - F/U in 6 weeks -- DFE/OCT/possible injection  2,3. Hypertensive retinopathy  OU - discussed importance of tight BP control - continue to monitor  4. Pseudophakia OU  - s/p CE/toric IOL OU  - IOLs in good position, doing well  - continue to monitor  Ophthalmic Meds Ordered this visit:  No orders of the defined types were placed in this encounter.    No follow-ups on file.  There are no Patient Instructions on file for this visit. This document serves as a record of services personally performed by Jeanice Millard, MD, PhD. It was created on their behalf by Olene Berne, COT an ophthalmic technician. The creation of this record is the provider's dictation and/or activities during the visit.    Electronically signed by:  Olene Berne, COT  08/11/23 9:52 AM   Jeanice Millard, M.D., Ph.D. Diseases & Surgery of the Retina and Vitreous [facility]  Abbreviations: M myopia (nearsighted); A astigmatism; H hyperopia (farsighted); P presbyopia; Mrx spectacle prescription;  CTL contact lenses; OD right eye; OS left eye; OU both eyes  XT exotropia; ET esotropia; PEK punctate epithelial keratitis; PEE punctate epithelial erosions; DES dry eye syndrome; MGD meibomian gland dysfunction; ATs artificial tears; PFAT's preservative free artificial tears; NSC nuclear sclerotic cataract; PSC posterior subcapsular cataract; ERM epi-retinal membrane; PVD posterior vitreous detachment; RD retinal detachment; DM diabetes mellitus; DR diabetic retinopathy; NPDR non-proliferative diabetic retinopathy; PDR proliferative diabetic retinopathy; CSME clinically significant macular edema; DME diabetic macular edema; dbh dot blot hemorrhages; CWS cotton wool spot; POAG primary open angle glaucoma; C/D cup-to-disc ratio; HVF humphrey visual field; GVF goldmann visual field; OCT optical coherence tomography; IOP intraocular pressure; BRVO Branch retinal vein occlusion; CRVO central retinal vein occlusion; CRAO central retinal artery occlusion; BRAO branch retinal  artery occlusion; RT retinal tear; SB scleral buckle; PPV pars plana vitrectomy; VH Vitreous hemorrhage; PRP panretinal laser photocoagulation; IVK intravitreal kenalog ; VMT vitreomacular traction; MH Macular hole;  NVD neovascularization of the disc; NVE neovascularization elsewhere; AREDS age related eye disease study; ARMD age related macular degeneration; POAG primary open angle glaucoma; EBMD epithelial/anterior basement membrane dystrophy; ACIOL anterior chamber intraocular lens; IOL intraocular lens; PCIOL posterior chamber intraocular lens; Phaco/IOL phacoemulsification with intraocular lens placement; PRK photorefractive keratectomy; LASIK laser assisted in situ keratomileusis; HTN hypertension; DM diabetes mellitus; COPD chronic obstructive pulmonary disease

## 2023-08-11 ENCOUNTER — Ambulatory Visit (INDEPENDENT_AMBULATORY_CARE_PROVIDER_SITE_OTHER): Admitting: Ophthalmology

## 2023-08-11 ENCOUNTER — Encounter (INDEPENDENT_AMBULATORY_CARE_PROVIDER_SITE_OTHER): Payer: Self-pay | Admitting: Ophthalmology

## 2023-08-11 DIAGNOSIS — Z961 Presence of intraocular lens: Secondary | ICD-10-CM

## 2023-08-11 DIAGNOSIS — H34832 Tributary (branch) retinal vein occlusion, left eye, with macular edema: Secondary | ICD-10-CM

## 2023-08-11 DIAGNOSIS — H35033 Hypertensive retinopathy, bilateral: Secondary | ICD-10-CM | POA: Diagnosis not present

## 2023-08-11 DIAGNOSIS — I10 Essential (primary) hypertension: Secondary | ICD-10-CM

## 2023-08-11 MED ORDER — FARICIMAB-SVOA 6 MG/0.05ML IZ SOSY
6.0000 mg | PREFILLED_SYRINGE | INTRAVITREAL | Status: AC | PRN
  Administered 2023-08-11: 6 mg via INTRAVITREAL

## 2023-09-17 NOTE — Progress Notes (Signed)
 [facility] - Clinic Note  09/22/2023    CHIEF COMPLAINT Patient presents for Retina Follow Up  HISTORY OF PRESENT ILLNESS: Glenn Sherk. is a 89 y.o. male who presents to the clinic today for:   HPI     Retina Follow Up   Patient presents with  CRVO/BRVO.  In left eye.  This started 20 months ago.  Duration of 6 weeks.  Since onset it is stable.  I, the attending physician,  performed the HPI with the patient and updated documentation appropriately.        Comments   Pt states no changes in vision. Pt deneis FOL/floaters/pain. Pt states him and his wife recently had covid and he is having some issues with memory.      Last edited by Valdemar Rogue, MD on 09/22/2023 11:43 PM.    Patient states no complaints w/ vision.    Referring physician: Waylan Cain, MD [address] [place],  [place] [address]  HISTORICAL INFORMATION:   Selected notes from the MEDICAL RECORD NUMBER Referred by Dr. Waylan for HRVO LEE:  Ocular Hx- PMH-    CURRENT MEDICATIONS: No current outpatient medications on file. (Ophthalmic Drugs)   No current facility-administered medications for this visit. (Ophthalmic Drugs)   Current Outpatient Medications (Other)  Medication Sig   acetaminophen  (TYLENOL ) 500 MG tablet Take 1,000 mg by mouth every 8 (eight) hours as needed for moderate pain.   allopurinol  (ZYLOPRIM ) 300 MG tablet Take 300 mg by mouth daily.    cephALEXin  (KEFLEX ) 500 MG capsule Take 1 capsule (500 mg total) by mouth 3 (three) times daily.   ibuprofen (ADVIL) 200 MG tablet Take 600 mg by mouth every 8 (eight) hours as needed for moderate pain.   Multiple Vitamins-Minerals (MULTIVITAMIN ADULT PO) Take 1 tablet by mouth daily.   ondansetron  (ZOFRAN  ODT) 4 MG disintegrating tablet Take 1 tablet (4 mg total) by mouth every 8 (eight) hours as needed for nausea or vomiting.   potassium chloride  (KLOR-CON ) 10 MEQ tablet Take 10 mEq by mouth 2 (two) times daily.    torsemide  (DEMADEX ) 10 MG tablet Take 5 mg by mouth daily as needed (fluid).   triamterene -hydrochlorothiazide  (MAXZIDE -25) 37.5-25 MG tablet Take 1 tablet by mouth daily.   No current facility-administered medications for this visit. (Other)   REVIEW OF SYSTEMS: ROS   Positive for: Musculoskeletal, Eyes Negative for: Constitutional, Gastrointestinal, Neurological, Skin, Genitourinary, HENT, Endocrine, Cardiovascular, Respiratory, Psychiatric, Allergic/Imm, Heme/Lymph Last edited by Elnor Avelina RAMAN, COT on 09/22/2023  9:12 AM.       ALLERGIES Allergies  Allergen Reactions   Famotidine Other (See Comments)   Tetracycline Hcl Other (See Comments)   Tetracyclines & Related Other (See Comments)    Infection of penis   PAST MEDICAL HISTORY Past Medical History:  Diagnosis Date   Anemia    Arthritis    CAD (coronary artery disease)    2007, inferior ischemia on nuclear scan, catheterization showed  60-70% ostial PDA, 90% distal RCA and a very large right coronary artery ( vision stent was placed 3.5 x 23 mm), 80-90% diagonal ( small vessel, wire could not be passed patient followed medically)   CAD (coronary artery disease)    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; 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 but considers himslef stable , he denies chest pain , headache, sob  , reports he does heavy exercise  states  i work out  with a trainer 2-3 times a week and go walking but i havent done much since the gyms closed     Cancer of kidney (HCC)    Creatinine elevation    Dieulafoy lesion of stomach    incidence of bleeds in 2015 and 2017 , required blood transfusion as bleed lef to a 4.5 hemoglobin   Gout    H/O right nephrectomy    History of blood transfusion    Hypertension    Skin cancer    skin , squamous cell    Tuberculosis 1962   Varicose veins of legs    bialtera; ive had them about 5 years now and theyve never been a problem    Past Surgical History:   Procedure Laterality Date   CATARACT EXTRACTION, BILATERAL  2017   COLONOSCOPY     CORONARY ANGIOPLASTY WITH STENT PLACEMENT  2006   ESOPHAGOGASTRODUODENOSCOPY  04/20/2012   Procedure: ESOPHAGOGASTRODUODENOSCOPY (EGD);  Surgeon: Lesta JULIANNA Fitz, MD;  Location: [facility] ENDOSCOPY;  Service: Endoscopy;  Laterality: N/A;   NEPHRECTOMY Right    REPORTS AVG CREATININE 1.4 FOR THE LAST 20 YEARS    REVERSE SHOULDER ARTHROPLASTY Right 02/14/2020   Procedure: REVERSE SHOULDER ARTHROPLASTY;  Surgeon: Sharl Selinda Dover, MD;  Location: [facility] ORS;  Service: Orthopedics;  Laterality: Right;  2.5 hrs   TOTAL KNEE ARTHROPLASTY Right 08/26/2018   Procedure: TOTAL KNEE ARTHROPLASTY;  Surgeon: Ernie Cough, MD;  Location: [facility] ORS;  Service: Orthopedics;  Laterality: Right;  70 mins   FAMILY HISTORY History reviewed. No pertinent family history.  SOCIAL HISTORY Social History   Tobacco Use   Smoking status: Former    Current packs/day: 0.00    Types: Cigarettes    Quit date: 04/01/2021    Years since quitting: 2.4   Smokeless tobacco: Never   Tobacco comments:    7 cigarettes a day fo the last 10 years   Vaping Use   Vaping status: Never Used  Substance Use Topics   Alcohol use: Yes    Alcohol/week: 7.0 standard drinks of alcohol    Types: 7 Glasses of wine per week    Comment: daily    Drug use: No       OPHTHALMIC EXAM:  Base Eye Exam     Visual Acuity (Snellen - Linear)       Right Left   Dist [place] 20/20 -1 20/30 +1   Dist cc 20/20 -1 20/30 +1   Dist ph cc  20/25 -1    Correction: Glasses         Tonometry (Tonopen, 9:07 AM)       Right Left   Pressure 14 14         Pupils       Pupils Dark Light Shape React APD   Right PERRL 3 2 Round Brisk None   Left PERRL 3 2 Round Brisk None         Visual Fields       Left Right    Full Full         Extraocular Movement       Right Left    Full, Ortho Full, Ortho         Neuro/Psych     Oriented x3: Yes    Mood/Affect: Normal         Dilation     Both eyes: 1.0% Mydriacyl, 2.5% Phenylephrine  @ 9:08 AM           Slit  Lamp and Fundus Exam     Slit Lamp Exam       Right Left   Lids/Lashes Dermatochalasis - upper lid Dermatochalasis - upper lid   Conjunctiva/Sclera Mild conjunctivochalasis Mild conjunctivochalasis   Cornea 1+ Punctate epithelial erosions 1+ Punctate epithelial erosions   Anterior Chamber deep and clear deep and clear   Iris Round and dilated Round and dilated   Lens Toric PC IOL in good position with marks at 0300 and 0900 Toric PC IOL in good position with marks at 0300 and 0900   Anterior Vitreous Vitreous syneresis, PVD, vit condensations Vitreous syneresis, Posterior vitreous detachment         Fundus Exam       Right Left   Disc mild Pallor, Sharp rim mild pallor, sharp rim, +hyperemia -- improved, mild vascular loops nasal disc   C/D Ratio 0.7 0.6   Macula Flat, good foveal reflex, RPE mottling and clumping, No heme or edema Blunted foveal reflex, confluent IRH superior macula -- vastly improved, now rare MA greatest temporally, central cystic changes -- persistent   Vessels attenuated, Tortuous attenuated, Tortuous, superior HRVO   Periphery Attached, No heme Attached, superior DBH -- improved, pigmented paving stone degeneration inferiorly           Refraction     Wearing Rx       Sphere Cylinder Axis Add   Right -1.00 +0.75 014 +2.75   Left -1.00 +1.50 148 +2.75           IMAGING AND PROCEDURES  Imaging and Procedures for 09/22/2023  OCT, Retina - OU - Both Eyes       Right Eye Quality was good. Central Foveal Thickness: 259. Progression has been stable. Findings include normal foveal contour, no IRF, no SRF, myopic contour, retinal drusen .   Left Eye Quality was good. Central Foveal Thickness: 285. Progression has improved. Findings include no SRF, abnormal foveal contour, myopic contour, intraretinal hyper-reflective material,  intraretinal fluid (Persistent cystic changes temporal fovea -- slightly improved; stable improvement in superior macula).   Notes *Images captured and stored on drive  Diagnosis / Impression:  OD: NFP, no IRF/SRF OS: Persistent cystic changes temporal fovea -- slightly improved; stable improvement in superior macula  Clinical management:  See below  Abbreviations: NFP - Normal foveal profile. CME - cystoid macular edema. PED - pigment epithelial detachment. IRF - intraretinal fluid. SRF - subretinal fluid. EZ - ellipsoid zone. ERM - epiretinal membrane. ORA - outer retinal atrophy. ORT - outer retinal tubulation. SRHM - subretinal hyper-reflective material. IRHM - intraretinal hyper-reflective material      Intravitreal Injection, Pharmacologic Agent - OS - Left Eye       Time Out 09/22/2023. 10:08 AM. Confirmed correct patient, procedure, site, and patient consented.   Anesthesia Topical anesthesia was used. Anesthetic medications included Lidocaine  2%, Proparacaine 0.5%.   Procedure Preparation included 5% betadine  to ocular surface, eyelid speculum. A (32g) needle was used.   Injection: 6 mg faricimab -svoa 6 MG/0.05ML   Route: Intravitreal, Site: Left Eye   NDC: 49757-903-93, Lot: [id number], Expiration date: 08/27/2024, Waste: 0 mL   Post-op Post injection exam found visual acuity of at least counting fingers. The patient tolerated the procedure well. There were no complications. The patient received written and verbal post procedure care education. Post injection medications were not given.             ASSESSMENT/PLAN:    ICD-10-CM   1. Branch retinal vein occlusion  of left eye with macular edema  H34.8320 OCT, Retina - OU - Both Eyes    Intravitreal Injection, Pharmacologic Agent - OS - Left Eye    faricimab -svoa (VABYSMO ) 6mg /0.31mL intravitreal injection    2. Essential hypertension  I10     3. Hypertensive retinopathy of both eyes  H35.033     4.  Pseudophakia of both eyes  Z96.1      Superior HRVO w/ CME OS - s/p IVA OS #1 (10.09.23), #2 (11.06.23), #3 (12.04.23), #4 (01.02.24), #5 (01.30.24) -- IVA resistance ===================== - s/p IVE OS #1 (02.28.24), #2 (03.27.24), #3 (04.24.24), #4 (05.22.24), #5 (06.20.24), #6 (07.19.24) -- IVE resistance ===================== - s/p IVV OS #1 (08.21.24), #2 (09.18.24), #3 (10.21.24), #4 (11.19.24), #5 (12.17.24), #6 (sample 01.14.25), #7 (02.18.25), #8 (04.03.25), #9 (05.13.25) - BCVA OS 20/25 from 20/30 - exam shows continued improvement in diffuse retinal hemorrhages -- essentially resolved - OCT shows Persistent cystic changes temporal fovea -- slightly improved; stable improvement in superior macula at 6 weeks - recommend IVV OS #10 today, 06.24.25 w/ f/u in 6 wks - RBA of procedure discussed, questions answered - Vabysmo  informed consent obtained and signed OS 08.21.24 - see procedure note - [place]  prior auth obtained for 2025, Vabysmo  approved for 2025, but [employer] funding unavailable - pt covering 20% coinsurance on medication - F/U in 6 weeks -- DFE/OCT/possible injection  2,3. Hypertensive retinopathy OU - discussed importance of tight BP control - continue to monitor  4. Pseudophakia OU  - s/p CE/toric IOL OU  - IOLs in good position, doing well  - continue to monitor  Ophthalmic Meds Ordered this visit:  Meds ordered this encounter  Medications   faricimab -svoa (VABYSMO ) 6mg /0.70mL intravitreal injection     Return in about 6 weeks (around 11/03/2023) for BRVO OS, DFE, OCT, Possible Injxn.  There are no Patient Instructions on file for this visit.  This document serves as a record of services personally performed by Glenn JUDITHANN Hans, MD, PhD. It was created on their behalf by Alan PARAS. Delores, OA an ophthalmic technician. The creation of this record is the provider's dictation and/or activities during the visit.    Electronically signed by: Alan PARAS. Delores, OA  09/26/23 12:19 AM  This document serves as a record of services personally performed by Glenn JUDITHANN Hans, MD, PhD. It was created on their behalf by Almetta Pesa, an ophthalmic technician. The creation of this record is the provider's dictation and/or activities during the visit.    Electronically signed by: Almetta Pesa, OA, 09/26/23  12:19 AM   Glenn Hunt, M.D., Ph.D. Diseases & Surgery of the Retina and Vitreous [facility]  I have reviewed the above documentation for accuracy and completeness, and I agree with the above. Glenn Hunt, M.D., Ph.D. 09/26/23 12:21 AM    Abbreviations: M myopia (nearsighted); A astigmatism; H hyperopia (farsighted); P presbyopia; Mrx spectacle prescription;  CTL contact lenses; OD right eye; OS left eye; OU both eyes  XT exotropia; ET esotropia; PEK punctate epithelial keratitis; PEE punctate epithelial erosions; DES dry eye syndrome; MGD meibomian gland dysfunction; ATs artificial tears; PFAT's preservative free artificial tears; NSC nuclear sclerotic cataract; PSC posterior subcapsular cataract; ERM epi-retinal membrane; PVD posterior vitreous detachment; RD retinal detachment; DM diabetes mellitus; DR diabetic retinopathy; NPDR non-proliferative diabetic retinopathy; PDR proliferative diabetic retinopathy; CSME clinically significant macular edema; DME diabetic macular edema; dbh dot blot hemorrhages; CWS cotton wool spot; POAG primary open angle glaucoma;  C/D cup-to-disc ratio; HVF humphrey visual field; GVF goldmann visual field; OCT optical coherence tomography; IOP intraocular pressure; BRVO Branch retinal vein occlusion; CRVO central retinal vein occlusion; CRAO central retinal artery occlusion; BRAO branch retinal artery occlusion; RT retinal tear; SB scleral buckle; PPV pars plana vitrectomy; VH Vitreous hemorrhage; PRP panretinal laser photocoagulation; IVK intravitreal kenalog ; VMT vitreomacular traction; MH Macular  hole;  NVD neovascularization of the disc; NVE neovascularization elsewhere; AREDS age related eye disease study; ARMD age related macular degeneration; POAG primary open angle glaucoma; EBMD epithelial/anterior basement membrane dystrophy; ACIOL anterior chamber intraocular lens; IOL intraocular lens; PCIOL posterior chamber intraocular lens; Phaco/IOL phacoemulsification with intraocular lens placement; PRK photorefractive keratectomy; LASIK laser assisted in situ keratomileusis; HTN hypertension; DM diabetes mellitus; COPD chronic obstructive pulmonary disease

## 2023-09-22 ENCOUNTER — Encounter (INDEPENDENT_AMBULATORY_CARE_PROVIDER_SITE_OTHER): Payer: Self-pay | Admitting: Ophthalmology

## 2023-09-22 ENCOUNTER — Ambulatory Visit (INDEPENDENT_AMBULATORY_CARE_PROVIDER_SITE_OTHER): Admitting: Ophthalmology

## 2023-09-22 DIAGNOSIS — I10 Essential (primary) hypertension: Secondary | ICD-10-CM | POA: Diagnosis not present

## 2023-09-22 DIAGNOSIS — Z961 Presence of intraocular lens: Secondary | ICD-10-CM

## 2023-09-22 DIAGNOSIS — H34832 Tributary (branch) retinal vein occlusion, left eye, with macular edema: Secondary | ICD-10-CM | POA: Diagnosis not present

## 2023-09-22 DIAGNOSIS — H35033 Hypertensive retinopathy, bilateral: Secondary | ICD-10-CM

## 2023-09-22 MED ORDER — FARICIMAB-SVOA 6 MG/0.05ML IZ SOSY
6.0000 mg | PREFILLED_SYRINGE | INTRAVITREAL | Status: AC | PRN
  Administered 2023-09-22: 6 mg via INTRAVITREAL

## 2023-10-26 NOTE — Progress Notes (Signed)
 [facility] - Clinic Note  11/03/2023    CHIEF COMPLAINT Patient presents for Retina Follow Up  HISTORY OF PRESENT ILLNESS: Glenn Hunt. is a 89 y.o. male who presents to the clinic today for:   HPI     Retina Follow Up   Patient presents with  CRVO/BRVO.  In left eye.  This started 6 weeks ago.  I, the attending physician,  performed the HPI with the patient and updated documentation appropriately.        Comments   Patient here for 6 weeks retina follow up for BRVO OS. Patient states vision alright. No eye pain.       Last edited by Alphonsa Brickle, MD on 11/03/2023  8:43 PM.     Patient states he broke one his front teeth off while asleep and has to have an implant   Referring physician: Waylan Cain, MD [address] [place],  [place] [address]  HISTORICAL INFORMATION:   Selected notes from the MEDICAL RECORD NUMBER Referred by Dr. Waylan for HRVO LEE:  Ocular Hx- PMH-    CURRENT MEDICATIONS: No current outpatient medications on file. (Ophthalmic Drugs)   No current facility-administered medications for this visit. (Ophthalmic Drugs)   Current Outpatient Medications (Other)  Medication Sig   acetaminophen  (TYLENOL ) 500 MG tablet Take 1,000 mg by mouth every 8 (eight) hours as needed for moderate pain.   allopurinol  (ZYLOPRIM ) 300 MG tablet Take 300 mg by mouth daily.    cephALEXin  (KEFLEX ) 500 MG capsule Take 1 capsule (500 mg total) by mouth 3 (three) times daily.   ibuprofen (ADVIL) 200 MG tablet Take 600 mg by mouth every 8 (eight) hours as needed for moderate pain.   Multiple Vitamins-Minerals (MULTIVITAMIN ADULT PO) Take 1 tablet by mouth daily.   ondansetron  (ZOFRAN  ODT) 4 MG disintegrating tablet Take 1 tablet (4 mg total) by mouth every 8 (eight) hours as needed for nausea or vomiting.   potassium chloride  (KLOR-CON ) 10 MEQ tablet Take 10 mEq by mouth 2 (two) times daily.   torsemide  (DEMADEX ) 10 MG tablet Take 5 mg by mouth  daily as needed (fluid).   triamterene -hydrochlorothiazide  (MAXZIDE -25) 37.5-25 MG tablet Take 1 tablet by mouth daily.   No current facility-administered medications for this visit. (Other)   REVIEW OF SYSTEMS: ROS   Positive for: Musculoskeletal, Eyes Negative for: Constitutional, Gastrointestinal, Neurological, Skin, Genitourinary, HENT, Endocrine, Cardiovascular, Respiratory, Psychiatric, Allergic/Imm, Heme/Lymph Last edited by Orval Asberry RAMAN, COA on 11/03/2023  9:23 AM.        ALLERGIES Allergies  Allergen Reactions   Famotidine Other (See Comments)   Tetracycline Hcl Other (See Comments)   Tetracyclines & Related Other (See Comments)    Infection of penis   PAST MEDICAL HISTORY Past Medical History:  Diagnosis Date   Anemia    Arthritis    CAD (coronary artery disease)    2007, inferior ischemia on nuclear scan, catheterization showed  60-70% ostial PDA, 90% distal RCA and a very large right coronary artery ( vision stent was placed 3.5 x 23 mm), 80-90% diagonal ( small vessel, wire could not be passed patient followed medically)   CAD (coronary artery disease)    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; 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 but considers himslef stable , he denies chest pain , headache, sob  , reports he does heavy exercise  states  i work out with a trainer 2-3 times  a week and go walking but i havent done much since the gyms closed     Cancer of kidney (HCC)    Creatinine elevation    Dieulafoy lesion of stomach    incidence of bleeds in 2015 and 2017 , required blood transfusion as bleed lef to a 4.5 hemoglobin   Gout    H/O right nephrectomy    History of blood transfusion    Hypertension    Skin cancer    skin , squamous cell    Tuberculosis 1962   Varicose veins of legs    bialtera; ive had them about 5 years now and theyve never been a problem    Past Surgical History:  Procedure Laterality Date   CATARACT EXTRACTION,  BILATERAL  2017   COLONOSCOPY     CORONARY ANGIOPLASTY WITH STENT PLACEMENT  2006   ESOPHAGOGASTRODUODENOSCOPY  04/20/2012   Procedure: ESOPHAGOGASTRODUODENOSCOPY (EGD);  Surgeon: Lesta JULIANNA Fitz, MD;  Location: [facility] ENDOSCOPY;  Service: Endoscopy;  Laterality: N/A;   NEPHRECTOMY Right    REPORTS AVG CREATININE 1.4 FOR THE LAST 20 YEARS    REVERSE SHOULDER ARTHROPLASTY Right 02/14/2020   Procedure: REVERSE SHOULDER ARTHROPLASTY;  Surgeon: Sharl Selinda Dover, MD;  Location: [facility] ORS;  Service: Orthopedics;  Laterality: Right;  2.5 hrs   TOTAL KNEE ARTHROPLASTY Right 08/26/2018   Procedure: TOTAL KNEE ARTHROPLASTY;  Surgeon: Ernie Cough, MD;  Location: [facility] ORS;  Service: Orthopedics;  Laterality: Right;  70 mins   FAMILY HISTORY History reviewed. No pertinent family history.  SOCIAL HISTORY Social History   Tobacco Use   Smoking status: Former    Current packs/day: 0.00    Types: Cigarettes    Quit date: 04/01/2021    Years since quitting: 2.5   Smokeless tobacco: Never   Tobacco comments:    7 cigarettes a day fo the last 10 years   Vaping Use   Vaping status: Never Used  Substance Use Topics   Alcohol use: Yes    Alcohol/week: 7.0 standard drinks of alcohol    Types: 7 Glasses of wine per week    Comment: daily    Drug use: No       OPHTHALMIC EXAM:  Base Eye Exam     Visual Acuity (Snellen - Linear)       Right Left   Dist cc 20/20 20/25 -2    Correction: Glasses         Tonometry (Tonopen, 9:22 AM)       Right Left   Pressure 15 12         Pupils       Dark Light Shape React APD   Right 3 2 Round Brisk None   Left 3 2 Round Brisk None         Visual Fields (Counting fingers)       Left Right    Full Full         Extraocular Movement       Right Left    Full, Ortho Full, Ortho         Neuro/Psych     Oriented x3: Yes   Mood/Affect: Normal         Dilation     Both eyes: 1.0% Mydriacyl, 2.5% Phenylephrine  @ 9:22 AM            Slit Lamp and Fundus Exam     Slit Lamp Exam       Right Left   Lids/Lashes Dermatochalasis -  upper lid Dermatochalasis - upper lid   Conjunctiva/Sclera Mild conjunctivochalasis Mild conjunctivochalasis   Cornea 1+ Punctate epithelial erosions 1+ Punctate epithelial erosions   Anterior Chamber deep and clear deep and clear   Iris Round and dilated Round and dilated   Lens Toric PC IOL in good position with marks at 0300 and 0900 Toric PC IOL in good position with marks at 0300 and 0900   Anterior Vitreous Vitreous syneresis, PVD, vit condensations Vitreous syneresis, Posterior vitreous detachment         Fundus Exam       Right Left   Disc mild Pallor, Sharp rim mild pallor, sharp rim, +hyperemia -- improved, mild vascular loops nasal disc   C/D Ratio 0.7 0.6   Macula Flat, good foveal reflex, RPE mottling and clumping, No heme or edema Blunted foveal reflex, IRH superior macula -- stably improved, now rare MA greatest temporally, central cystic changes -- persistent   Vessels attenuated, Tortuous attenuated, Tortuous, superior HRVO   Periphery Attached, No heme Attached, superior DBH -- improved, pigmented paving stone degeneration inferiorly           Refraction     Wearing Rx       Sphere Cylinder Axis Add   Right -1.00 +0.75 014 +2.75   Left -1.00 +1.50 148 +2.75           IMAGING AND PROCEDURES  Imaging and Procedures for 11/03/2023  OCT, Retina - OU - Both Eyes       Right Eye Quality was good. Central Foveal Thickness: 264. Progression has been stable. Findings include normal foveal contour, no IRF, no SRF, myopic contour, retinal drusen .   Left Eye Quality was good. Central Foveal Thickness: 280. Progression has improved. Findings include no SRF, abnormal foveal contour, myopic contour, intraretinal hyper-reflective material, intraretinal fluid (Interval improvement in cystic changes temporal fovea; stable improvement in superior macula).    Notes *Images captured and stored on drive  Diagnosis / Impression:  OD: NFP, no IRF/SRF OS: Interval improvement in cystic changes temporal fovea; stable improvement in superior macula  Clinical management:  See below  Abbreviations: NFP - Normal foveal profile. CME - cystoid macular edema. PED - pigment epithelial detachment. IRF - intraretinal fluid. SRF - subretinal fluid. EZ - ellipsoid zone. ERM - epiretinal membrane. ORA - outer retinal atrophy. ORT - outer retinal tubulation. SRHM - subretinal hyper-reflective material. IRHM - intraretinal hyper-reflective material      Intravitreal Injection, Pharmacologic Agent - OS - Left Eye       Time Out 11/03/2023. 10:02 AM. Confirmed correct patient, procedure, site, and patient consented.   Anesthesia Topical anesthesia was used. Anesthetic medications included Lidocaine  2%, Proparacaine 0.5%.   Procedure Preparation included 5% betadine  to ocular surface, eyelid speculum. A (32g) needle was used.   Injection: 6 mg faricimab -svoa 6 MG/0.05ML   Route: Intravitreal, Site: Left Eye   NDC: 49757-903-93, Lot: [id number], Expiration date: 10/27/2024, Waste: 0 mL   Post-op Post injection exam found visual acuity of at least counting fingers. The patient tolerated the procedure well. There were no complications. The patient received written and verbal post procedure care education. Post injection medications were not given.            ASSESSMENT/PLAN:    ICD-10-CM   1. Branch retinal vein occlusion of left eye with macular edema  H34.8320 OCT, Retina - OU - Both Eyes    Intravitreal Injection, Pharmacologic Agent - OS - Left Eye  faricimab -svoa (VABYSMO ) 6mg /0.21mL intravitreal injection    2. Essential hypertension  I10     3. Hypertensive retinopathy of both eyes  H35.033     4. Pseudophakia of both eyes  Z96.1      Superior HRVO w/ CME OS - s/p IVA OS #1 (10.09.23), #2 (11.06.23), #3 (12.04.23), #4 (01.02.24), #5  (01.30.24) -- IVA resistance ===================== - s/p IVE OS #1 (02.28.24), #2 (03.27.24), #3 (04.24.24), #4 (05.22.24), #5 (06.20.24), #6 (07.19.24) -- IVE resistance ===================== - s/p IVV OS #1 (08.21.24), #2 (09.18.24), #3 (10.21.24), #4 (11.19.24), #5 (12.17.24), #6 (sample 01.14.25), #7 (02.18.25), #8 (04.03.25), #9 (05.13.25), #10 (06.24.25) - BCVA OS stable at 20/25  - exam shows continued improvement in diffuse retinal hemorrhages -- essentially resolved - OCT shows  Interval improvement in cystic changes temporal fovea; stable improvement in superior macula at 6 weeks - recommend IVV OS #11 today, 08.05.25 w/ f/u in 6 wks again - RBA of procedure discussed, questions answered - Vabysmo  informed consent obtained and signed OS 08.21.24 - see procedure note - Eylea  prior auth obtained for 2025, Vabysmo  approved for 2025, but [employer] funding unavailable - pt covering 20% coinsurance on medication - F/U in 6 weeks -- DFE/OCT/possible injection  2,3. Hypertensive retinopathy OU - discussed importance of tight BP control - continue to monitor  4. Pseudophakia OU  - s/p CE/toric IOL OU  - IOLs in good position, doing well  - continue to monitor  Ophthalmic Meds Ordered this visit:  Meds ordered this encounter  Medications   faricimab -svoa (VABYSMO ) 6mg /0.30mL intravitreal injection     Return in about 6 weeks (around 12/15/2023) for f/u BRVO OS, DFE, OCT, Possible Injxn.  There are no Patient Instructions on file for this visit.  This document serves as a record of services personally performed by Redell JUDITHANN Hans, MD, PhD. It was created on their behalf by Avelina Pereyra, COA an ophthalmic technician. The creation of this record is the provider's dictation and/or activities during the visit.   Electronically signed by: Avelina GORMAN Pereyra, COT  11/03/23  8:45 PM   This document serves as a record of services personally performed by Redell JUDITHANN Hans, MD, PhD. It was  created on their behalf by Alan PARAS. Delores, OA an ophthalmic technician. The creation of this record is the provider's dictation and/or activities during the visit.    Electronically signed by: Alan PARAS. Delores, OA 11/03/23 8:45 PM   Redell JUDITHANN Hans, M.D., Ph.D. Diseases & Surgery of the Retina and Vitreous [facility]  I have reviewed the above documentation for accuracy and completeness, and I agree with the above. Redell JUDITHANN Hans, M.D., Ph.D. 11/03/23 8:46 PM   Abbreviations: M myopia (nearsighted); A astigmatism; H hyperopia (farsighted); P presbyopia; Mrx spectacle prescription;  CTL contact lenses; OD right eye; OS left eye; OU both eyes  XT exotropia; ET esotropia; PEK punctate epithelial keratitis; PEE punctate epithelial erosions; DES dry eye syndrome; MGD meibomian gland dysfunction; ATs artificial tears; PFAT's preservative free artificial tears; NSC nuclear sclerotic cataract; PSC posterior subcapsular cataract; ERM epi-retinal membrane; PVD posterior vitreous detachment; RD retinal detachment; DM diabetes mellitus; DR diabetic retinopathy; NPDR non-proliferative diabetic retinopathy; PDR proliferative diabetic retinopathy; CSME clinically significant macular edema; DME diabetic macular edema; dbh dot blot hemorrhages; CWS cotton wool spot; POAG primary open angle glaucoma; C/D cup-to-disc ratio; HVF humphrey visual field; GVF goldmann visual field; OCT optical coherence tomography; IOP intraocular pressure; BRVO Branch retinal vein occlusion;  CRVO central retinal vein occlusion; CRAO central retinal artery occlusion; BRAO branch retinal artery occlusion; RT retinal tear; SB scleral buckle; PPV pars plana vitrectomy; VH Vitreous hemorrhage; PRP panretinal laser photocoagulation; IVK intravitreal kenalog ; VMT vitreomacular traction; MH Macular hole;  NVD neovascularization of the disc; NVE neovascularization elsewhere; AREDS age related eye disease study; ARMD age related  macular degeneration; POAG primary open angle glaucoma; EBMD epithelial/anterior basement membrane dystrophy; ACIOL anterior chamber intraocular lens; IOL intraocular lens; PCIOL posterior chamber intraocular lens; Phaco/IOL phacoemulsification with intraocular lens placement; PRK photorefractive keratectomy; LASIK laser assisted in situ keratomileusis; HTN hypertension; DM diabetes mellitus; COPD chronic obstructive pulmonary disease

## 2023-11-03 ENCOUNTER — Ambulatory Visit (INDEPENDENT_AMBULATORY_CARE_PROVIDER_SITE_OTHER): Admitting: Ophthalmology

## 2023-11-03 ENCOUNTER — Encounter (INDEPENDENT_AMBULATORY_CARE_PROVIDER_SITE_OTHER): Payer: Self-pay | Admitting: Ophthalmology

## 2023-11-03 DIAGNOSIS — H34832 Tributary (branch) retinal vein occlusion, left eye, with macular edema: Secondary | ICD-10-CM

## 2023-11-03 DIAGNOSIS — Z961 Presence of intraocular lens: Secondary | ICD-10-CM | POA: Diagnosis not present

## 2023-11-03 DIAGNOSIS — I10 Essential (primary) hypertension: Secondary | ICD-10-CM | POA: Diagnosis not present

## 2023-11-03 DIAGNOSIS — H35033 Hypertensive retinopathy, bilateral: Secondary | ICD-10-CM | POA: Diagnosis not present

## 2023-11-03 MED ORDER — FARICIMAB-SVOA 6 MG/0.05ML IZ SOSY
6.0000 mg | PREFILLED_SYRINGE | INTRAVITREAL | Status: AC | PRN
  Administered 2023-11-03: 6 mg via INTRAVITREAL

## 2023-12-01 NOTE — Progress Notes (Signed)
 [facility] - Clinic Note  12/15/2023    CHIEF COMPLAINT Patient presents for Retina Follow Up  HISTORY OF PRESENT ILLNESS: Glenn Hunt. is a 89 y.o. male who presents to the clinic today for:   HPI     Retina Follow Up   Patient presents with  CRVO/BRVO.  In left eye.  This started 6 weeks ago.  I, the attending physician,  performed the HPI with the patient and updated documentation appropriately.        Comments   Patient here for 6 weeks retina follow up for BRVO OS. Patient states vision alright. No eye pain.       Last edited by Valdemar Rogue, MD on 12/22/2023  5:55 PM.      Patient states he sees floaters at time. He feels his vision is the same.   Referring physician: Waylan Cain, MD [address] [place],  [place] [address]  HISTORICAL INFORMATION:   Selected notes from the MEDICAL RECORD NUMBER Referred by Dr. Waylan for HRVO LEE:  Ocular Hx- PMH-    CURRENT MEDICATIONS: No current outpatient medications on file. (Ophthalmic Drugs)   No current facility-administered medications for this visit. (Ophthalmic Drugs)   Current Outpatient Medications (Other)  Medication Sig   acetaminophen  (TYLENOL ) 500 MG tablet Take 1,000 mg by mouth every 8 (eight) hours as needed for moderate pain.   allopurinol  (ZYLOPRIM ) 300 MG tablet Take 300 mg by mouth daily.    cephALEXin  (KEFLEX ) 500 MG capsule Take 1 capsule (500 mg total) by mouth 3 (three) times daily.   ibuprofen (ADVIL) 200 MG tablet Take 600 mg by mouth every 8 (eight) hours as needed for moderate pain.   Multiple Vitamins-Minerals (MULTIVITAMIN ADULT PO) Take 1 tablet by mouth daily.   ondansetron  (ZOFRAN  ODT) 4 MG disintegrating tablet Take 1 tablet (4 mg total) by mouth every 8 (eight) hours as needed for nausea or vomiting.   potassium chloride  (KLOR-CON ) 10 MEQ tablet Take 10 mEq by mouth 2 (two) times daily.   torsemide  (DEMADEX ) 10 MG tablet Take 5 mg by mouth daily as needed  (fluid).   triamterene -hydrochlorothiazide  (MAXZIDE -25) 37.5-25 MG tablet Take 1 tablet by mouth daily.   No current facility-administered medications for this visit. (Other)   REVIEW OF SYSTEMS: ROS   Positive for: Musculoskeletal, Eyes Negative for: Constitutional, Gastrointestinal, Neurological, Skin, Genitourinary, HENT, Endocrine, Cardiovascular, Respiratory, Psychiatric, Allergic/Imm, Heme/Lymph Last edited by Elnor Avelina RAMAN, COT on 12/15/2023  9:06 AM.     ALLERGIES Allergies  Allergen Reactions   Famotidine Other (See Comments)   Tetracycline Hcl Other (See Comments)   Tetracyclines & Related Other (See Comments)    Infection of penis   PAST MEDICAL HISTORY Past Medical History:  Diagnosis Date   Anemia    Arthritis    CAD (coronary artery disease)    2007, inferior ischemia on nuclear scan, catheterization showed  60-70% ostial PDA, 90% distal RCA and a very large right coronary artery ( vision stent was placed 3.5 x 23 mm), 80-90% diagonal ( small vessel, wire could not be passed patient followed medically)   CAD (coronary artery disease)    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; 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 but considers himslef stable , he denies chest pain , headache, sob  , reports he does heavy exercise  states  i work out with a trainer 2-3 times a week and go walking  but i havent done much since the gyms closed     Cancer of kidney (HCC)    Creatinine elevation    Dieulafoy lesion of stomach    incidence of bleeds in 2015 and 2017 , required blood transfusion as bleed lef to a 4.5 hemoglobin   Gout    H/O right nephrectomy    History of blood transfusion    Hypertension    Skin cancer    skin , squamous cell    Tuberculosis 1962   Varicose veins of legs    bialtera; ive had them about 5 years now and theyve never been a problem    Past Surgical History:  Procedure Laterality Date   CATARACT EXTRACTION, BILATERAL  2017    COLONOSCOPY     CORONARY ANGIOPLASTY WITH STENT PLACEMENT  2006   ESOPHAGOGASTRODUODENOSCOPY  04/20/2012   Procedure: ESOPHAGOGASTRODUODENOSCOPY (EGD);  Surgeon: Lesta JULIANNA Fitz, MD;  Location: [facility] ENDOSCOPY;  Service: Endoscopy;  Laterality: N/A;   NEPHRECTOMY Right    REPORTS AVG CREATININE 1.4 FOR THE LAST 20 YEARS    REVERSE SHOULDER ARTHROPLASTY Right 02/14/2020   Procedure: REVERSE SHOULDER ARTHROPLASTY;  Surgeon: Sharl Selinda Dover, MD;  Location: [facility] ORS;  Service: Orthopedics;  Laterality: Right;  2.5 hrs   TOTAL KNEE ARTHROPLASTY Right 08/26/2018   Procedure: TOTAL KNEE ARTHROPLASTY;  Surgeon: Ernie Cough, MD;  Location: [facility] ORS;  Service: Orthopedics;  Laterality: Right;  70 mins   FAMILY HISTORY History reviewed. No pertinent family history.  SOCIAL HISTORY Social History   Tobacco Use   Smoking status: Former    Current packs/day: 0.00    Types: Cigarettes    Quit date: 04/01/2021    Years since quitting: 2.7   Smokeless tobacco: Never   Tobacco comments:    7 cigarettes a day fo the last 10 years   Vaping Use   Vaping status: Never Used  Substance Use Topics   Alcohol use: Yes    Alcohol/week: 7.0 standard drinks of alcohol    Types: 7 Glasses of wine per week    Comment: daily    Drug use: No       OPHTHALMIC EXAM:  Base Eye Exam     Visual Acuity (Snellen - Linear)       Right Left   Dist cc 20/20 -2 20/25 -2   Dist ph cc NI NI    Correction: Glasses         Tonometry (Tonopen, 9:09 AM)       Right Left   Pressure 16 12         Pupils       Pupils Dark Light Shape React APD   Right PERRL 4 3 Round Brisk None   Left PERRL 4 3 Round Brisk None         Visual Fields       Left Right    Full Full         Extraocular Movement       Right Left    Full, Ortho Full, Ortho         Neuro/Psych     Oriented x3: Yes   Mood/Affect: Normal         Dilation     Both eyes: 1.0% Mydriacyl, 2.5% Phenylephrine  @ 9:09 AM            Slit Lamp and Fundus Exam     Slit Lamp Exam       Right Left  Lids/Lashes Dermatochalasis - upper lid Dermatochalasis - upper lid   Conjunctiva/Sclera Mild conjunctivochalasis Mild conjunctivochalasis   Cornea 1+ Punctate epithelial erosions 1+ Punctate epithelial erosions   Anterior Chamber deep and clear deep and clear   Iris Round and dilated Round and dilated   Lens Toric PC IOL in good position with marks at 0300 and 0900 Toric PC IOL in good position with marks at 0300 and 0900   Anterior Vitreous Vitreous syneresis, PVD, vit condensations Vitreous syneresis, Posterior vitreous detachment         Fundus Exam       Right Left   Disc mild Pallor, Sharp rim mild pallor, sharp rim, +hyperemia -- improved, mild vascular loops nasal disc   C/D Ratio 0.7 0.6   Macula Flat, good foveal reflex, RPE mottling and clumping, No heme or edema Blunted foveal reflex, IRH superior macula -- stably improved, now rare MA greatest temporally, central cystic changes -- improved   Vessels attenuated, Tortuous attenuated, Tortuous, superior HRVO   Periphery Attached, No heme Attached, superior DBH -- improved, pigmented paving stone degeneration inferiorly           Refraction     Wearing Rx       Sphere Cylinder Axis Add   Right -1.00 +0.75 014 +2.75   Left -1.00 +1.50 148 +2.75           IMAGING AND PROCEDURES  Imaging and Procedures for 12/15/2023  OCT, Retina - OU - Both Eyes       Right Eye Quality was good. Central Foveal Thickness: 255. Progression has been stable. Findings include normal foveal contour, no IRF, no SRF, myopic contour, retinal drusen .   Left Eye Quality was good. Central Foveal Thickness: 266. Progression has improved. Findings include no SRF, abnormal foveal contour, myopic contour, intraretinal hyper-reflective material, intraretinal fluid (Interval improvement in cystic changes temporal fovea; stable improvement in superior macula).    Notes *Images captured and stored on drive  Diagnosis / Impression:  OD: NFP, no IRF/SRF OS: Interval improvement in cystic changes temporal fovea; stable improvement in superior macula  Clinical management:  See below  Abbreviations: NFP - Normal foveal profile. CME - cystoid macular edema. PED - pigment epithelial detachment. IRF - intraretinal fluid. SRF - subretinal fluid. EZ - ellipsoid zone. ERM - epiretinal membrane. ORA - outer retinal atrophy. ORT - outer retinal tubulation. SRHM - subretinal hyper-reflective material. IRHM - intraretinal hyper-reflective material      Intravitreal Injection, Pharmacologic Agent - OS - Left Eye       Time Out 12/15/2023. 9:54 AM. Confirmed correct patient, procedure, site, and patient consented.   Anesthesia Topical anesthesia was used. Anesthetic medications included Lidocaine  2%, Proparacaine 0.5%.   Procedure Preparation included 5% betadine  to ocular surface, eyelid speculum. A (32g) needle was used.   Injection: 6 mg faricimab -svoa 6 MG/0.05ML   Route: Intravitreal, Site: Left Eye   NDC: 49757-903-93, Lot: [id number], Expiration date: 12/28/2024, Waste: 0 mL   Post-op Post injection exam found visual acuity of at least counting fingers. The patient tolerated the procedure well. There were no complications. The patient received written and verbal post procedure care education. Post injection medications were not given.             ASSESSMENT/PLAN:    ICD-10-CM   1. Branch retinal vein occlusion of left eye with macular edema  H34.8320 OCT, Retina - OU - Both Eyes    Intravitreal Injection, Pharmacologic Agent - OS -  Left Eye    faricimab -svoa (VABYSMO ) 6mg /0.63mL intravitreal injection    2. Essential hypertension  I10     3. Hypertensive retinopathy of both eyes  H35.033     4. Pseudophakia of both eyes  Z96.1      Superior HRVO w/ CME OS - s/p IVA OS #1 (10.09.23), #2 (11.06.23), #3 (12.04.23), #4 (01.02.24), #5  (01.30.24) -- IVA resistance ===================== - s/p IVE OS #1 (02.28.24), #2 (03.27.24), #3 (04.24.24), #4 (05.22.24), #5 (06.20.24), #6 (07.19.24) -- IVE resistance ===================== - s/p IVV OS #1 (08.21.24), #2 (09.18.24), #3 (10.21.24), #4 (11.19.24), #5 (12.17.24), #6 (sample 01.14.25), #7 (02.18.25), #8 (04.03.25), #9 (05.13.25), #10 (06.24.25), #11 (08.05.25) - BCVA OS stable at 20/25  - exam shows continued improvement in diffuse retinal hemorrhages -- essentially resolved - OCT shows  Interval improvement in cystic changes temporal fovea; stable improvement in superior macula at 6 weeks - recommend IVV OS #12 today, 09.16.25 w/ f/u in 6 -7 wks again - RBA of procedure discussed, questions answered - Vabysmo  informed consent obtained and signed OS 08.21.24 - see procedure note - [place]  prior auth obtained for 2025, Vabysmo  approved for 2025, but [employer] funding unavailable - pt covering 20% coinsurance on medication - F/U in 6-7 weeks -- DFE/OCT/possible injection  2,3. Hypertensive retinopathy OU - discussed importance of tight BP control - continue to monitor  4. Pseudophakia OU  - s/p CE/toric IOL OU  - IOLs in good position, doing well  - continue to monitor  Ophthalmic Meds Ordered this visit:  Meds ordered this encounter  Medications   faricimab -svoa (VABYSMO ) 6mg /0.21mL intravitreal injection     Return in about 7 weeks (around 02/02/2024) for f/u HRVO , DFE, OCT, Possible, IVV, OS.  There are no Patient Instructions on file for this visit.  This document serves as a record of services personally performed by Redell JUDITHANN Hans, MD, PhD. It was created on their behalf by Wanda GEANNIE Keens, COT an ophthalmic technician. The creation of this record is the provider's dictation and/or activities during the visit.    Electronically signed by:  Wanda GEANNIE Keens, COT  12/22/23 6:02 PM  Redell JUDITHANN Hans, M.D., Ph.D. Diseases & Surgery of the Retina and  Vitreous [facility]  I have reviewed the above documentation for accuracy and completeness, and I agree with the above. Redell JUDITHANN Hans, M.D., Ph.D. 12/22/23 6:03 PM   Abbreviations: M myopia (nearsighted); A astigmatism; H hyperopia (farsighted); P presbyopia; Mrx spectacle prescription;  CTL contact lenses; OD right eye; OS left eye; OU both eyes  XT exotropia; ET esotropia; PEK punctate epithelial keratitis; PEE punctate epithelial erosions; DES dry eye syndrome; MGD meibomian gland dysfunction; ATs artificial tears; PFAT's preservative free artificial tears; NSC nuclear sclerotic cataract; PSC posterior subcapsular cataract; ERM epi-retinal membrane; PVD posterior vitreous detachment; RD retinal detachment; DM diabetes mellitus; DR diabetic retinopathy; NPDR non-proliferative diabetic retinopathy; PDR proliferative diabetic retinopathy; CSME clinically significant macular edema; DME diabetic macular edema; dbh dot blot hemorrhages; CWS cotton wool spot; POAG primary open angle glaucoma; C/D cup-to-disc ratio; HVF humphrey visual field; GVF goldmann visual field; OCT optical coherence tomography; IOP intraocular pressure; BRVO Branch retinal vein occlusion; CRVO central retinal vein occlusion; CRAO central retinal artery occlusion; BRAO branch retinal artery occlusion; RT retinal tear; SB scleral buckle; PPV pars plana vitrectomy; VH Vitreous hemorrhage; PRP panretinal laser photocoagulation; IVK intravitreal kenalog ; VMT vitreomacular traction; MH Macular hole;  NVD neovascularization of the disc; NVE neovascularization  elsewhere; AREDS age related eye disease study; ARMD age related macular degeneration; POAG primary open angle glaucoma; EBMD epithelial/anterior basement membrane dystrophy; ACIOL anterior chamber intraocular lens; IOL intraocular lens; PCIOL posterior chamber intraocular lens; Phaco/IOL phacoemulsification with intraocular lens placement; PRK photorefractive  keratectomy; LASIK laser assisted in situ keratomileusis; HTN hypertension; DM diabetes mellitus; COPD chronic obstructive pulmonary disease

## 2023-12-15 ENCOUNTER — Encounter (INDEPENDENT_AMBULATORY_CARE_PROVIDER_SITE_OTHER): Payer: Self-pay | Admitting: Ophthalmology

## 2023-12-15 ENCOUNTER — Ambulatory Visit (INDEPENDENT_AMBULATORY_CARE_PROVIDER_SITE_OTHER): Admitting: Ophthalmology

## 2023-12-15 DIAGNOSIS — I10 Essential (primary) hypertension: Secondary | ICD-10-CM | POA: Diagnosis not present

## 2023-12-15 DIAGNOSIS — H35033 Hypertensive retinopathy, bilateral: Secondary | ICD-10-CM

## 2023-12-15 DIAGNOSIS — H34832 Tributary (branch) retinal vein occlusion, left eye, with macular edema: Secondary | ICD-10-CM | POA: Diagnosis not present

## 2023-12-15 DIAGNOSIS — Z961 Presence of intraocular lens: Secondary | ICD-10-CM | POA: Diagnosis not present

## 2023-12-22 ENCOUNTER — Encounter (INDEPENDENT_AMBULATORY_CARE_PROVIDER_SITE_OTHER): Payer: Self-pay | Admitting: Ophthalmology

## 2023-12-22 MED ORDER — FARICIMAB-SVOA 6 MG/0.05ML IZ SOSY
6.0000 mg | PREFILLED_SYRINGE | INTRAVITREAL | Status: AC | PRN
  Administered 2023-12-22: 6 mg via INTRAVITREAL

## 2024-01-19 NOTE — Progress Notes (Signed)
 [facility] - Clinic Note  02/02/2024    CHIEF COMPLAINT Patient presents for Retina Follow Up  HISTORY OF PRESENT ILLNESS: Glenn Hunt. is a 89 y.o. male who presents to the clinic today for:   HPI     Retina Follow Up   In left eye.  This started 7 weeks ago.  Duration of 7 weeks.  Since onset it is stable.  I, the attending physician,  performed the HPI with the patient and updated documentation appropriately.        Comments   7 week retina follow up HRVO OS and IVV OS pt is reporting no vision changes noticed he denies any flashes or floaters       Last edited by Valdemar Rogue, MD on 02/02/2024  7:28 PM.    Patient states he's feeling good.   Referring physician: Waylan Cain, MD [address] [place],  [place] [address]  HISTORICAL INFORMATION:   Selected notes from the MEDICAL RECORD NUMBER Referred by Dr. Waylan for HRVO LEE:  Ocular Hx- PMH-    CURRENT MEDICATIONS: No current outpatient medications on file. (Ophthalmic Drugs)   No current facility-administered medications for this visit. (Ophthalmic Drugs)   Current Outpatient Medications (Other)  Medication Sig   acetaminophen  (TYLENOL ) 500 MG tablet Take 1,000 mg by mouth every 8 (eight) hours as needed for moderate pain.   allopurinol  (ZYLOPRIM ) 300 MG tablet Take 300 mg by mouth daily.    cephALEXin  (KEFLEX ) 500 MG capsule Take 1 capsule (500 mg total) by mouth 3 (three) times daily.   ibuprofen (ADVIL) 200 MG tablet Take 600 mg by mouth every 8 (eight) hours as needed for moderate pain.   Multiple Vitamins-Minerals (MULTIVITAMIN ADULT PO) Take 1 tablet by mouth daily.   ondansetron  (ZOFRAN  ODT) 4 MG disintegrating tablet Take 1 tablet (4 mg total) by mouth every 8 (eight) hours as needed for nausea or vomiting.   potassium chloride  (KLOR-CON ) 10 MEQ tablet Take 10 mEq by mouth 2 (two) times daily.   torsemide  (DEMADEX ) 10 MG tablet Take 5 mg by mouth daily as needed (fluid).    triamterene -hydrochlorothiazide  (MAXZIDE -25) 37.5-25 MG tablet Take 1 tablet by mouth daily.   No current facility-administered medications for this visit. (Other)   REVIEW OF SYSTEMS: ROS   Positive for: Musculoskeletal, Eyes Negative for: Constitutional, Gastrointestinal, Neurological, Skin, Genitourinary, HENT, Endocrine, Cardiovascular, Respiratory, Psychiatric, Allergic/Imm, Heme/Lymph Last edited by Resa Delon ORN, COT on 02/02/2024  8:59 AM.      ALLERGIES Allergies  Allergen Reactions   Famotidine Other (See Comments)   Tetracycline Hcl Other (See Comments)   Tetracyclines & Related Other (See Comments)    Infection of penis   PAST MEDICAL HISTORY Past Medical History:  Diagnosis Date   Anemia    Arthritis    CAD (coronary artery disease)    2007, inferior ischemia on nuclear scan, catheterization showed  60-70% ostial PDA, 90% distal RCA and a very large right coronary artery ( vision stent was placed 3.5 x 23 mm), 80-90% diagonal ( small vessel, wire could not be passed patient followed medically)   CAD (coronary artery disease)    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; 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 but considers himslef stable , he denies chest pain , headache, sob  , reports he does heavy exercise  states  i work out with a trainer 2-3 times a week and go walking  but i havent done much since the gyms closed     Cancer of kidney (HCC)    Creatinine elevation    Dieulafoy lesion of stomach    incidence of bleeds in 2015 and 2017 , required blood transfusion as bleed lef to a 4.5 hemoglobin   Gout    H/O right nephrectomy    History of blood transfusion    Hypertension    Skin cancer    skin , squamous cell    Tuberculosis 1962   Varicose veins of legs    bialtera; ive had them about 5 years now and theyve never been a problem    Past Surgical History:  Procedure Laterality Date   CATARACT EXTRACTION, BILATERAL  2017    COLONOSCOPY     CORONARY ANGIOPLASTY WITH STENT PLACEMENT  2006   ESOPHAGOGASTRODUODENOSCOPY  04/20/2012   Procedure: ESOPHAGOGASTRODUODENOSCOPY (EGD);  Surgeon: Lesta JULIANNA Fitz, MD;  Location[facility] ENDOSCOPY;  Service: Endoscopy;  Laterality: N/A;   NEPHRECTOMY Right    REPORTS AVG CREATININE 1.4 FOR THE LAST 20 YEARS    REVERSE SHOULDER ARTHROPLASTY Right 02/14/2020   Procedure: REVERSE SHOULDER ARTHROPLASTY;  Surgeon: Sharl Selinda Dover, MD;  Location: [facility] ORS;  Service: Orthopedics;  Laterality: Right;  2.5 hrs   TOTAL KNEE ARTHROPLASTY Right 08/26/2018   Procedure: TOTAL KNEE ARTHROPLASTY;  Surgeon: Ernie Cough, MD;  Location: [facility] ORS;  Service: Orthopedics;  Laterality: Right;  70 mins   FAMILY HISTORY History reviewed. No pertinent family history.  SOCIAL HISTORY Social History   Tobacco Use   Smoking status: Former    Current packs/day: 0.00    Types: Cigarettes    Quit date: 04/01/2021    Years since quitting: 2.8   Smokeless tobacco: Never   Tobacco comments:    7 cigarettes a day fo the last 10 years   Vaping Use   Vaping status: Never Used  Substance Use Topics   Alcohol use: Yes    Alcohol/week: 7.0 standard drinks of alcohol    Types: 7 Glasses of wine per week    Comment: daily    Drug use: No       OPHTHALMIC EXAM:  Base Eye Exam     Visual Acuity (Snellen - Linear)       Right Left   Dist cc 20/25 -2 20/30 -2   Dist ph cc NI NI         Tonometry (Tonopen, 9:03 AM)       Right Left   Pressure 16 15         Pupils       Pupils Dark Light Shape React APD   Right PERRL 4 3 Round Brisk None   Left PERRL 4 3 Round Brisk None         Visual Fields       Left Right    Full Full         Extraocular Movement       Right Left    Full, Ortho Full, Ortho         Neuro/Psych     Oriented x3: Yes   Mood/Affect: Normal         Dilation     Both eyes: 2.5% Phenylephrine  @ 9:03 AM           Slit Lamp and Fundus Exam      Slit Lamp Exam       Right Left   Lids/Lashes Dermatochalasis - upper lid  Dermatochalasis - upper lid   Conjunctiva/Sclera Mild conjunctivochalasis Mild conjunctivochalasis   Cornea 1+ Punctate epithelial erosions 1+ Punctate epithelial erosions   Anterior Chamber deep and clear deep and clear   Iris Round and dilated Round and dilated   Lens Toric PC IOL in good position with marks at 0300 and 0900 Toric PC IOL in good position with marks at 0300 and 0900   Anterior Vitreous Vitreous syneresis, PVD, vit condensations Vitreous syneresis, Posterior vitreous detachment         Fundus Exam       Right Left   Disc mild Pallor, Sharp rim mild pallor, sharp rim, +hyperemia -- improved, mild vascular loops nasal disc   C/D Ratio 0.7 0.6   Macula Flat, good foveal reflex, RPE mottling and clumping, No heme or edema Blunted foveal reflex, IRH superior macula -- stably improved, now rare MA greatest temporally, central cystic changes -- improved   Vessels attenuated, Tortuous attenuated, Tortuous, superior HRVO   Periphery Attached, No heme Attached, superior DBH -- improved, pigmented paving stone degeneration inferiorly           Refraction     Wearing Rx       Sphere Cylinder Axis Add   Right -1.00 +0.75 014 +2.75   Left -1.00 +1.50 148 +2.75           IMAGING AND PROCEDURES  Imaging and Procedures for 02/02/2024  OCT, Retina - OU - Both Eyes       Right Eye Quality was good. Central Foveal Thickness: 262. Progression has been stable. Findings include normal foveal contour, no IRF, no SRF, myopic contour, retinal drusen .   Left Eye Quality was good. Central Foveal Thickness: 263. Progression has improved. Findings include no SRF, abnormal foveal contour, myopic contour, intraretinal hyper-reflective material, intraretinal fluid (Mild interval improvement in cystic changes temporal fovea; stable improvement in superior macula).   Notes *Images captured and stored on  drive  Diagnosis / Impression:  OD: NFP, no IRF/SRF OS: Mild interval improvement in cystic changes temporal fovea; stable improvement in superior macula  Clinical management:  See below  Abbreviations: NFP - Normal foveal profile. CME - cystoid macular edema. PED - pigment epithelial detachment. IRF - intraretinal fluid. SRF - subretinal fluid. EZ - ellipsoid zone. ERM - epiretinal membrane. ORA - outer retinal atrophy. ORT - outer retinal tubulation. SRHM - subretinal hyper-reflective material. IRHM - intraretinal hyper-reflective material      Intravitreal Injection, Pharmacologic Agent - OS - Left Eye       Time Out 02/02/2024. 9:03 AM. Confirmed correct patient, procedure, site, and patient consented.   Anesthesia Topical anesthesia was used. Anesthetic medications included Lidocaine  2%, Proparacaine 0.5%.   Procedure Preparation included 5% betadine  to ocular surface, eyelid speculum. A (32g) needle was used.   Injection: 6 mg faricimab -svoa 6 MG/0.05ML   Route: Intravitreal, Site: Left Eye   NDC: 49757-903-93, Lot: [id number], Expiration date: 12/28/2024, Waste: 0 mL   Post-op Post injection exam found visual acuity of at least counting fingers. The patient tolerated the procedure well. There were no complications. The patient received written and verbal post procedure care education. Post injection medications were not given.            ASSESSMENT/PLAN:    ICD-10-CM   1. Branch retinal vein occlusion of left eye with macular edema (HCC)  H34.8320 OCT, Retina - OU - Both Eyes    Intravitreal Injection, Pharmacologic Agent - OS - Left Eye  faricimab -svoa (VABYSMO ) 6mg /0.73mL intravitreal injection    2. Essential hypertension  I10     3. Hypertensive retinopathy of both eyes  H35.033     4. Pseudophakia of both eyes  Z96.1      Superior HRVO w/ CME OS - s/p IVA OS #1 (10.09.23), #2 (11.06.23), #3 (12.04.23), #4 (01.02.24), #5 (01.30.24) -- IVA  resistance ===================== - s/p IVE OS #1 (02.28.24), #2 (03.27.24), #3 (04.24.24), #4 (05.22.24), #5 (06.20.24), #6 (07.19.24) -- IVE resistance ===================== - s/p IVV OS #1 (08.21.24), #2 (09.18.24), #3 (10.21.24), #4 (11.19.24), #5 (12.17.24), #6 (sample 01.14.25), #7 (02.18.25), #8 (04.03.25), #9 (05.13.25), #10 (06.24.25), #11 (08.05.25), #12 (09.16.25) - BCVA OS 20/30 from 20/25  - exam shows continued improvement in diffuse retinal hemorrhages -- essentially resolved - OCT shows mild interval improvement in cystic changes temporal fovea; stable improvement in superior macula at 7 weeks - recommend IVV OS #13 today, 11.04.25 w/ f/u in 7 wks again - RBA of procedure discussed, questions answered - Vabysmo  informed consent obtained and signed OS 08.21.24 - see procedure note - [place]  prior auth obtained for 2025, Vabysmo  approved for 2025, but [employer] funding unavailable - pt covering 20% coinsurance on medication - F/U in 7 weeks -- DFE/OCT/possible injection  2,3. Hypertensive retinopathy OU - discussed importance of tight BP control - continue to monitor  4. Pseudophakia OU  - s/p CE/toric IOL OU  - IOLs in good position, doing well  - continue to monitor  Ophthalmic Meds Ordered this visit:  Meds ordered this encounter  Medications   faricimab -svoa (VABYSMO ) 6mg /0.53mL intravitreal injection     Return in about 7 weeks (around 03/22/2024) for HRVO OS, DFE, OCT, Possible Injxn.  There are no Patient Instructions on file for this visit.  This document serves as a record of services personally performed by Redell JUDITHANN Hans, MD, PhD. It was created on their behalf by Wanda GEANNIE Keens, COT an ophthalmic technician. The creation of this record is the provider's dictation and/or activities during the visit.    Electronically signed by:  Wanda GEANNIE Keens, COT  02/02/24 7:29 PM  This document serves as a record of services personally performed by Redell JUDITHANN Hans, MD, PhD. It was created on their behalf by Almetta Pesa, an ophthalmic technician. The creation of this record is the provider's dictation and/or activities during the visit.    Electronically signed by: Almetta Pesa, OA, 02/02/24  7:29 PM  Redell JUDITHANN Hans, M.D., Ph.D. Diseases & Surgery of the Retina and Vitreous [facility]  I have reviewed the above documentation for accuracy and completeness, and I agree with the above. Redell JUDITHANN Hans, M.D., Ph.D. 02/02/24 7:31 PM   Abbreviations: M myopia (nearsighted); A astigmatism; H hyperopia (farsighted); P presbyopia; Mrx spectacle prescription;  CTL contact lenses; OD right eye; OS left eye; OU both eyes  XT exotropia; ET esotropia; PEK punctate epithelial keratitis; PEE punctate epithelial erosions; DES dry eye syndrome; MGD meibomian gland dysfunction; ATs artificial tears; PFAT's preservative free artificial tears; NSC nuclear sclerotic cataract; PSC posterior subcapsular cataract; ERM epi-retinal membrane; PVD posterior vitreous detachment; RD retinal detachment; DM diabetes mellitus; DR diabetic retinopathy; NPDR non-proliferative diabetic retinopathy; PDR proliferative diabetic retinopathy; CSME clinically significant macular edema; DME diabetic macular edema; dbh dot blot hemorrhages; CWS cotton wool spot; POAG primary open angle glaucoma; C/D cup-to-disc ratio; HVF humphrey visual field; GVF goldmann visual field; OCT optical coherence tomography; IOP intraocular pressure; BRVO Branch retinal vein  occlusion; CRVO central retinal vein occlusion; CRAO central retinal artery occlusion; BRAO branch retinal artery occlusion; RT retinal tear; SB scleral buckle; PPV pars plana vitrectomy; VH Vitreous hemorrhage; PRP panretinal laser photocoagulation; IVK intravitreal kenalog ; VMT vitreomacular traction; MH Macular hole;  NVD neovascularization of the disc; NVE neovascularization elsewhere; AREDS age related eye disease  study; ARMD age related macular degeneration; POAG primary open angle glaucoma; EBMD epithelial/anterior basement membrane dystrophy; ACIOL anterior chamber intraocular lens; IOL intraocular lens; PCIOL posterior chamber intraocular lens; Phaco/IOL phacoemulsification with intraocular lens placement; PRK photorefractive keratectomy; LASIK laser assisted in situ keratomileusis; HTN hypertension; DM diabetes mellitus; COPD chronic obstructive pulmonary disease

## 2024-02-02 ENCOUNTER — Encounter (INDEPENDENT_AMBULATORY_CARE_PROVIDER_SITE_OTHER): Payer: Self-pay | Admitting: Ophthalmology

## 2024-02-02 ENCOUNTER — Ambulatory Visit (INDEPENDENT_AMBULATORY_CARE_PROVIDER_SITE_OTHER): Admitting: Ophthalmology

## 2024-02-02 DIAGNOSIS — H35033 Hypertensive retinopathy, bilateral: Secondary | ICD-10-CM | POA: Diagnosis not present

## 2024-02-02 DIAGNOSIS — H34832 Tributary (branch) retinal vein occlusion, left eye, with macular edema: Secondary | ICD-10-CM | POA: Diagnosis not present

## 2024-02-02 DIAGNOSIS — Z961 Presence of intraocular lens: Secondary | ICD-10-CM

## 2024-02-02 DIAGNOSIS — I10 Essential (primary) hypertension: Secondary | ICD-10-CM | POA: Diagnosis not present

## 2024-02-02 MED ORDER — FARICIMAB-SVOA 6 MG/0.05ML IZ SOSY
6.0000 mg | PREFILLED_SYRINGE | INTRAVITREAL | Status: AC | PRN
  Administered 2024-02-02: 6 mg via INTRAVITREAL

## 2024-03-01 NOTE — Progress Notes (Signed)
 [facility] - Clinic Note  03/15/2024    CHIEF COMPLAINT Patient presents for Retina Follow Up  HISTORY OF PRESENT ILLNESS: Glenn Issa. is a 89 y.o. male who presents to the clinic today for:   HPI     Retina Follow Up   In left eye.  This started 2 years ago.  Severity is moderate.  Duration of 6 weeks.  Since onset it is stable.  I, the attending physician,  performed the HPI with the patient and updated documentation appropriately.        Comments   Pt denies any changes or concerns with vision.      Last edited by Elnor Avelina RAMAN, COT on 03/15/2024  8:47 AM.     Patient states   Referring physician: Waylan Cain, MD [address] [place],  [place] [address]  HISTORICAL INFORMATION:   Selected notes from the MEDICAL RECORD NUMBER Referred by Dr. Waylan for HRVO LEE:  Ocular Hx- PMH-    CURRENT MEDICATIONS: No current outpatient medications on file. (Ophthalmic Drugs)   No current facility-administered medications for this visit. (Ophthalmic Drugs)   Current Outpatient Medications (Other)  Medication Sig   acetaminophen  (TYLENOL ) 500 MG tablet Take 1,000 mg by mouth every 8 (eight) hours as needed for moderate pain.   allopurinol  (ZYLOPRIM ) 300 MG tablet Take 300 mg by mouth daily.    cephALEXin  (KEFLEX ) 500 MG capsule Take 1 capsule (500 mg total) by mouth 3 (three) times daily.   ibuprofen (ADVIL) 200 MG tablet Take 600 mg by mouth every 8 (eight) hours as needed for moderate pain.   Multiple Vitamins-Minerals (MULTIVITAMIN ADULT PO) Take 1 tablet by mouth daily.   ondansetron  (ZOFRAN  ODT) 4 MG disintegrating tablet Take 1 tablet (4 mg total) by mouth every 8 (eight) hours as needed for nausea or vomiting.   potassium chloride  (KLOR-CON ) 10 MEQ tablet Take 10 mEq by mouth 2 (two) times daily.   torsemide  (DEMADEX ) 10 MG tablet Take 5 mg by mouth daily as needed (fluid).   triamterene -hydrochlorothiazide  (MAXZIDE -25) 37.5-25 MG  tablet Take 1 tablet by mouth daily.   No current facility-administered medications for this visit. (Other)   REVIEW OF SYSTEMS: ROS   Positive for: Musculoskeletal, Eyes Negative for: Constitutional, Gastrointestinal, Neurological, Skin, Genitourinary, HENT, Endocrine, Cardiovascular, Respiratory, Psychiatric, Allergic/Imm, Heme/Lymph Last edited by Elnor Avelina RAMAN, COT on 03/15/2024  8:38 AM.       ALLERGIES Allergies  Allergen Reactions   Famotidine Other (See Comments)   Tetracycline Hcl Other (See Comments)   Tetracyclines & Related Other (See Comments)    Infection of penis   PAST MEDICAL HISTORY Past Medical History:  Diagnosis Date   Anemia    Arthritis    CAD (coronary artery disease)    2007, inferior ischemia on nuclear scan, catheterization showed  60-70% ostial PDA, 90% distal RCA and a very large right coronary artery ( vision stent was placed 3.5 x 23 mm), 80-90% diagonal ( small vessel, wire could not be passed patient followed medically)   CAD (coronary artery disease)    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; 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 but considers himslef stable , he denies chest pain , headache, sob  , reports he does heavy exercise  states  i work out with a trainer 2-3 times a week and go walking but i havent done much since the gyms closed  Cancer of kidney (HCC)    Creatinine elevation    Dieulafoy lesion of stomach    incidence of bleeds in 2015 and 2017 , required blood transfusion as bleed lef to a 4.5 hemoglobin   Gout    H/O right nephrectomy    History of blood transfusion    Hypertension    Skin cancer    skin , squamous cell    Tuberculosis 1962   Varicose veins of legs    bialtera; ive had them about 5 years now and theyve never been a problem    Past Surgical History:  Procedure Laterality Date   CATARACT EXTRACTION, BILATERAL  2017   COLONOSCOPY     CORONARY ANGIOPLASTY WITH STENT PLACEMENT  2006    ESOPHAGOGASTRODUODENOSCOPY  04/20/2012   Procedure: ESOPHAGOGASTRODUODENOSCOPY (EGD);  Surgeon: Lesta JULIANNA Fitz, MD;  Location: [facility] ENDOSCOPY;  Service: Endoscopy;  Laterality: N/A;   NEPHRECTOMY Right    REPORTS AVG CREATININE 1.4 FOR THE LAST 20 YEARS    REVERSE SHOULDER ARTHROPLASTY Right 02/14/2020   Procedure: REVERSE SHOULDER ARTHROPLASTY;  Surgeon: Sharl Selinda Dover, MD;  Location: [facility] ORS;  Service: Orthopedics;  Laterality: Right;  2.5 hrs   TOTAL KNEE ARTHROPLASTY Right 08/26/2018   Procedure: TOTAL KNEE ARTHROPLASTY;  Surgeon: Ernie Cough, MD;  Location: [facility] ORS;  Service: Orthopedics;  Laterality: Right;  70 mins   FAMILY HISTORY History reviewed. No pertinent family history.  SOCIAL HISTORY Social History   Tobacco Use   Smoking status: Former    Current packs/day: 0.00    Types: Cigarettes    Quit date: 04/01/2021    Years since quitting: 2.9   Smokeless tobacco: Never   Tobacco comments:    7 cigarettes a day fo the last 10 years   Vaping Use   Vaping status: Never Used  Substance Use Topics   Alcohol use: Yes    Alcohol/week: 7.0 standard drinks of alcohol    Types: 7 Glasses of wine per week    Comment: daily    Drug use: No       OPHTHALMIC EXAM:  Base Eye Exam     Visual Acuity (Snellen - Linear)       Right Left   Dist cc 20/20 -1 20/30 -2    Correction: Glasses         Tonometry (Tonopen, 8:42 AM)       Right Left   Pressure 14 11         Pupils       Pupils Dark Light Shape React APD   Right PERRL 4 3 Round Brisk None   Left PERRL 4 3 Round Brisk None         Visual Fields       Left Right    Full Full         Extraocular Movement       Right Left    Full, Ortho Full, Ortho         Neuro/Psych     Oriented x3: Yes   Mood/Affect: Normal         Dilation     Both eyes: 1.0% Mydriacyl, 2.5% Phenylephrine  @ 8:42 AM           Slit Lamp and Fundus Exam     Slit Lamp Exam       Right Left    Lids/Lashes Dermatochalasis - upper lid Dermatochalasis - upper lid   Conjunctiva/Sclera Mild conjunctivochalasis Mild conjunctivochalasis  Cornea 1+ Punctate epithelial erosions 1+ Punctate epithelial erosions   Anterior Chamber deep and clear deep and clear   Iris Round and dilated Round and dilated   Lens Toric PC IOL in good position with marks at 0300 and 0900 Toric PC IOL in good position with marks at 0300 and 0900   Anterior Vitreous Vitreous syneresis, PVD, vit condensations Vitreous syneresis, Posterior vitreous detachment         Fundus Exam       Right Left   Disc mild Pallor, Sharp rim mild pallor, sharp rim, +hyperemia -- improved, mild vascular loops nasal disc   C/D Ratio 0.7 0.6   Macula Flat, good foveal reflex, RPE mottling and clumping, No heme or edema Blunted foveal reflex, IRH superior macula -- stably improved, now rare MA greatest temporally, central cystic changes -- improved   Vessels attenuated, Tortuous attenuated, Tortuous, superior HRVO   Periphery Attached, No heme Attached, superior DBH -- improved, pigmented paving stone degeneration inferiorly           Refraction     Wearing Rx       Sphere Cylinder Axis Add   Right -1.00 +0.75 014 +2.75   Left -1.00 +1.50 148 +2.75           IMAGING AND PROCEDURES  Imaging and Procedures for 03/15/2024  OCT, Retina - OU - Both Eyes       Right Eye Quality was good. Central Foveal Thickness: 258. Progression has been stable. Findings include normal foveal contour, no IRF, no SRF, myopic contour, retinal drusen .   Left Eye Quality was good. Central Foveal Thickness: 266. Progression has improved. Findings include no SRF, abnormal foveal contour, myopic contour, intraretinal hyper-reflective material, intraretinal fluid (Mild interval improvement in cystic changes temporal fovea; stable improvement in superior macula).   Notes *Images captured and stored on drive  Diagnosis / Impression:  OD:  NFP, no IRF/SRF OS: Mild interval improvement in cystic changes temporal fovea; stable improvement in superior macula  Clinical management:  See below  Abbreviations: NFP - Normal foveal profile. CME - cystoid macular edema. PED - pigment epithelial detachment. IRF - intraretinal fluid. SRF - subretinal fluid. EZ - ellipsoid zone. ERM - epiretinal membrane. ORA - outer retinal atrophy. ORT - outer retinal tubulation. SRHM - subretinal hyper-reflective material. IRHM - intraretinal hyper-reflective material      Intravitreal Injection, Pharmacologic Agent - OS - Left Eye       Time Out 03/15/2024. 9:42 AM. Confirmed correct patient, procedure, site, and patient consented.   Anesthesia Topical anesthesia was used. Anesthetic medications included Lidocaine  2%, Proparacaine 0.5%.   Procedure Preparation included 5% betadine  to ocular surface, eyelid speculum. A (32g) needle was used.   Injection: 6 mg faricimab -svoa 6 MG/0.05ML   Route: Intravitreal, Site: Left Eye   NDC: 49757-903-93, Lot: [id number], Expiration date: 04/29/2025, Waste: 0 mL   Post-op Post injection exam found visual acuity of at least counting fingers. The patient tolerated the procedure well. There were no complications. The patient received written and verbal post procedure care education. Post injection medications were not given.             ASSESSMENT/PLAN:    ICD-10-CM   1. Branch retinal vein occlusion of left eye with macular edema (HCC)  H34.8320 OCT, Retina - OU - Both Eyes    Intravitreal Injection, Pharmacologic Agent - OS - Left Eye    2. Essential hypertension  I10  3. Hypertensive retinopathy of both eyes  H35.033     4. Pseudophakia of both eyes  Z96.1      Superior HRVO w/ CME OS - s/p IVA OS #1 (10.09.23), #2 (11.06.23), #3 (12.04.23), #4 (01.02.24), #5 (01.30.24)  -- IVA resistance ===================== - s/p IVE OS #1 (02.28.24), #2 (03.27.24), #3 (04.24.24), #4 (05.22.24), #5  (06.20.24), #6 (07.19.24)  -- IVE resistance ===================== - s/p IVV OS #1 (08.21.24), #2 (09.18.24), #3 (10.21.24), #4 (11.19.24), #5 (12.17.24), #6 (sample 01.14.25), #7 (02.18.25), #8 (04.03.25), #9 (05.13.25), #10 (06.24.25), #11 (08.05.25), #12 (09.16.25), #13 (11.04.25) - BCVA OS 20/30 stable - exam shows continued improvement in diffuse retinal hemorrhages -- essentially resolved - OCT shows mild interval improvement in cystic changes temporal fovea; stable improvement in superior macula at 6 weeks - recommend IVV OS #14 today, 12.16.25 w/ f/u in 7 wks again - RBA of procedure discussed, questions answered - Vabysmo  informed consent obtained and signed OS 08.21.24 - see procedure note - Eylea  prior auth obtained for 2025, Vabysmo  approved for 2025, but [employer] funding unavailable - pt covering 20% coinsurance on medication - F/U in 7 weeks -- DFE/OCT/possible injection  2,3. Hypertensive retinopathy OU - discussed importance of tight BP control - continue to monitor  4. Pseudophakia OU  - s/p CE/toric IOL OU  - IOLs in good position, doing well  - continue to monitor  Ophthalmic Meds Ordered this visit:  No orders of the defined types were placed in this encounter.    Return in about 7 weeks (around 05/03/2024) for f/u, BRVO, DFE, OCT, Possible, IVV, OS.  There are no Patient Instructions on file for this visit.  This document serves as a record of services personally performed by Redell JUDITHANN Hans, MD, PhD. It was created on their behalf by Wanda GEANNIE Keens, COT an ophthalmic technician. The creation of this record is the provider's dictation and/or activities during the visit.    Electronically signed by:  Wanda GEANNIE Keens, COT  03/15/2024 10:15 AM  This document serves as a record of services personally performed by Redell JUDITHANN Hans, MD, PhD. It was created on their behalf by Almetta Pesa, an ophthalmic technician. The creation of this record is the  provider's dictation and/or activities during the visit.    Electronically signed by: Almetta Pesa, OA, 03/15/2024  10:15 AM   Redell JUDITHANN Hans, M.D., Ph.D. Diseases & Surgery of the Retina and Vitreous [facility]   Abbreviations: M myopia (nearsighted); A astigmatism; H hyperopia (farsighted); P presbyopia; Mrx spectacle prescription;  CTL contact lenses; OD right eye; OS left eye; OU both eyes  XT exotropia; ET esotropia; PEK punctate epithelial keratitis; PEE punctate epithelial erosions; DES dry eye syndrome; MGD meibomian gland dysfunction; ATs artificial tears; PFAT's preservative free artificial tears; NSC nuclear sclerotic cataract; PSC posterior subcapsular cataract; ERM epi-retinal membrane; PVD posterior vitreous detachment; RD retinal detachment; DM diabetes mellitus; DR diabetic retinopathy; NPDR non-proliferative diabetic retinopathy; PDR proliferative diabetic retinopathy; CSME clinically significant macular edema; DME diabetic macular edema; dbh dot blot hemorrhages; CWS cotton wool spot; POAG primary open angle glaucoma; C/D cup-to-disc ratio; HVF humphrey visual field; GVF goldmann visual field; OCT optical coherence tomography; IOP intraocular pressure; BRVO Branch retinal vein occlusion; CRVO central retinal vein occlusion; CRAO central retinal artery occlusion; BRAO branch retinal artery occlusion; RT retinal tear; SB scleral buckle; PPV pars plana vitrectomy; VH Vitreous hemorrhage; PRP panretinal laser photocoagulation; IVK intravitreal kenalog ; VMT vitreomacular traction; MH Macular  hole;  NVD neovascularization of the disc; NVE neovascularization elsewhere; AREDS age related eye disease study; ARMD age related macular degeneration; POAG primary open angle glaucoma; EBMD epithelial/anterior basement membrane dystrophy; ACIOL anterior chamber intraocular lens; IOL intraocular lens; PCIOL posterior chamber intraocular lens; Phaco/IOL phacoemulsification  with intraocular lens placement; PRK photorefractive keratectomy; LASIK laser assisted in situ keratomileusis; HTN hypertension; DM diabetes mellitus; COPD chronic obstructive pulmonary disease

## 2024-03-15 ENCOUNTER — Encounter (INDEPENDENT_AMBULATORY_CARE_PROVIDER_SITE_OTHER): Payer: Self-pay | Admitting: Ophthalmology

## 2024-03-15 ENCOUNTER — Ambulatory Visit (INDEPENDENT_AMBULATORY_CARE_PROVIDER_SITE_OTHER): Admitting: Ophthalmology

## 2024-03-15 DIAGNOSIS — H34832 Tributary (branch) retinal vein occlusion, left eye, with macular edema: Secondary | ICD-10-CM

## 2024-03-15 DIAGNOSIS — Z961 Presence of intraocular lens: Secondary | ICD-10-CM | POA: Diagnosis not present

## 2024-03-15 DIAGNOSIS — I10 Essential (primary) hypertension: Secondary | ICD-10-CM

## 2024-03-15 DIAGNOSIS — H35033 Hypertensive retinopathy, bilateral: Secondary | ICD-10-CM

## 2024-03-15 MED ORDER — FARICIMAB-SVOA 6 MG/0.05ML IZ SOSY
6.0000 mg | PREFILLED_SYRINGE | INTRAVITREAL | Status: AC | PRN
  Administered 2024-03-15: 18:00:00 6 mg via INTRAVITREAL

## 2024-04-19 NOTE — Progress Notes (Shared)
 " [facility] - Clinic Note  05/03/2024    CHIEF COMPLAINT Patient presents for No chief complaint on file.  HISTORY OF PRESENT ILLNESS: Glenn Hunt. is a 89 y.o. male who presents to the clinic today for:     Patient states   Referring physician: Waylan Cain, MD [address] [place],  [place] [address]  HISTORICAL INFORMATION:   Selected notes from the MEDICAL RECORD NUMBER Referred by Dr. Waylan for HRVO LEE:  Ocular Hx- PMH-    CURRENT MEDICATIONS: No current outpatient medications on file. (Ophthalmic Drugs)   No current facility-administered medications for this visit. (Ophthalmic Drugs)   Current Outpatient Medications (Other)  Medication Sig   acetaminophen  (TYLENOL ) 500 MG tablet Take 1,000 mg by mouth every 8 (eight) hours as needed for moderate pain.   allopurinol  (ZYLOPRIM ) 300 MG tablet Take 300 mg by mouth daily.    cephALEXin  (KEFLEX ) 500 MG capsule Take 1 capsule (500 mg total) by mouth 3 (three) times daily.   ibuprofen (ADVIL) 200 MG tablet Take 600 mg by mouth every 8 (eight) hours as needed for moderate pain.   Multiple Vitamins-Minerals (MULTIVITAMIN ADULT PO) Take 1 tablet by mouth daily.   ondansetron  (ZOFRAN  ODT) 4 MG disintegrating tablet Take 1 tablet (4 mg total) by mouth every 8 (eight) hours as needed for nausea or vomiting.   potassium chloride  (KLOR-CON ) 10 MEQ tablet Take 10 mEq by mouth 2 (two) times daily.   torsemide  (DEMADEX ) 10 MG tablet Take 5 mg by mouth daily as needed (fluid).   triamterene -hydrochlorothiazide  (MAXZIDE -25) 37.5-25 MG tablet Take 1 tablet by mouth daily.   No current facility-administered medications for this visit. (Other)   REVIEW OF SYSTEMS:     ALLERGIES Allergies  Allergen Reactions   Famotidine Other (See Comments)   Tetracycline Hcl Other (See Comments)   Tetracyclines & Related Other (See Comments)    Infection of penis   PAST MEDICAL HISTORY Past Medical History:   Diagnosis Date   Anemia    Arthritis    CAD (coronary artery disease)    2007, inferior ischemia on nuclear scan, catheterization showed  60-70% ostial PDA, 90% distal RCA and a very large right coronary artery ( vision stent was placed 3.5 x 23 mm), 80-90% diagonal ( small vessel, wire could not be passed patient followed medically)   CAD (coronary artery disease)    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; 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 but considers himslef stable , he denies chest pain , headache, sob  , reports he does heavy exercise  states  i work out with a trainer 2-3 times a week and go walking but i havent done much since the gyms closed     Cancer of kidney (HCC)    Creatinine elevation    Dieulafoy lesion of stomach    incidence of bleeds in 2015 and 2017 , required blood transfusion as bleed lef to a 4.5 hemoglobin   Gout    H/O right nephrectomy    History of blood transfusion    Hypertension    Skin cancer    skin , squamous cell    Tuberculosis 1962   Varicose veins of legs    bialtera; ive had them about 5 years now and theyve never been a problem    Past Surgical History:  Procedure Laterality Date   CATARACT EXTRACTION, BILATERAL  2017   COLONOSCOPY  CORONARY ANGIOPLASTY WITH STENT PLACEMENT  2006   ESOPHAGOGASTRODUODENOSCOPY  04/20/2012   Procedure: ESOPHAGOGASTRODUODENOSCOPY (EGD);  Surgeon: Lesta JULIANNA Fitz, MD;  Location: [facility] ENDOSCOPY;  Service: Endoscopy;  Laterality: N/A;   NEPHRECTOMY Right    REPORTS AVG CREATININE 1.4 FOR THE LAST 20 YEARS    REVERSE SHOULDER ARTHROPLASTY Right 02/14/2020   Procedure: REVERSE SHOULDER ARTHROPLASTY;  Surgeon: Sharl Selinda Dover, MD;  Location: [facility];  Service: Orthopedics;  Laterality: Right;  2.5 hrs   TOTAL KNEE ARTHROPLASTY Right 08/26/2018   Procedure: TOTAL KNEE ARTHROPLASTY;  Surgeon: Ernie Cough, MD;  Location: [facility];  Service: Orthopedics;  Laterality: Right;  70 mins   FAMILY  HISTORY No family history on file.  SOCIAL HISTORY Social History   Tobacco Use   Smoking status: Former    Current packs/day: 0.00    Types: Cigarettes    Quit date: 04/01/2021    Years since quitting: 3.0   Smokeless tobacco: Never   Tobacco comments:    7 cigarettes a day fo the last 10 years   Vaping Use   Vaping status: Never Used  Substance Use Topics   Alcohol use: Yes    Alcohol/week: 7.0 standard drinks of alcohol    Types: 7 Glasses of wine per week    Comment: daily    Drug use: No       OPHTHALMIC EXAM:  Not recorded    IMAGING AND PROCEDURES  Imaging and Procedures for 05/03/2024          ASSESSMENT/PLAN:  No diagnosis found.  Superior HRVO w/ CME OS - s/p IVA OS #1 (10.09.23), #2 (11.06.23), #3 (12.04.23), #4 (01.02.24), #5 (01.30.24)  -- IVA resistance ===================== - s/p IVE OS #1 (02.28.24), #2 (03.27.24), #3 (04.24.24), #4 (05.22.24), #5 (06.20.24), #6 (07.19.24)  -- IVE resistance ===================== - s/p IVV OS #1 (08.21.24), #2 (09.18.24), #3 (10.21.24), #4 (11.19.24), #5 (12.17.24), #6 (sample 01.14.25), #7 (02.18.25), #8 (04.03.25), #9 (05.13.25), #10 (06.24.25), #11 (08.05.25), #12 (09.16.25), #13 (11.04.25), #14 (12.16.25) - BCVA OS 20/30 stable - exam shows continued improvement in diffuse retinal hemorrhages -- essentially resolved - OCT shows mild interval improvement in cystic changes temporal fovea; stable improvement in superior macula at 6 weeks (due to holidays) - recommend IVV OS #15 today, 02.03.26 w/ f/u ext back to 7 wks again - RBA of procedure discussed, questions answered - Vabysmo  informed consent obtained and signed OS 08.21.24 - see procedure note - Eylea  prior auth obtained for 2025, Vabysmo  approved for 2025, but [employer] funding unavailable - pt covering 20% coinsurance on medication - F/U in 7 weeks -- DFE/OCT/possible injection  2,3. Hypertensive retinopathy OU - discussed importance of tight BP  control - continue to monitor  4. Pseudophakia OU  - s/p CE/toric IOL OU  - IOLs in good position, doing well  - continue to monitor  Ophthalmic Meds Ordered this visit:  No orders of the defined types were placed in this encounter.    No follow-ups on file.  There are no Patient Instructions on file for this visit.  This document serves as a record of services personally performed by Redell JUDITHANN Hans, MD, PhD. It was created on their behalf by Wanda GEANNIE Keens, COT an ophthalmic technician. The creation of this record is the provider's dictation and/or activities during the visit.    Electronically signed by:  Wanda GEANNIE Keens, COT  04/19/24 7:22 AM  Redell JUDITHANN Hans, M.D., Ph.D. Diseases & Surgery of the Retina  and Vitreous [facility]    Abbreviations: M myopia (nearsighted); A astigmatism; H hyperopia (farsighted); P presbyopia; Mrx spectacle prescription;  CTL contact lenses; OD right eye; OS left eye; OU both eyes  XT exotropia; ET esotropia; PEK punctate epithelial keratitis; PEE punctate epithelial erosions; DES dry eye syndrome; MGD meibomian gland dysfunction; ATs artificial tears; PFAT's preservative free artificial tears; NSC nuclear sclerotic cataract; PSC posterior subcapsular cataract; ERM epi-retinal membrane; PVD posterior vitreous detachment; RD retinal detachment; DM diabetes mellitus; DR diabetic retinopathy; NPDR non-proliferative diabetic retinopathy; PDR proliferative diabetic retinopathy; CSME clinically significant macular edema; DME diabetic macular edema; dbh dot blot hemorrhages; CWS cotton wool spot; POAG primary open angle glaucoma; C/D cup-to-disc ratio; HVF humphrey visual field; GVF goldmann visual field; OCT optical coherence tomography; IOP intraocular pressure; BRVO Branch retinal vein occlusion; CRVO central retinal vein occlusion; CRAO central retinal artery occlusion; BRAO branch retinal artery occlusion; RT retinal tear; SB  scleral buckle; PPV pars plana vitrectomy; VH Vitreous hemorrhage; PRP panretinal laser photocoagulation; IVK intravitreal kenalog ; VMT vitreomacular traction; MH Macular hole;  NVD neovascularization of the disc; NVE neovascularization elsewhere; AREDS age related eye disease study; ARMD age related macular degeneration; POAG primary open angle glaucoma; EBMD epithelial/anterior basement membrane dystrophy; ACIOL anterior chamber intraocular lens; IOL intraocular lens; PCIOL posterior chamber intraocular lens; Phaco/IOL phacoemulsification with intraocular lens placement; PRK photorefractive keratectomy; LASIK laser assisted in situ keratomileusis; HTN hypertension; DM diabetes mellitus; COPD chronic obstructive pulmonary disease "

## 2024-05-03 ENCOUNTER — Encounter (INDEPENDENT_AMBULATORY_CARE_PROVIDER_SITE_OTHER): Payer: Self-pay | Admitting: Ophthalmology

## 2024-05-03 ENCOUNTER — Encounter (INDEPENDENT_AMBULATORY_CARE_PROVIDER_SITE_OTHER): Admitting: Ophthalmology

## 2024-05-03 ENCOUNTER — Ambulatory Visit (INDEPENDENT_AMBULATORY_CARE_PROVIDER_SITE_OTHER): Admitting: Ophthalmology

## 2024-05-03 DIAGNOSIS — H35033 Hypertensive retinopathy, bilateral: Secondary | ICD-10-CM

## 2024-05-03 DIAGNOSIS — H34832 Tributary (branch) retinal vein occlusion, left eye, with macular edema: Secondary | ICD-10-CM | POA: Diagnosis not present

## 2024-05-03 DIAGNOSIS — Z961 Presence of intraocular lens: Secondary | ICD-10-CM

## 2024-05-03 DIAGNOSIS — I10 Essential (primary) hypertension: Secondary | ICD-10-CM | POA: Diagnosis not present

## 2024-05-03 MED ORDER — FARICIMAB-SVOA 6 MG/0.05ML IZ SOSY
6.0000 mg | PREFILLED_SYRINGE | INTRAVITREAL | Status: AC | PRN
  Administered 2024-05-03: 6 mg via INTRAVITREAL

## 2024-06-28 ENCOUNTER — Encounter (INDEPENDENT_AMBULATORY_CARE_PROVIDER_SITE_OTHER): Admitting: Ophthalmology
